# Patient Record
Sex: Female | Born: 1940 | Race: White | Hispanic: No | Smoking: Current every day smoker
Health system: Southern US, Community
[De-identification: ages and names within clinical notes are randomized; demographics above are authoritative.]

## PROBLEM LIST (undated history)

## (undated) DIAGNOSIS — K219 Gastro-esophageal reflux disease without esophagitis: Secondary | ICD-10-CM

## (undated) DIAGNOSIS — M1990 Unspecified osteoarthritis, unspecified site: Secondary | ICD-10-CM

## (undated) DIAGNOSIS — J449 Chronic obstructive pulmonary disease, unspecified: Secondary | ICD-10-CM

## (undated) DIAGNOSIS — F329 Major depressive disorder, single episode, unspecified: Secondary | ICD-10-CM

## (undated) DIAGNOSIS — I639 Cerebral infarction, unspecified: Secondary | ICD-10-CM

## (undated) DIAGNOSIS — F32A Depression, unspecified: Secondary | ICD-10-CM

## (undated) DIAGNOSIS — I509 Heart failure, unspecified: Secondary | ICD-10-CM

## (undated) HISTORY — PX: APPENDECTOMY: SHX54

## (undated) HISTORY — DX: Gastro-esophageal reflux disease without esophagitis: K21.9

## (undated) HISTORY — DX: Unspecified osteoarthritis, unspecified site: M19.90

## (undated) HISTORY — DX: Heart failure, unspecified: I50.9

## (undated) HISTORY — PX: BACK SURGERY: SHX140

## (undated) HISTORY — DX: Depression, unspecified: F32.A

## (undated) HISTORY — DX: Cerebral infarction, unspecified: I63.9

---
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
## 2000-07-18 ENCOUNTER — Emergency Department (HOSPITAL_COMMUNITY): Admission: EM | Admit: 2000-07-18 | Discharge: 2000-07-18 | Payer: Self-pay | Admitting: Emergency Medicine

## 2000-11-26 ENCOUNTER — Observation Stay (HOSPITAL_COMMUNITY): Admission: RE | Admit: 2000-11-26 | Discharge: 2000-11-27 | Payer: Self-pay | Admitting: Specialist

## 2000-11-26 ENCOUNTER — Encounter: Payer: Self-pay | Admitting: Specialist

## 2000-11-26 ENCOUNTER — Encounter (INDEPENDENT_AMBULATORY_CARE_PROVIDER_SITE_OTHER): Payer: Self-pay

## 2001-03-01 ENCOUNTER — Encounter: Payer: Self-pay | Admitting: Specialist

## 2001-03-01 ENCOUNTER — Encounter: Admission: RE | Admit: 2001-03-01 | Discharge: 2001-03-01 | Payer: Self-pay | Admitting: Specialist

## 2001-04-16 ENCOUNTER — Encounter (INDEPENDENT_AMBULATORY_CARE_PROVIDER_SITE_OTHER): Payer: Self-pay | Admitting: Specialist

## 2001-04-16 ENCOUNTER — Inpatient Hospital Stay (HOSPITAL_COMMUNITY): Admission: RE | Admit: 2001-04-16 | Discharge: 2001-04-19 | Payer: Self-pay | Admitting: Specialist

## 2001-04-16 ENCOUNTER — Encounter: Payer: Self-pay | Admitting: Specialist

## 2003-11-25 ENCOUNTER — Encounter: Admission: RE | Admit: 2003-11-25 | Discharge: 2003-11-25 | Payer: Self-pay | Admitting: Neurosurgery

## 2004-01-06 ENCOUNTER — Encounter: Admission: RE | Admit: 2004-01-06 | Discharge: 2004-01-06 | Payer: Self-pay | Admitting: Neurosurgery

## 2004-01-23 ENCOUNTER — Other Ambulatory Visit: Payer: Self-pay

## 2004-02-11 ENCOUNTER — Other Ambulatory Visit: Payer: Self-pay

## 2004-06-09 ENCOUNTER — Inpatient Hospital Stay (HOSPITAL_COMMUNITY): Admission: RE | Admit: 2004-06-09 | Discharge: 2004-06-14 | Payer: Self-pay | Admitting: Neurosurgery

## 2004-12-08 ENCOUNTER — Ambulatory Visit: Payer: Self-pay | Admitting: Internal Medicine

## 2005-01-04 ENCOUNTER — Ambulatory Visit: Payer: Self-pay

## 2005-01-16 ENCOUNTER — Ambulatory Visit: Payer: Self-pay

## 2005-03-06 ENCOUNTER — Ambulatory Visit: Payer: Self-pay | Admitting: Internal Medicine

## 2005-05-22 ENCOUNTER — Encounter: Admission: RE | Admit: 2005-05-22 | Discharge: 2005-06-22 | Payer: Self-pay | Admitting: Neurosurgery

## 2005-05-30 ENCOUNTER — Ambulatory Visit: Payer: Self-pay | Admitting: Internal Medicine

## 2005-06-02 ENCOUNTER — Emergency Department: Payer: Self-pay | Admitting: Emergency Medicine

## 2005-10-04 ENCOUNTER — Emergency Department (HOSPITAL_COMMUNITY): Admission: EM | Admit: 2005-10-04 | Discharge: 2005-10-04 | Payer: Self-pay | Admitting: Emergency Medicine

## 2006-05-03 ENCOUNTER — Emergency Department: Payer: Self-pay | Admitting: Emergency Medicine

## 2006-06-07 ENCOUNTER — Ambulatory Visit: Payer: Self-pay | Admitting: Internal Medicine

## 2006-07-21 ENCOUNTER — Other Ambulatory Visit: Payer: Self-pay

## 2006-07-21 ENCOUNTER — Inpatient Hospital Stay: Payer: Self-pay | Admitting: Internal Medicine

## 2006-10-28 ENCOUNTER — Emergency Department: Payer: Self-pay | Admitting: Unknown Physician Specialty

## 2006-10-28 ENCOUNTER — Other Ambulatory Visit: Payer: Self-pay

## 2006-11-07 ENCOUNTER — Emergency Department: Payer: Self-pay | Admitting: Emergency Medicine

## 2006-11-26 ENCOUNTER — Ambulatory Visit: Payer: Self-pay | Admitting: Internal Medicine

## 2006-12-12 ENCOUNTER — Ambulatory Visit: Payer: Self-pay | Admitting: Internal Medicine

## 2007-03-26 ENCOUNTER — Ambulatory Visit: Payer: Self-pay | Admitting: Neurosurgery

## 2007-07-23 ENCOUNTER — Ambulatory Visit: Payer: Self-pay | Admitting: Pain Medicine

## 2007-07-31 ENCOUNTER — Ambulatory Visit: Payer: Self-pay | Admitting: Pain Medicine

## 2007-08-21 ENCOUNTER — Ambulatory Visit: Payer: Self-pay | Admitting: Pain Medicine

## 2007-09-02 ENCOUNTER — Ambulatory Visit: Payer: Self-pay | Admitting: Pain Medicine

## 2007-09-18 ENCOUNTER — Ambulatory Visit: Payer: Self-pay | Admitting: Pain Medicine

## 2007-10-06 ENCOUNTER — Emergency Department: Payer: Self-pay | Admitting: Internal Medicine

## 2007-10-29 ENCOUNTER — Ambulatory Visit: Payer: Self-pay | Admitting: Pain Medicine

## 2007-11-06 ENCOUNTER — Ambulatory Visit: Payer: Self-pay | Admitting: Pain Medicine

## 2007-11-18 ENCOUNTER — Inpatient Hospital Stay: Payer: Self-pay | Admitting: Internal Medicine

## 2007-11-18 ENCOUNTER — Other Ambulatory Visit: Payer: Self-pay

## 2007-11-26 ENCOUNTER — Ambulatory Visit: Payer: Self-pay | Admitting: Pain Medicine

## 2008-02-14 ENCOUNTER — Ambulatory Visit: Payer: Self-pay | Admitting: Specialist

## 2008-02-21 ENCOUNTER — Ambulatory Visit: Payer: Self-pay | Admitting: Specialist

## 2008-02-24 ENCOUNTER — Ambulatory Visit: Payer: Self-pay | Admitting: Specialist

## 2008-02-24 ENCOUNTER — Other Ambulatory Visit: Payer: Self-pay

## 2008-02-26 ENCOUNTER — Ambulatory Visit: Payer: Self-pay | Admitting: Specialist

## 2008-03-03 ENCOUNTER — Emergency Department: Payer: Self-pay | Admitting: Emergency Medicine

## 2008-08-11 ENCOUNTER — Ambulatory Visit: Payer: Self-pay | Admitting: Internal Medicine

## 2009-01-03 ENCOUNTER — Emergency Department: Payer: Self-pay | Admitting: Emergency Medicine

## 2009-04-15 ENCOUNTER — Ambulatory Visit: Payer: Self-pay | Admitting: Anesthesiology

## 2009-07-13 ENCOUNTER — Emergency Department: Payer: Self-pay | Admitting: Unknown Physician Specialty

## 2009-07-15 ENCOUNTER — Emergency Department: Payer: Self-pay | Admitting: Unknown Physician Specialty

## 2009-11-06 ENCOUNTER — Emergency Department: Payer: Self-pay | Admitting: Emergency Medicine

## 2009-12-01 ENCOUNTER — Ambulatory Visit: Payer: Self-pay | Admitting: Internal Medicine

## 2011-02-24 NOTE — Op Note (Signed)
[facility]  Patient:    Jennifer Hayes, Jennifer Hayes             MRN: [id number] Proc. Date: 04/16/01 Adm. Date:  [id number] Attending:  Pierce Crane                           Operative Report  PREOPERATIVE DIAGNOSES:  Recurrent herniated nucleus pulposus L5-S1, left, lateral recessed stenosis and herniated nucleus pulposus L4-5, left.  PROCEDURE:  Redo microdiskectomy L5-S1, left, hemilaminotomy, microdiskectomy L4-5, left, hemilaminectomy l5, foraminotomies L5 and S1.  ANESTHESIA:  General.  ASSISTANT:  Kerrin Champagne, M.D.  BRIEF HISTORY:  A 70 year old with a history of diskectomy, recurrent S1, L5 radicular pain confirmed on myelogram and its filling of the 5 and S1 nerve roots. Extension indicated protrusion of the disk with stenosis. Operative intervention was indicated for decompression at 2 levels by diskectomy and lateral recess, decompression. The risks and benefits discussed including bleeding, infection, damage to neurovascular structures, CSF leakage, epidural fibrosis and need for fusion in the future, etc.  TECHNIQUE:  The patient in the supine position after induction of adequate general anesthesia and 1 gm of Kefzol IV. The patient placed prone on the Andrews frame, all bony prominences well padded, lumbar region prepped and draped in the usual sterile fashion. The previous surgical scar was excised. The excision was extended cephalad. First the lumbar fascia identified and divided in line with the skin incision. The paraspinous muscle elevated from the lamina of 4-5 and S1 on the left. McCullough retractor was placed. The operating microscope was draped and brought in the surgical field, a confirmatory radiograph obtained. Kochers on the 4 and 5 spinous processes. Care was utilized to detach the epidural fibrosis from the lamina of 5 and of S1. Small interlaminar space at 4-5. There was a small transverse remnant of the  neural arch at L5. A hemilaminotomy was performed at 4-5 with a large facet noted and a medial hemifacetectomy was performed utilizing the high speed bur removing the ligamentum flavum from the interspace with severe lateral recessed stenosis. Foraminotomy was performed completing the resection of the neural arch at L5. The lateral recess was further decompressed with a 2 mm Kerrison with the ligamentum flavum reduced as well. A Penfield 4 was utilized to identify the L5 nerve root and it was gently mobilized medially. The large HNP at 4-5 noted and soft. Annulotomy was performed and thorough diskectomy performed with no residual herniated disk material following the diskectomy. A hockey stick probed piercing the foramen of 4 and 5 and found to be widely patent. Next the lateral recess was decompressed at 5-1. Epidural fibrosis was mobilized utilizing a combination of the nerve hook and a small straight curette. The nerve root was identified and the foramen of S1 gently mobilized medially. There was an extruded fragment noted at 5-1 noted as well and annulotomy was performed. A copious portion of disk material was removed from the disk space and no evidence of residual disk material was noted. Both disk spaces were copiously irrigated. The nerve roots of 4, 5 and S1 were examined and found to be intact, erythematous, and edematous. Good mobilization following the diskectomy and the lateral recessed decompression. Bipolar electrocautery was utilized to achieve strict hemostasis. No evidence of CSF leakage or active bleeding. Thrombin soaked Gelfoam was placed in the laminotomy defects, bone wax was placed over the cancellous bone surfaces. The McCullough retractor was removed. Paraspinous  muscles inspected, no evidence of active bleed. A Penrose drain was placed and brought out through a separate stab wound in the skin. The dorsal lumbar fascia reapproximated with #1 Vicryl interrupted  figure-of-eight sutures. The subcutaneous tissue reapproximated with 2-0 Vicryl simple sutures. The skin was reapproximated with staples. The wound was dressed sterilely. The patient was placed supine on the hospital bed, extubated without difficulty and transported to the recovery room in satisfactory condition.  The patient tolerated the procedure well with no complications. Blood loss was 50 cc. DD:  04/16/01 TD:  04/17/01 Job: [id number]

## 2011-02-24 NOTE — H&P (Signed)
[facility]  Patient:    Jennifer Hayes, Jennifer Hayes               MRN: [id number] Adm. Date:  04/16/01 Attending:  Javier Docker, M.D. Dictator:   Druscilla Brownie. Shela Nevin, P.A.                         History and Physical  DATE OF BIRTH:  [birth year]  CHIEF COMPLAINT:  "Pain in my left leg."  HISTORY OF PRESENT ILLNESS:  A 70 year old lady who sustained an injury in the fall of 2001 when she was lifting a box. She had pain into her back and numbness into the left leg. She was eventually seen by [place]. Conservative care consisted of selective nerve root blocks as well as anti-inflammatories and analgesics. The patient underwent microdiskectomy on the left at L5-S1 on November 26, 2000. She did fairly well for a short time after her surgery but unfortunately her pain returned and became more intense into the left lower extremity. She now has developed with an antalgic gait and has a mood change. On the CT myelogram (active), the patient has disk protrusion at L4-5 with narrowing of the neural canal. Also noted to be a decreased filling of the left S1 nerve root. Due to these positive findings and the fact that unfortunately this patients pain has returned with the findings on CT myelogram and MRI, it was felt that she would benefit with surgical intervention with microdiskectomy at L4-5 with lateral recessed decompression at L4-5 and L5-S1.  PAST MEDICAL HISTORY:  The patients really been in good health. She had the microdiskectomy as mentioned above and in February of 2002, she had an appendectomy, exploratory laparotomy, and cervical fusion in the past.  CURRENT MEDICATIONS:  Vioxx and hydrocodone.  ALLERGIES:  She has some nausea with DARVON.  SOCIAL HISTORY:  The patient smokes about a pack of cigarettes per day (will try to cut back) and has 2-3 alcoholic beverages per month. She is married.  FAMILY HISTORY:  Noncontributory.  REVIEW OF  SYSTEMS:  CNS:  No seizure disorder, paralysis, numbness, or double vision. RESPIRATORY:  No shortness of breath, productive cough, hemoptysis. CARDIOVASCULAR:  No chest pain, no angina, no orthopnea. GASTROINTESTINAL:  No nausea, vomiting, melena, or bloody stools. GENITOURINARY:  No discharge, dysuria, or hematuria. MUSCULOSKELETAL:  Primarily with left leg pain and discomfort.  PHYSICAL EXAMINATION:  GENERAL:  Alert, cooperative, and friendly, somewhat anxious 70 year old white female whose vital signs are:  VITAL SIGNS:  Blood pressure 128/68, pulse 72, respirations 20. She is accompanied by her husband.  HEENT:  Normocephalic. Pupils equal round and reactive to light and accommodation. Extraocular movements intact. Oropharynx is clear.  CHEST:  Clear to auscultation. No rhonchi or rales.  HEART:  Regular rate and rhythm. No murmurs are heard.  ABDOMEN:  Soft and nontender. Liver and spleen are not felt.  GENITALIA/RECTAL/PELVIC/BREASTS:  Not done not pertinent to the present illness.  EXTREMITIES:  The patient has a positive straight leg raise on the left which produces buttock, posterior thigh and calf pain. It is exacerbated with dorsal augmentation maneuver. No weakness is noted.   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  PLAN:  The patient will undergo microdiskectomy at L4-5 with a lateral recessed decompressed decompression at L4-5, L5-S1. DD:  04/10/01 TD:  04/10/01 Job: [id number]

## 2011-02-24 NOTE — Discharge Summary (Signed)
Jennifer Hayes, SZE NO.:  [id number]   MEDICAL RECORD NO.:  [id number]          PATIENT TYPE:  INP   LOCATION:  3041                         FACILITY:  [facility]   PHYSICIAN:  Cristi Loron, M.D.DATE OF BIRTH:  [birth year]   DATE OF ADMISSION:  06/09/2004  DATE OF DISCHARGE:  06/14/2004                                 DISCHARGE SUMMARY   HISTORY OF PRESENT ILLNESS:  Briefly, the patient is a 70 year old white  female who was involved in a work-related injury suffering back and leg  pain.  She has failed medical management, was worked up with a lumbar MRI  which demonstrated the patient had degenerative disc disease at L4-L5 and L5-  S1.  She has failed extensive non surgical management and was treated with  multiple modalities.  The patient was further worked up with a lumbar  discogram which demonstrated she had concordant pain and degenerative disc  disease at L4-L5 and L5-S1.  I discussed the various treatment options with  the patient including surgery.  The patient has weighed the risks, benefits  and alternatives to surgery and she decided to proceed with a lumbar  decompression and fusion.  Patient has undergone previous surgery at L4-L5  and L5-S1 X2 by another physician.   For further details of this admission please refer to the typed history and  physical.   HOSPITAL COURSE:  I admitted the patient to [facility] on June 09, 2004 and that day performed an L4-L5 and L5-S1 fusion.  The surgery went  well without complications.  (For full details of this procedure, please  refer to the typed operative note).   Postoperatively, the patient's course was more-or-less unremarkable.  We did  initially have some trouble with pain control as she was narcotic-tolerant,  started on OxyContin 40 mg b.i.d. and her pain improved nicely.  We had  physical therapy and occupational therapy see the patient and by June 14, 2004 the patient was  afebrile, vital signs were stable, she was eating  well, ambulating well, wound was healing well without signs of infection.  Her pain was under adequate control with p.o. medications and she was  requesting discharge to home.   DISCHARGE INSTRUCTIONS:  Patient was given written discharge instructions  and is instructed to follow up with me in four weeks.   DISCHARGE MEDICATION PRESCRIPTIONS:  1.  OxyContin 20 mg #60, one p.o. b.i.d.  2.  Percocet 10/325 #50, one p.o. q.6h. PRN for break through pain.  3.  Valium 5 mg #50, one p.o. q.6h. PRN for muscle spasms.  4.  Patient is to resume her outpatient medical regimen.   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   PROCEDURE PERFORMED:  Re-do L4-L5 laminectomy; L4-L5 and L5-S1 posterior  lumbar interbody fusions, insertion of bilateral L4-L5 and L5-S1 interbody  prostheses (PEEK cages).  Posterior segmental instrumentation with Legacy  titanium pedicle screws and rods bilaterally, L4 to S1; posterior lateral  arthrodesis with local morselized Autograft bone and VTOS mode scaffolding.       JDJ/MEDQ  D:  07/07/2004  T:  07/08/2004  Job:  [id number]

## 2011-02-24 NOTE — H&P (Signed)
[facility]  Patient:    Jennifer Hayes, Jennifer Hayes                  MRN: [id number] Adm. Date:  11/26/00 Attending:  Javier Docker, M.D. Dictator:   Javier Docker, M.D.                         History and Physical  DATE OF OFFICE VISIT HISTORY AND PHYSICAL:  November 23, 2000.  CHIEF COMPLAINT:  Left buttock pain and leg pain.  HISTORY OF PRESENT ILLNESS:  Patient is a 70 year old female who has been seen and evaluated by Dr. Javier Docker for buttock pain and left lower extremity pain radiating down to the dorsum of her foot.  She was referred over per Dr. Caralyn Guile. Ramos.  She was initially injured at work lifting a 35-pound box and felt the acute onset of low back pain and numbness extending down into her leg.  After being seen in the emergency room, she was subsequently seen by Dr. Vania Rea. Supple and treated conservatively, underwent MRI which demonstrated degenerative disk disease at multiple levels in the lumbar spine and also some paracentral disk protrusion at L5-S1.  She was referred over to Dr. Ethelene Hal and underwent selective nerve root block which only gave her temporary relief.  She underwent intradiscal steroid injections without relief.  She has reported the pain has been worsening.  She was referred over and evaluated by Dr. Shelle Iron for possible surgical intervention. She was seen and evaluation, risks and benefits of the procedure have been discussed with the patient and she has elected to proceed with surgery.  ALLERGIES:  DARVON causes nausea.  CURRENT MEDICATIONS 1. Vicodin 5 mg p.r.n. pain. 2. Celebrex 200 mg. 3. Ambien 5 mg for sleep.  PAST MEDICAL HISTORY:  Essentially negative.  PAST SURGICAL HISTORY:  Cervical fusion; appendectomy; exploratory laparotomy.  FAMILY HISTORY:  Mother living with history of diabetes.  Father deceased, cause is unknown.  SOCIAL HISTORY:  Married.  Approximately two to three drinks a month  of alcohol, ________________.  One-pack-per-day smoker.  REVIEW OF SYSTEMS:  GENERAL:  No fevers, chills or night sweats.  NEUROLOGIC: No seizures, syncope or paralysis.  Patient does have some radiation of pain and paresthesias down the left lower extremity.  RESPIRATORY:  No shortness of breath, productive cough or hemoptysis.  Patient is a smoker of one pack per day.  CARDIOVASCULAR:  No chest pain, angina or orthopnea.  GI:  No nausea, vomiting, diarrhea or constipation.  GU:  No dysuria, hematuria or discharge. MUSCULOSKELETAL:  Pertinent to that of the back and leg found in the history of present illness.  PHYSICAL EXAMINATION  VITAL SIGNS:  Pulse 70, respirations 18, blood pressure 90/78.  GENERAL:  Patient is a 70 year old female, well-nourished, well-developed, and appears to be in no acute distress.  She is alert, oriented and cooperative.  HEENT:  Normocephalic, atraumatic.  Pupils are round and reactive.  Oropharynx is clear.  NECK:  Supple.  No carotid bruits.  CHEST:  Clear to auscultation and percussion.  No rhonchi or rales.  HEART:  Heart in regular rate and rhythm.  No murmurs.  S1 and S2 noted.  ABDOMEN:  Soft abdomen, slightly round, not protuberant.  Bowel sounds are present.  Nontender.  RECTAL:  Not done, not pertinent to present illness.  BREASTS:  Not done, not pertinent to present illness.  GENITALIA:  Not done, not pertinent  to present illness.  EXTREMITIES:  Pelvis is stable.  Nontender over the trochanter of the left hip.  Straight leg raise positive on the left and produces buttock and posterior thigh pain, exacerbated with dorsal augmentation maneuver.  EHL 5/5 on the left.  Slight decreased sensation on the dorsum of the foot.  Right leg straight leg raise is negative.  IMPRESSION:  Herniated nucleus pulposus, L5-S1.  PLAN:  Patient will be admitted to [facility] to undergo microdiskectomy and foraminotomy of L5-S1 on the left.   Surgery will be performed by Dr. Jene Every. DD:  11/28/00 TD:  11/29/00 Job: [id number]

## 2011-02-24 NOTE — Op Note (Signed)
[facility]  Patient:    Jennifer Hayes, Jennifer Hayes                  MRN: [id number] Proc. Date: 11/26/00 Adm. Date:  [id number] Disc. Date: [id number] Attending:  Pierce Crane                           Operative Report  PREOPERATIVE DIAGNOSIS:  Herniated nucleus pulposus, foraminal stenosis L5-S1 left.  POSTOPERATIVE DIAGNOSIS:  Herniated nucleus pulposus, foraminal stenosis L5-S1 left.  OPERATION: 1. Microdiskectomy L5-S1 left. 2. Foraminotomy L5 left.  SURGEON:  Javier Docker, M.D.  ASSISTANT:  Georges Lynch. Darrelyn Hillock, M.D.  ANESTHESIA:  General.  BRIEF HISTORY AND INDICATIONS:  A 70 year old with refractory S1 radicular pain and L5 radicular pain secondary to HNP and foraminal stenosis at L5-S1. Operative intervention was indicated for decompression by diskectomy and foraminotomy.  Risks and benefits of the procedure were discussed including bleeding, infection, damage to neurovascular structures, CSF leakage, epidural fibrosis, epidural fibrosis, no change in symptoms, etc.  TECHNIQUE:  The patient was placed in the supine position after induction of adequate general anesthesia with 1 g Kefzol for antibiotic prophylaxis.  The patient was placed prone on the [place] frame.  All bony prominences were well padded.  The lumbar region was prepped and draped in the usual sterile fashion.  One 18-gauge spinal needle was utilized to localize the L5-S1 interspace and confirmed with x-ray.   Electrocautery was utilized to achieve hemostasis.   Decision was then made after infiltration with 0.25% Marcaine with epinephrine in subcutaneous tissue from the spinous process of L5 to S1. Subcutaneous tissue was dissected.  The dorsal lumbar fascia was identified and divided in line with the skin incision.   McCullough retractor was placed. Paraspinous muscles were gently elevated from the lamina of L5-S1 on the left. Confirmatory radiograph obtained.   Operating microscope was draped and brought into the surgical field.  Hemilaminotomy of L5 was performed with a 2 mm and 3 mm Kerrison.   Ligamentum flavum was detached from the cephalad edge of S1 utilizing a straight curet.  This was removed from the interspace with protecting neural elements at all times.   An HNP that was noted laterally and into the foramen was noted.  Neural elements were well protected and gently mobilized medially.  Annulotomy was performed.  Copious portions of disk material was removed from the disk space with extension into the foramen. This was procured with a neural hook followed by a Kerrison.  There was extension of the 5 root noted as well.  With the 5 root protected, a foraminotomy was performed with the 2 mm Kerrison at L5 and at S1, a small portion of the superior articular facet which was narrowing L5 foramen. Bipolar cautery was utilized to achieve hemostasis.  The disk space was copiously irrigated.  Hockey stick probe placed in the foramen of 5 and S1 and found to be widely patent.  S1 nerve root inspected as well as the L5.  They were found to be without evidence of compression.  There was good mobility of the nerve roots noted following the decompression.  Again, wound copiously irrigated.  Inspection revealed no back bleeding or CSF leakage.  THe operating microscope was removed.  The McCullough retractor was removed. Paraspinous muscles inspected with no bleeding.  Thrombin-soaked Gelfoam was placed in laminotomy defect.  Dorsal lumbar fascia was reapproximated with #1 Vicryl in figure-of-eight sutures.  Subcutaneous tissue was reapproximated with 2-0 Vicryl sutures.  Skin was reapproximated with a 3-0 subcuticular Prolene reinforced with Steri-Strips and sterile dressing.  The patient was placed supine on the hospital bed, extubated without difficulty, and transported to the recovery room in satisfactory condition.  The patient tolerated the  procedure well without complication. DD:  11/26/00 TD:  11/27/00 Job: [id number]

## 2011-02-24 NOTE — Op Note (Signed)
[facility]  Patient:    Jennifer Hayes, Jennifer Hayes                  MRN: [id number] Proc. Date: 11/26/00 Adm. Date:  [id number] Disc. Date: [id number] Attending:  Pierce Crane                           Operative Report  PREOPERATIVE DIAGNOSES:  Herniated nucleus pulposus, foraminal stenosis L5-S1 left.  POSTOPERATIVE DIAGNOSES:  Herniated nucleus pulposus, foraminal stenosis L5-S1 left.  OPERATIVE PROCEDURE:  Microdiskectomy L5-S1 left, foraminotomy L5 left.  ANESTHESIA:  General.  ASSISTANT:  Georges Lynch. Darrelyn Hillock, M.D.  INDICATIONS:  70 year old with refractory S1 radicular pain and L5 radicular pain secondary to HNP and foraminal stenosis at L5-S1. Operative intervention was indicated for decompression by diskectomy and foraminotomy. The risks and benefits discussed including bleeding, infection, damage to vascular structures, CSF leak, attributorial fibrosis, no changes in symptoms, etc.  DESCRIPTION OF PROCEDURE:  With the patient in supine position and after induction of adequate general anesthesia, 1 g of Kefzol given for antimicrobial prophylaxis, the patient was placed prone on the Andrews frame and all bony prominences were well padded. The lumbar region was prepped and draped in the usual sterile fashion. One #18 gauge spinal needle was utilized to localize the L5-S1 inner space and confirmed with x-ray.  The incision was made from the spinous process of L5 to S1, the subcutaneous tissue dissected and electrocautery used to achieve hemostasis. Marcaine 0.25% with epinephrine was infiltrated in the subcutaneous tissue. The dorsolumbar fascia was identified and divided in line with the skin incision. A McCullough retractor retractor was placed, the paraspinous muscles were elevated to the lamina of L5 and S1. Confirmatory radiograph obtained. The operating microscope was draped and drawn into the surgical field. A hemilaminotomy  of L5 was performed with 2 and a 3 mm Kerrison. The ligamentum flavum was detached from the cephalad edge of S1 utilizing a straight curet. This was removed from the inner space with neuropaddies protecting the neural elements at all times. An HNP that was noted laterally and into the foramen was noted with the neural elements well protected. I gently mobilized medially. An annulotomy was performed. Copious coarse disk material was removed from the disk space with an extension into the foramen. This was procured with a nerve hook, followed by a Kerrison. There was an extension in the L5 root noted as well. With the L5 root protected, a foraminotomy was performed with a 2 mm Kerrison at L5 and at S1 with a small portion of the superior articuli and facet which was narrowing the L5 foramen. Bipolar cautery was utilized to achieve hemostasis. The disk space was copiously irrigated. A hockey stick probe was placed in the foramen of L5 and S1 and found to be widely patent. The S1 nerve root was inspected as well as the L5; they were found to be without evidence of compression. There was good mobility of the nerve roots noted following the decompression. Again, the wound was copiously irrigated and inspection revealed no evidence of active bleeding or CSF leakage. The operating microscope was removed, the McCullough retractor was removed and the paraspinous muscles were inspected with no evidence of active bleeding. Thrombin soaked Gelfoam was placed in the laminotomy defect. The dorsolumbar fascia reapproximated with #1 Vicryl in interrupted figure-of-eight sutures, the subcutaneous tissue reapproximated with 2-0 Vicryl simple sutures, and the skin was reapproximated with  3-0 subcuticular Prolene. The wound was reinforced with Steri-Strips and a sterile dressing. The patient was placed supine on the hospital bed, extubated without difficulty and transported to the recovery room in stable  condition. The patient tolerated the procedure well. There were no complications. DD:  11/26/00 TD:  11/27/00 Job: [id number]

## 2011-02-24 NOTE — Discharge Summary (Signed)
[facility]  Patient:    Jennifer Hayes, Jennifer Hayes             MRN: [id number] Adm. Date:  [id number] Disc. Date: [id number] Attending:  Pierce Crane Dictator:   Irena Cords, P.A.-C CC:         Javier Docker, M.D.   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  SECONDARY DIAGNOSES:  None.  SURGICAL PROCEDURE:  Redo micro discectomy L5-S1, left, hemilaminotomy, micro discectomy L4-L5, left, hemilaminectomy L5, foraminotomies L5 and S1 by Dr. Jene Every with the assistance of Dr. Otelia Sergeant on April 16, 2001. Please see operative summary for further details.  CONSULTATIONS:  None.  LABORATORY DATA:  Postoperative hemoglobin was 11.8, hematocrit 33.9 and then repeated later that day was 11.1 and 32.0, respectively. Her preoperative hemoglobin was 13.6. Preoperative PT and INR were 12.6 and 0.9, respectively. Chem 7 prior to surgery was all within normal limits with a sodium of 140 and a potassium of 5.0. Creatinine was 0.8. On postoperative day #1, basic metabolic profile was again within normal limits with a sodium of 138, potassium 3.7, and creatinine of 0.7. Urinalysis prior to surgery was all within normal limits.  SURGICAL PATHOLOGY:  Fragments of soft tissue consistent with fibrocartilage, clinically disc material.  CHIEF COMPLAINT:  Pain in my left leg.  HISTORY OF PRESENT ILLNESS:  Jennifer Hayes is a 70 year old female who had sustained an injury to her back in the fall of 2001 while she was lifting a box. She has undergone conservative treatment with various anti-inflammatory medications, analgesics, and selective nerve root blocks. She has not seen much relief. She underwent a micro discectomy on the left on L5-S1 in February 2002. She did well for a short period of time but then symptoms of pain did reoccur.  A CT myelogram did show recurrent disc protrusion L4-L5. Because of the patients continued discomfort and findings, it was recommended she may benefit from redo surgical intervention. Risk, benefits, and alternatives of surgery were discussed with the patient. She voiced an understanding and wished to proceed with surgery. Preoperative laboratory studies and signed consents were obtained.  HOSPITAL COURSE:  Following surgical procedure, the patient was taken to the PACU in stable condition and transferred to the orthopedic floor in good condition. She did well during her hospital stay. She was up with physical therapy on postoperative day #1, weightbearing as tolerated, and with back care. She did well and progressed well with therapy. She gained independence fairly quickly. She had some hypotension postoperatively. Her systolic pressures were in the 80s to 90s. She was asymptomatic, though, with these pressures. She was given a fluid challenge and really did not change her pressures all that much. It should be noted that her preoperative pressures were low as well with systolic pressures in the low 21H to around 100.  On postoperative day #1, her hemoglobin was ___. This remained as stable later in the day, as it was rechecked, it was ___. Incision was examined on postoperative day #2 and subsequently found to be clean, dry and intact without signs or symptoms of infection. The patient was initially on morphine IV for pain management and then switched over to Lorcet p.o. which she tolerated well and controlled her pain well. She was given six doses of Kefzol 1 g IV postoperatively. By postoperative day #3, she was stable and ready for discharge home.  DISCHARGE DISPOSITION:  The patient will be discharged home. She is to resume a regular diet. She is to followup with Dr. Shelle Iron in seven to ten days. She is to call [phone] for an appointment. Activity is weightbearing as tolerated. No  bending, twisting, stooping, or heavy lifting. Continue to follow back care as instructed by the therapist. Once daily dressing changes. May being showering on postoperative day #5.  DISCHARGE MEDICATIONS: 1. Vioxx 25 mo p.o. q.d. with food. 2. Robaxin 500 mg p.o. q.8h. p.r.n. spasm. 3. Lorcet 10/650 one p.o. q.4-6h. p.r.n. pain. 4. Vitamin C 500 mg p.o. q.d.  CONDITION ON DISCHARGE:  Good and improved. DD:  04/19/01 TD:  04/19/01 Job: [id number]

## 2011-02-24 NOTE — Op Note (Signed)
NAME:  Jennifer Hayes, Jennifer Hayes                ACCOUNT NO.:  [id number]   MEDICAL RECORD NO.:  [id number]                   PATIENT TYPE:  INP   LOCATION:  3041                                 FACILITY:  [facility]   PHYSICIAN:  Cristi Loron, M.D.            DATE OF BIRTH:  1941/02/07   DATE OF PROCEDURE:  06/09/2004  DATE OF DISCHARGE:                                 OPERATIVE REPORT   BRIEF HISTORY:  The patient is a 70 year old white female who was involved  in a work related injury suffering back and leg pain.  She failed medical  management and was worked up with a lumbar MRI which demonstrated that the  patient had degenerative disk disease at L4-5 and L5-S1.  She failed  extensive non-surgical management and was treated with multiple modalities.  The patient was further worked up with a lumbar diskogram which demonstrated  that she had concordant pain and degenerated disk at L4-5 and L5-S1.  I  discussed the various treatment options with the patient including surgery.  The patient has weighed the risks, benefits and alternatives of surgery and  decided to proceed with a lumbar decompression and fusion.  The patient has  undergone previous surgery at L4-5 and L5-S1 times two by another physician.   PREOPERATIVE DIAGNOSIS:  L4-5 and L5-S1 degenerative disk disease, lumbago  with lumbar radiculopathy.   POSTOPERATIVE DIAGNOSIS:  L4-5 and L5-S1 degenerative disk disease, lumbago  with lumbar radiculopathy.   PROCEDURE:  Redo laminectomy at L4 and L5; L4-5 and L5-S1 posterior lumbar  interbody fusion; insertion of bilateral L4-5 and L5-S1 interbody prostheses  (Capstone Peek cages); posterior segmental instrumentation with Legacy  titanium peg screws and rods bilaterally L4 to S1; posterolateral  arthrodesis with local morselized autograft bone and Vitoss bone  scaffolding.   SURGEON:  Cristi Loron, M.D.   ASSISTANT:  Danae Orleans. Venetia Maxon, M.D.   ANESTHESIA:  General  endotracheal anesthesia.   ESTIMATED BLOOD LOSS:  500 mL.   SPECIMENS:  None.   DRAINS:  None.   COMPLICATIONS:  None.   DESCRIPTION OF PROCEDURE:  The patient was brought to the operating room by  the anesthesia team and general endotracheal anesthesia was induced.  The  patient was turned to the prone position on the Wilson frame and the  lumbosacral region was then prepared with Betadine scrub and Betadine  solution.  Sterile drapes were applied.  I then injected the area to be  incised with Marcaine with epinephrine solution and used a scalpel to make a  linear midline incision at the lumbosacral junction ellipsing out the  patient's prior surgical scar.  I used electrocautery to perform a bilateral  subperiosteal dissection exposing the spinous process and lamina of L3, 4, 5  and upper sacrum. I inserted a Versatack retractor for exposure and I  obtained an intraoperative radiograph to confirm our location.   We began on the left side, i.e., the side she previously had surgery  on.  We  used a high speed drill to expose the L4 and L5 lamina.  We used the high  speed drill to extend the prior laminotomy in a cephalad direction until we  encountered some relatively non-scarred down epidural space.  We then  performed the L4 and L5 laminectomy with a Kerrison punch removing the L3-4,  4-5 and 5-1 ligamentum flavum (what was left of it) and the epidural scar  tissue.  We then performed generous foraminotomies about the bilateral L4,  L5 and S1 nerve root.  We carefully freed up the thecal sac from the  epidural tissue and the scar tissue on the left and then carefully retracted  all structures out of harm's way with the retractor, we incised the L4-5 and  L5-S1 intervertebral disk and performed an aggressive diskectomy  bilaterally.   We then prepared the vertebral end plates for a posterior lumbar interbody  fusion by distracting the contralateral interspace with the  intervertebral  body spreader and then we created ipsilateral disk space of soft tissue  using the Epstein curettes and the hook curettes.  We had inserted an 8 x 26  mm Capstone Peek cage into the distracted L5-S1 interspace and then removed  the structure from the contralateral side and placed another similar sized  Capstone cage into the contralateral side.  In a similar fashion, we placed  10 x 26 mm Capstone Peek cages in at L4-5 after distracting the interspace.  Of course, we did this after retracting all structures out of harm's way.  We had pre-packed the cages with local morselized autograft bone and the  Vitoss bone scaffolding as well as in between lateral to the cages  completing the posterior interbody fusion.   We now turned our attention to the posterior segmental instrumentation. We  used the Versatack retractor to expose the lateral transverse process of L4-  5 and the upper sacrum. We then under fluoroscopic guidance cannulated the  bilateral L4, 5 and S1 pedicles with the bone probes.  We then tapped the  pedicles and tried to tap the pedicles to avoid cortical breaches and then  inserted 6.5 x 45 mm pedicle screws bilaterally at L4 and L5, 6.5 x 40 mm  pedicle screws bilaterally at S1.  We did this under fluoroscopic guidance  and then we palpated along the medial aspect of the bilateral L4-5 and S1  pedicles and noted there were no cortical breaches at the L4, 5 and S1 nerve  roots were not injured in any way. We then connected all the pedicle screws  with a lordotic rod.  We then placed the cross connector.  We compressed the  construct and then secured the rod with the appropriate caps completing the  instrumentation.   We now turned our attention to the posterolateral arthrodesis. We used a  high speed drill to decorticate the remainder of the L4-5 and L5-S1 facet joints, pars and transverse processes.  We laid a combination of local  morselized autograft bone  and Vitoss scaffolding over these decorticated  posterolateral structures completing the posterolateral arthrodesis.   We then obtained meticulous hemostasis using bipolar electrocautery.  We  inspected the thecal sac and bilateral L4, 5 and S1 nerve roots and noted  that there was no neural compression. We removed the Versatack retractor and  then reapproximated the thoracolumbar fascia with interrupted #1 Vicryl  suture, the subcutaneous tissues with interrupted 2-0 Vicryl suture and the  skin with Steri-Strips and Benzoin.  The wound was then covered with  Bacitracin ointment, a sterile dressing was applied and the drape was  removed.  The patient was subsequently returned to the supine position where  she was extubated by the anesthesia team and transported to the post-  anesthesia care unit in stable condition. All sponge, instrument and needle  counts were correct at the end of the case.                                               Cristi Loron, M.D.    JDJ/MEDQ  D:  06/09/2004  T:  06/10/2004  Job:  [id number]

## 2011-04-30 ENCOUNTER — Inpatient Hospital Stay: Payer: Self-pay | Admitting: Internal Medicine

## 2012-08-14 ENCOUNTER — Ambulatory Visit: Payer: Self-pay | Admitting: Internal Medicine

## 2013-02-11 ENCOUNTER — Emergency Department: Payer: Self-pay | Admitting: Emergency Medicine

## 2013-03-11 ENCOUNTER — Emergency Department: Payer: Self-pay | Admitting: Emergency Medicine

## 2013-03-11 LAB — URINALYSIS, COMPLETE
Bacteria: NONE SEEN
Blood: NEGATIVE
Nitrite: NEGATIVE
RBC,UR: 1 /HPF (ref 0–5)
Specific Gravity: 1.019 (ref 1.003–1.030)
Squamous Epithelial: NONE SEEN

## 2013-03-11 LAB — CBC
HCT: 34.6 % — ABNORMAL LOW (ref 35.0–47.0)
HGB: 12.1 g/dL (ref 12.0–16.0)
MCH: 30.3 pg (ref 26.0–34.0)
MCHC: 34.8 g/dL (ref 32.0–36.0)
MCV: 87 fL (ref 80–100)
RBC: 3.98 10*6/uL (ref 3.80–5.20)

## 2013-03-11 LAB — COMPREHENSIVE METABOLIC PANEL
Albumin: 3.3 g/dL — ABNORMAL LOW (ref 3.4–5.0)
Anion Gap: 5 — ABNORMAL LOW (ref 7–16)
Chloride: 110 mmol/L — ABNORMAL HIGH (ref 98–107)
EGFR (Non-African Amer.): >60
Potassium: 3.5 mmol/L (ref 3.5–5.1)
SGOT(AST): 16 U/L (ref 15–37)
Sodium: 143 mmol/L (ref 136–145)

## 2013-03-11 LAB — TROPONIN I: Troponin-I: <0.02 ng/mL

## 2013-07-11 ENCOUNTER — Ambulatory Visit: Payer: Self-pay

## 2013-11-27 ENCOUNTER — Ambulatory Visit: Payer: Self-pay

## 2013-12-06 ENCOUNTER — Emergency Department: Payer: Self-pay | Admitting: Emergency Medicine

## 2014-11-20 DIAGNOSIS — M5030 Other cervical disc degeneration, unspecified cervical region: Secondary | ICD-10-CM | POA: Insufficient documentation

## 2015-06-06 ENCOUNTER — Other Ambulatory Visit: Payer: Self-pay

## 2015-06-06 ENCOUNTER — Encounter: Payer: Self-pay | Admitting: Emergency Medicine

## 2015-06-06 ENCOUNTER — Emergency Department
Admission: EM | Admit: 2015-06-06 | Discharge: 2015-06-06 | Disposition: A | Discharge status: Home / Self Care | Payer: Medicare Other | Attending: Emergency Medicine | Admitting: Emergency Medicine

## 2015-06-06 ENCOUNTER — Emergency Department: Payer: Medicare Other

## 2015-06-06 DIAGNOSIS — Z880 Allergy status to penicillin: Secondary | ICD-10-CM | POA: Insufficient documentation

## 2015-06-06 DIAGNOSIS — M5412 Radiculopathy, cervical region: Secondary | ICD-10-CM | POA: Diagnosis not present

## 2015-06-06 DIAGNOSIS — Z720 Tobacco use: Secondary | ICD-10-CM | POA: Diagnosis not present

## 2015-06-06 DIAGNOSIS — M25512 Pain in left shoulder: Secondary | ICD-10-CM | POA: Diagnosis present

## 2015-06-06 HISTORY — DX: Chronic obstructive pulmonary disease, unspecified: J44.9

## 2015-06-06 LAB — CBC
HEMATOCRIT: 39.9 % (ref 35.0–47.0)
HEMOGLOBIN: 13.2 g/dL (ref 12.0–16.0)
MCH: 28.9 pg (ref 26.0–34.0)
MCHC: 33.1 g/dL (ref 32.0–36.0)
MCV: 87.3 fL (ref 80.0–100.0)
Platelets: 192 10*3/uL (ref 150–440)
RBC: 4.57 MIL/uL (ref 3.80–5.20)
RDW: 13.3 % (ref 11.5–14.5)
WBC: 11 10*3/uL (ref 3.6–11.0)

## 2015-06-06 LAB — BASIC METABOLIC PANEL
ANION GAP: 9 (ref 5–15)
BUN: 16 mg/dL (ref 6–20)
CHLORIDE: 103 mmol/L (ref 101–111)
CO2: 28 mmol/L (ref 22–32)
Calcium: 9.3 mg/dL (ref 8.9–10.3)
Creatinine, Ser: 0.85 mg/dL (ref 0.44–1.00)
GFR calc Af Amer: >60 mL/min (ref >60)
GLUCOSE: 112 mg/dL — AB (ref 65–99)
POTASSIUM: 3.8 mmol/L (ref 3.5–5.1)
Sodium: 140 mmol/L (ref 135–145)

## 2015-06-06 LAB — TROPONIN I: Troponin I: <0.03 ng/mL (ref <0.031)

## 2015-06-06 MED ORDER — PREDNISONE 10 MG PO TABS: ORAL_TABLET | ORAL | Status: AC

## 2015-06-06 MED ORDER — OXYCODONE-ACETAMINOPHEN 5-325 MG PO TABS
1.0000 | ORAL_TABLET | Freq: Once | ORAL | Status: AC
  Administered 2015-06-06: 1 via ORAL
  Filled 2015-06-06: qty 1

## 2015-06-06 MED ORDER — OXYCODONE-ACETAMINOPHEN 5-325 MG PO TABS: 1.0000 | ORAL_TABLET | ORAL | Status: DC | PRN

## 2015-06-06 MED ORDER — ONDANSETRON 4 MG PO TBDP
4.0000 mg | ORAL_TABLET | Freq: Once | ORAL | Status: AC
  Administered 2015-06-06: 4 mg via ORAL
  Filled 2015-06-06: qty 1

## 2015-06-06 MED ORDER — GABAPENTIN 300 MG PO CAPS
300.0000 mg | ORAL_CAPSULE | Freq: Once | ORAL | Status: AC
  Administered 2015-06-06: 300 mg via ORAL

## 2015-06-06 MED ORDER — GABAPENTIN 600 MG PO TABS: 300.0000 mg | ORAL_TABLET | Freq: Once | ORAL | Status: DC

## 2015-06-06 MED ORDER — GABAPENTIN 300 MG PO CAPS
ORAL_CAPSULE | ORAL | Status: AC
  Administered 2015-06-06: 300 mg
  Administered 2015-06-06: 300 mg via ORAL
  Filled 2015-06-06: qty 1

## 2015-06-06 NOTE — Discharge Instructions (Signed)
Please seek medical attention for any high fevers, chest pain, shortness of breath, change in behavior, persistent vomiting, bloody stool or any other new or concerning symptoms. ° ° °Cervical Radiculopathy °Cervical radiculopathy happens when a nerve in the neck is pinched or bruised by a slipped (herniated) disk or by arthritic changes in the bones of the cervical spine. This can occur due to an injury or as part of the normal aging process. Pressure on the cervical nerves can cause pain or numbness that runs from your neck all the way down into your arm and fingers. °CAUSES  °There are many possible causes, including: °· Injury. °· Muscle tightness in the neck from overuse. °· Swollen, painful joints (arthritis). °· Breakdown or degeneration in the bones and joints of the spine (spondylosis) due to aging. °· Bone spurs that may develop near the cervical nerves. °SYMPTOMS  °Symptoms include pain, weakness, or numbness in the affected arm and hand. Pain can be severe or irritating. Symptoms may be worse when extending or turning the neck. °DIAGNOSIS  °Your caregiver will ask about your symptoms and do a physical exam. He or she may test your strength and reflexes. X-rays, CT scans, and MRI scans may be needed in cases of injury or if the symptoms do not go away after a period of time. Electromyography (EMG) or nerve conduction testing may be done to study how your nerves and muscles are working. °TREATMENT  °Your caregiver may recommend certain exercises to help relieve your symptoms. Cervical radiculopathy can, and often does, get better with time and treatment. If your problems continue, treatment options may include: °· Wearing a soft collar for short periods of time. °· Physical therapy to strengthen the neck muscles. °· Medicines, such as nonsteroidal anti-inflammatory drugs (NSAIDs), oral corticosteroids, or spinal injections. °· Surgery. Different types of surgery may be done depending on the cause of your  problems. °HOME CARE INSTRUCTIONS  °· Put ice on the affected area. °¨ Put ice in a plastic bag. °¨ Place a towel between your skin and the bag. °¨ Leave the ice on for 15-20 minutes, 03-04 times a day or as directed by your caregiver. °· If ice does not help, you can try using heat. Take a warm shower or bath, or use a hot water bottle as directed by your caregiver. °· You may try a gentle neck and shoulder massage. °· Use a flat pillow when you sleep. °· Only take over-the-counter or prescription medicines for pain, discomfort, or fever as directed by your caregiver. °· If physical therapy was prescribed, follow your caregiver's directions. °· If a soft collar was prescribed, use it as directed. °SEEK IMMEDIATE MEDICAL CARE IF:  °· Your pain gets much worse and cannot be controlled with medicines. °· You have weakness or numbness in your hand, arm, face, or leg. °· You have a high fever or a stiff, rigid neck. °· You lose bowel or bladder control (incontinence). °· You have trouble with walking, balance, or speaking. °MAKE SURE YOU:  °· Understand these instructions. °· Will watch your condition. °· Will get help right away if you are not doing well or get worse. °Document Released: 06/20/2001 Document Revised: 12/18/2011 Document Reviewed: 05/09/2011 °[employer]® Patient Information ©2015 [employer]. This information is not intended to replace advice given to you by your health care provider. Make sure you discuss any questions you have with your health care provider. ° °

## 2015-06-06 NOTE — ED Provider Notes (Signed)
[facility] Emergency Department Provider Note   ____________________________________________  Time seen: 1735  I have reviewed the triage vital signs and the nursing notes.   HISTORY  Chief Complaint Shoulder Pain and Nausea   History limited by: Not Limited   HPI Jennifer Hayes is a 74 y.o. female who presents to the emergency department today because of concerns for left shoulder and arm pain. The patient states that the pain started roughly 26 hours ago. She states she was pulling close when it started. She describes pain as being a dull ache in her left shoulder. There are times when it is sharp. She states the pain does sometimes shoot down her arm. In addition she feels pins and needles down her left arm occasionally as if it was asleep. She does not recall any trauma to the arm. States she has not had similar symptoms in the past. Denies any chest pain, shortness breath, fevers.   Past Medical History  Diagnosis Date  . COPD (chronic obstructive pulmonary disease)     There are no active problems to display for this patient.   Past Surgical History  Procedure Laterality Date  . Back surgery      No current outpatient prescriptions on file.  Allergies Penicillins  No family history on file.  Social History Social History  Substance Use Topics  . Smoking status: Current Some Day Smoker  . Smokeless tobacco: None  . Alcohol Use: No    Review of Systems  Constitutional: Negative for fever. Cardiovascular: Negative for chest pain. Respiratory: Negative for shortness of breath. Gastrointestinal: Negative for abdominal pain, vomiting and diarrhea. Genitourinary: Negative for dysuria. Musculoskeletal: positive for left arm pain  Skin: Negative for rash. Neurological: Negative for headaches, focal weakness or numbness.   10-point ROS otherwise negative.  ____________________________________________   PHYSICAL  EXAM:  VITAL SIGNS: ED Triage Vitals  Enc Vitals Group     BP 06/06/15 1650 110/68 mmHg     Pulse Rate 06/06/15 1650 102     Resp 06/06/15 1650 18     Temp 06/06/15 1650 98.4 F (36.9 C)     Temp Source 06/06/15 1650 Oral     SpO2 06/06/15 1650 95 %     Weight 06/06/15 1650 165 lb (74.844 kg)     Height 06/06/15 1650  (1.626 m)     Head Cir --      Peak Flow --      Pain Score 06/06/15 1654 9   Constitutional: Alert and oriented. appears in mild discomfort  Eyes: Conjunctivae are normal. PERRL. Normal extraocular movements. ENT   Head: Normocephalic and atraumatic.   Nose: No congestion/rhinnorhea.   Mouth/Throat: Mucous membranes are moist.   Neck: No stridor. Hematological/Lymphatic/Immunilogical: No cervical lymphadenopathy. Cardiovascular: Normal rate, regular rhythm.  No murmurs, rubs, or gallops. Respiratory: Normal respiratory effort without tachypnea nor retractions. Breath sounds are clear and equal bilaterally. No wheezes/rales/rhonchi. Gastrointestinal: Soft and nontender. No distention.  Genitourinary: Deferred Musculoskeletal: tender to palpation about the left trapezius muscle. Tender mist to range of motion of the shoulder above the head. Strength 5 out of 5 in the left arm. Sensation grossly intact. No deformity to the shoulder.  Neurologic:  Normal speech and language. No gross focal neurologic deficits are appreciated. Speech is normal.  Skin:  Skin is warm, dry and intact. No rash noted. Psychiatric: Mood and affect are normal. Speech and behavior are normal. Patient exhibits appropriate insight and judgment.  ____________________________________________  LABS (pertinent positives/negatives)  Labs Reviewed  BASIC METABOLIC PANEL - Abnormal; Notable for the following:    Glucose, Bld 112 (*)    All other components within normal limits  CBC  TROPONIN I     ____________________________________________   EKG  I, Phineas Semen,  attending physician, personally viewed and interpreted this EKG  EKG Time: 1652 Rate: 100 Rhythm: Sinus tachycardia Axis: normal Intervals: qtc 438 QRS: narrow ST changes: no st elevation  ____________________________________________    RADIOLOGY  CXR IMPRESSION: No evidence of acute cardiopulmonary disease.  I, Kareen Jefferys, personally viewed and evaluated these images (plain radiographs) as part of my medical decision making.    ____________________________________________   PROCEDURES  Procedure(s) performed: None  Critical Care performed: No  ____________________________________________   INITIAL IMPRESSION / ASSESSMENT AND PLAN / ED COURSE  Pertinent labs & imaging results that were available during my care of the patient were reviewed by me and considered in my medical decision making (see chart for details).  Patient presents to the emergency department today with left shoulder and left arm pain. She is tender to palpation of the left trapezius. There is some tenderness when I elevate the left arm. Given her symptoms and clinical story and exam I do think cervical radiculopathy as most likely. Chest x-ray didn't show any obvious pneumonia or osseous injury. No vascular patient was intact and no other deficits. This time I do not think any emergent imaging of the spine is necessary. Will discharge home with steroids, pain medications and follow-up with primary care doctor.  ____________________________________________   FINAL CLINICAL IMPRESSION(S) / ED DIAGNOSES  Final diagnoses:  Cervical radiculopathy     Phineas Semen, MD 06/06/15 1935

## 2015-06-06 NOTE — ED Notes (Signed)
Pt here for left shoulder pain and nausea.  Pain started yesterday.

## 2015-06-06 NOTE — ED Notes (Signed)
Pt reporting L shoulder pain 9/10, started 4-5 a.m.,  radiating down arm and hand with numbness and tingling. Reports nausea and vomiting x1 last night.

## 2015-06-06 NOTE — ED Notes (Signed)
Patient transported to X-ray 

## 2015-09-10 DIAGNOSIS — M170 Bilateral primary osteoarthritis of knee: Secondary | ICD-10-CM | POA: Insufficient documentation

## 2015-09-10 DIAGNOSIS — M222X2 Patellofemoral disorders, left knee: Secondary | ICD-10-CM | POA: Insufficient documentation

## 2015-09-10 DIAGNOSIS — M222X1 Patellofemoral disorders, right knee: Secondary | ICD-10-CM | POA: Insufficient documentation

## 2016-02-04 DIAGNOSIS — M12811 Other specific arthropathies, not elsewhere classified, right shoulder: Secondary | ICD-10-CM | POA: Insufficient documentation

## 2016-11-23 DIAGNOSIS — E669 Obesity, unspecified: Secondary | ICD-10-CM | POA: Insufficient documentation

## 2016-11-23 DIAGNOSIS — J431 Panlobular emphysema: Secondary | ICD-10-CM | POA: Insufficient documentation

## 2016-11-23 DIAGNOSIS — Z8673 Personal history of transient ischemic attack (TIA), and cerebral infarction without residual deficits: Secondary | ICD-10-CM | POA: Insufficient documentation

## 2016-11-23 DIAGNOSIS — G4700 Insomnia, unspecified: Secondary | ICD-10-CM | POA: Insufficient documentation

## 2016-11-23 DIAGNOSIS — K219 Gastro-esophageal reflux disease without esophagitis: Secondary | ICD-10-CM | POA: Insufficient documentation

## 2017-01-01 DIAGNOSIS — E538 Deficiency of other specified B group vitamins: Secondary | ICD-10-CM | POA: Insufficient documentation

## 2017-01-04 DIAGNOSIS — L299 Pruritus, unspecified: Secondary | ICD-10-CM | POA: Insufficient documentation

## 2017-09-03 DIAGNOSIS — G894 Chronic pain syndrome: Secondary | ICD-10-CM | POA: Insufficient documentation

## 2017-09-03 DIAGNOSIS — M5416 Radiculopathy, lumbar region: Secondary | ICD-10-CM | POA: Insufficient documentation

## 2017-09-03 DIAGNOSIS — M961 Postlaminectomy syndrome, not elsewhere classified: Secondary | ICD-10-CM | POA: Insufficient documentation

## 2017-10-17 DIAGNOSIS — E7849 Other hyperlipidemia: Secondary | ICD-10-CM | POA: Insufficient documentation

## 2017-10-31 DIAGNOSIS — Z9981 Dependence on supplemental oxygen: Secondary | ICD-10-CM | POA: Insufficient documentation

## 2018-05-09 DIAGNOSIS — G8929 Other chronic pain: Secondary | ICD-10-CM | POA: Insufficient documentation

## 2018-05-09 DIAGNOSIS — M545 Low back pain, unspecified: Secondary | ICD-10-CM | POA: Insufficient documentation

## 2018-05-09 DIAGNOSIS — M519 Unspecified thoracic, thoracolumbar and lumbosacral intervertebral disc disorder: Secondary | ICD-10-CM | POA: Insufficient documentation

## 2018-05-09 DIAGNOSIS — M9979 Connective tissue and disc stenosis of intervertebral foramina of abdomen and other regions: Secondary | ICD-10-CM | POA: Insufficient documentation

## 2018-11-04 DIAGNOSIS — N390 Urinary tract infection, site not specified: Secondary | ICD-10-CM | POA: Insufficient documentation

## 2018-12-26 DIAGNOSIS — I10 Essential (primary) hypertension: Secondary | ICD-10-CM | POA: Insufficient documentation

## 2019-04-07 DIAGNOSIS — F17210 Nicotine dependence, cigarettes, uncomplicated: Secondary | ICD-10-CM | POA: Insufficient documentation

## 2019-04-07 DIAGNOSIS — F325 Major depressive disorder, single episode, in full remission: Secondary | ICD-10-CM | POA: Insufficient documentation

## 2019-06-02 ENCOUNTER — Encounter: Payer: Self-pay | Admitting: Adult Health

## 2019-06-02 ENCOUNTER — Ambulatory Visit (INDEPENDENT_AMBULATORY_CARE_PROVIDER_SITE_OTHER): Payer: Medicare Other | Admitting: Adult Health

## 2019-06-02 ENCOUNTER — Other Ambulatory Visit: Payer: Self-pay

## 2019-06-02 VITALS — BP 116/60 | HR 93 | Resp 16 | Ht 63.0 in | Wt 192.0 lb

## 2019-06-02 DIAGNOSIS — F339 Major depressive disorder, recurrent, unspecified: Secondary | ICD-10-CM

## 2019-06-02 DIAGNOSIS — M159 Polyosteoarthritis, unspecified: Secondary | ICD-10-CM

## 2019-06-02 DIAGNOSIS — M150 Primary generalized (osteo)arthritis: Secondary | ICD-10-CM

## 2019-06-02 DIAGNOSIS — J449 Chronic obstructive pulmonary disease, unspecified: Secondary | ICD-10-CM | POA: Diagnosis not present

## 2019-06-02 DIAGNOSIS — K219 Gastro-esophageal reflux disease without esophagitis: Secondary | ICD-10-CM

## 2019-06-02 DIAGNOSIS — M5440 Lumbago with sciatica, unspecified side: Secondary | ICD-10-CM

## 2019-06-02 DIAGNOSIS — G8929 Other chronic pain: Secondary | ICD-10-CM

## 2019-06-02 DIAGNOSIS — F329 Major depressive disorder, single episode, unspecified: Secondary | ICD-10-CM | POA: Insufficient documentation

## 2019-06-02 MED ORDER — TRAMADOL HCL 50 MG PO TABS: 50.0000 mg | ORAL_TABLET | Freq: Two times a day (BID) | ORAL | 0 refills | Status: AC

## 2019-06-02 NOTE — Progress Notes (Addendum)
[facility] [address] [place], [place] [address]  Internal MEDICINE  Office Visit Note  Patient Name: Jennifer Hayes  [id number]/2041  [id number]  Date of Service: 06/02/2019   Complaints/HPI Pt is here for establishment of PCP. Chief Complaint  Patient presents with  . Medical Management of Chronic Issues     re establish care, pt is wanting a motorized chair knee and back pain unable to walk long distance   . Gastroesophageal Reflux   HPI Pt is here to establish care. She is a well appearing 78 yo female.  She has a history significant for chronic back pain, osteoarthritis, copd and gerd.  She currently has her copd under control with combivent and duonebs.  Her back pain and osteoarthritis are very bad, and the patient is requesting pain medication. She is also stating that she has trouble getting around in her house, due to her pain and feels that a motorized wheelchair would improve her quality of life. She is mentally and physically capable of operating a motorized chair.   She had difficulty getting her ADLs completed due to pain with mobility.  Her pain scale is always above an 8, and she can not use a walker or can because standing up causes pain dispite a cane or walker. She can not use a POV due to weakness in her arms.   She is unable to cook or clean, because she can not stand with a walker, and a power chair would be helpful with this.    Current Medication: Outpatient Encounter Medications as of 06/02/2019  Medication Sig  . Cholecalciferol (VITAMIN D3) 125 MCG (5000 UT) CAPS Take by mouth.  . citalopram (CELEXA) 40 MG tablet Take by mouth.  . cyclobenzaprine (FLEXERIL) 10 MG tablet Take by mouth.  . esomeprazole (NEXIUM) 40 MG capsule Take by mouth.  . hydrOXYzine (VISTARIL) 25 MG capsule TK 1 C PO TID PRN FOR ITCHING  . ibuprofen (ADVIL) 800 MG tablet Take by mouth.  . Ipratropium-Albuterol (COMBIVENT) 20-100 MCG/ACT AERS respimat Inhale into the  lungs.  Marland Kitchen. ipratropium-albuterol (DUONEB) 0.5-2.5 (3) MG/3ML SOLN Take 3 mLs by nebulization 2 (two) times a day as needed.  . Misc. Devices MISC Portable oxygen  . nitroGLYCERIN (NITROSTAT) 0.4 MG SL tablet Place under the tongue.  . traMADol (ULTRAM) 50 MG tablet Take 1 tablet (50 mg total) by mouth 2 (two) times daily for 5 days.  . traZODone (DESYREL) 50 MG tablet Take by mouth.  . [DISCONTINUED] traMADol (ULTRAM) 50 MG tablet Take by mouth.  . fluticasone furoate-vilanterol (BREO ELLIPTA) 200-25 MCG/INH AEPB Inhale into the lungs.  Marland Kitchen. levocetirizine (XYZAL) 5 MG tablet Take by mouth.  . [DISCONTINUED] citalopram (CELEXA) 20 MG tablet Take by mouth.  . [DISCONTINUED] diclofenac sodium (VOLTAREN) 1 % GEL Place onto the skin.  . [DISCONTINUED] meloxicam (MOBIC) 7.5 MG tablet Take by mouth.  . [DISCONTINUED] nicotine (NICODERM CQ - DOSED IN MG/24 HOURS) 21 mg/24hr patch Place onto the skin.  . [DISCONTINUED] oxyCODONE-acetaminophen (ROXICET) 5-325 MG per tablet Take 1 tablet by mouth every 4 (four) hours as needed for severe pain. (Patient not taking: Reported on 06/02/2019)   No facility-administered encounter medications on file as of 06/02/2019.     Surgical History: Past Surgical History:  Procedure Laterality Date  . APPENDECTOMY    . BACK SURGERY      Medical History: Past Medical History:  Diagnosis Date  . Arthritis   . CHF (congestive heart failure) (HCC)   .  COPD (chronic obstructive pulmonary disease) (HCC)   . Depression   . GERD (gastroesophageal reflux disease)   . Stroke [facility](HCC)     Family History: Family History  Family history unknown: Yes    Social History   Socioeconomic History  . Marital status: Married    Spouse name: Not on file  . Number of children: Not on file  . Years of education: Not on file  . Highest education level: Not on file  Occupational History  . Not on file  Social Needs  . Financial resource strain: Not on file  . Food insecurity     Worry: Not on file    Inability: Not on file  . Transportation needs    Medical: Not on file    Non-medical: Not on file  Tobacco Use  . Smoking status: Current Some Day Smoker  . Smokeless tobacco: Never Used  Substance and Sexual Activity  . Alcohol use: No  . Drug use: Never  . Sexual activity: Not on file  Lifestyle  . Physical activity    Days per week: Not on file    Minutes per session: Not on file  . Stress: Not on file  Relationships  . Social Musicianconnections    Talks on phone: Not on file    Gets together: Not on file    Attends religious service: Not on file    Active member of club or organization: Not on file    Attends meetings of clubs or organizations: Not on file    Relationship status: Not on file  . Intimate partner violence    Fear of current or ex partner: Not on file    Emotionally abused: Not on file    Physically abused: Not on file    Forced sexual activity: Not on file  Other Topics Concern  . Not on file  Social History Narrative  . Not on file     Review of Systems  Constitutional: Negative for chills, fatigue and unexpected weight change.  HENT: Negative for congestion, rhinorrhea, sneezing and sore throat.   Eyes: Negative for photophobia, pain and redness.  Respiratory: Negative for cough, chest tightness and shortness of breath.   Cardiovascular: Negative for chest pain and palpitations.  Gastrointestinal: Negative for abdominal pain, constipation, diarrhea, nausea and vomiting.  Endocrine: Negative.   Genitourinary: Negative for dysuria and frequency.  Musculoskeletal: Negative for arthralgias, back pain, joint swelling and neck pain.  Skin: Negative for rash.  Allergic/Immunologic: Negative.   Neurological: Negative for tremors and numbness.  Hematological: Negative for adenopathy. Does not bruise/bleed easily.  Psychiatric/Behavioral: Negative for behavioral problems and sleep disturbance. The patient is not nervous/anxious.      Vital Signs: BP 116/60 (BP Location: Left Arm, Patient Position: Sitting, Cuff Size: Normal)   Pulse 93   Resp 16   Ht 5\' 3"  (1.6 m)   Wt 192 lb (87.1 kg)   SpO2 96%   BMI 34.01 kg/m    Physical Exam Vitals signs and nursing note reviewed.  Constitutional:      General: She is not in acute distress.    Appearance: She is well-developed. She is not diaphoretic.  HENT:     Head: Normocephalic and atraumatic.     Mouth/Throat:     Pharynx: No oropharyngeal exudate.  Eyes:     Pupils: Pupils are equal, round, and reactive to light.  Neck:     Musculoskeletal: Normal range of motion and neck supple.  Thyroid: No thyromegaly.     Vascular: No JVD.     Trachea: No tracheal deviation.  Cardiovascular:     Rate and Rhythm: Normal rate and regular rhythm.     Heart sounds: Normal heart sounds. No murmur. No friction rub. No gallop.   Pulmonary:     Effort: Pulmonary effort is normal. No respiratory distress.     Breath sounds: Normal breath sounds. No wheezing or rales.  Chest:     Chest wall: No tenderness.  Abdominal:     Palpations: Abdomen is soft.     Tenderness: There is no abdominal tenderness. There is no guarding.  Musculoskeletal: Normal range of motion.  Lymphadenopathy:     Cervical: No cervical adenopathy.  Skin:    General: Skin is warm and dry.  Neurological:     Mental Status: She is alert and oriented to person, place, and time.     Cranial Nerves: No cranial nerve deficit.  Psychiatric:        Behavior: Behavior normal.        Thought Content: Thought content normal.        Judgment: Judgment normal.    Assessment/Plan: 1. Chronic obstructive pulmonary disease, unspecified COPD type ([place]) Stable, continue current   2. Chronic midline low back pain with sciatica, sciatica laterality unspecified Pt is requesting narcotics, and we discussed that we could not prescribe them for her.  Will refer to pain clinic for alterative management.  -  Ambulatory referral to Pain Clinic  3. Primary osteoarthritis involving multiple joints Pt will continue to take otc meds and if she needs a RX strength medicine like celebrex, we will send it.   4. Gastroesophageal reflux disease without esophagitis Stabel, continue present management.   5. Depression, recurrent ([place]) Stable at this time.  Will continue to use celexa as directed.    General Counseling: xenia nile understanding of the findings of todays visit and agrees with plan of treatment. I have discussed any further diagnostic evaluation that may be needed or ordered today. We also reviewed her medications today. she has been encouraged to call the office with any questions or concerns that should arise related to todays visit.  Orders Placed This Encounter  Procedures  . Ambulatory referral to Pain Clinic    Meds ordered this encounter  Medications  . traMADol (ULTRAM) 50 MG tablet    Sig: Take 1 tablet (50 mg total) by mouth 2 (two) times daily for 5 days.    Dispense:  10 tablet    Refill:  0    Time spent: 45 Minutes   This patient was seen by Orson Gear AGNP-C in Collaboration with Dr Lavera Guise as a part of collaborative care agreement  Kendell Bane AGNP-C Internal Medicine

## 2019-06-04 ENCOUNTER — Other Ambulatory Visit: Payer: Self-pay

## 2019-06-04 MED ORDER — IBUPROFEN 800 MG PO TABS: 800.0000 mg | ORAL_TABLET | Freq: Three times a day (TID) | ORAL | 1 refills | Status: DC

## 2019-06-04 MED ORDER — ESOMEPRAZOLE MAGNESIUM 40 MG PO CPDR: 40.0000 mg | DELAYED_RELEASE_CAPSULE | Freq: Every day | ORAL | 3 refills | Status: DC

## 2019-06-05 ENCOUNTER — Other Ambulatory Visit: Payer: Self-pay | Admitting: Adult Health

## 2019-06-06 ENCOUNTER — Other Ambulatory Visit: Payer: Self-pay | Admitting: Adult Health

## 2019-06-06 LAB — CBC WITH DIFFERENTIAL/PLATELET
Basophils Absolute: 0.1 10*3/uL (ref 0.0–0.2)
Basos: 1 %
EOS (ABSOLUTE): 0.2 10*3/uL (ref 0.0–0.4)
Eos: 2 %
Hematocrit: 38.7 % (ref 34.0–46.6)
Hemoglobin: 12.7 g/dL (ref 11.1–15.9)
Immature Grans (Abs): 0 10*3/uL (ref 0.0–0.1)
Immature Granulocytes: 0 %
Lymphocytes Absolute: 2.8 10*3/uL (ref 0.7–3.1)
Lymphs: 28 %
MCH: 29.8 pg (ref 26.6–33.0)
MCHC: 32.8 g/dL (ref 31.5–35.7)
MCV: 91 fL (ref 79–97)
Monocytes Absolute: 0.7 10*3/uL (ref 0.1–0.9)
Monocytes: 7 %
Neutrophils Absolute: 6.2 10*3/uL (ref 1.4–7.0)
Neutrophils: 62 %
Platelets: 204 10*3/uL (ref 150–450)
RBC: 4.26 x10E6/uL (ref 3.77–5.28)
RDW: 12.7 % (ref 11.7–15.4)
WBC: 10 10*3/uL (ref 3.4–10.8)

## 2019-06-06 LAB — COMPREHENSIVE METABOLIC PANEL
ALT: 8 IU/L (ref 0–32)
AST: 13 IU/L (ref 0–40)
Albumin/Globulin Ratio: 1.9 (ref 1.2–2.2)
Albumin: 4.4 g/dL (ref 3.7–4.7)
Alkaline Phosphatase: 81 IU/L (ref 39–117)
BUN/Creatinine Ratio: 14 (ref 12–28)
BUN: 10 mg/dL (ref 8–27)
Bilirubin Total: 0.5 mg/dL (ref 0.0–1.2)
CO2: 26 mmol/L (ref 20–29)
Calcium: 9.5 mg/dL (ref 8.7–10.3)
Chloride: 98 mmol/L (ref 96–106)
Creatinine, Ser: 0.74 mg/dL (ref 0.57–1.00)
GFR calc Af Amer: 91 mL/min/{1.73_m2} (ref >59)
GFR calc non Af Amer: 79 mL/min/{1.73_m2} (ref >59)
Globulin, Total: 2.3 g/dL (ref 1.5–4.5)
Glucose: 99 mg/dL (ref 65–99)
Potassium: 4.4 mmol/L (ref 3.5–5.2)
Sodium: 139 mmol/L (ref 134–144)
Total Protein: 6.7 g/dL (ref 6.0–8.5)

## 2019-06-06 LAB — T4, FREE: Free T4: 1.04 ng/dL (ref 0.82–1.77)

## 2019-06-06 LAB — TSH: TSH: 3.07 u[IU]/mL (ref 0.450–4.500)

## 2019-06-06 LAB — LIPID PANEL WITH LDL/HDL RATIO
Cholesterol, Total: 230 mg/dL — ABNORMAL HIGH (ref 100–199)
HDL: 40 mg/dL (ref >39)
LDL Calculated: 150 mg/dL — ABNORMAL HIGH (ref 0–99)
LDl/HDL Ratio: 3.8 ratio — ABNORMAL HIGH (ref 0.0–3.2)
Triglycerides: 199 mg/dL — ABNORMAL HIGH (ref 0–149)
VLDL Cholesterol Cal: 40 mg/dL (ref 5–40)

## 2019-06-06 LAB — HGB A1C W/O EAG: Hgb A1c MFr Bld: 5.5 % (ref 4.8–5.6)

## 2019-06-09 ENCOUNTER — Telehealth: Payer: Self-pay

## 2019-06-09 ENCOUNTER — Other Ambulatory Visit: Payer: Self-pay | Admitting: Adult Health

## 2019-06-11 ENCOUNTER — Other Ambulatory Visit: Payer: Self-pay | Admitting: Adult Health

## 2019-06-11 MED ORDER — CELECOXIB 200 MG PO CAPS: 200.0000 mg | ORAL_CAPSULE | Freq: Two times a day (BID) | ORAL | 2 refills | Status: DC

## 2019-06-11 NOTE — Telephone Encounter (Signed)
Called patient and explained that ibuprofen was sent in on the 26 , pt states that ibuprofen has not worked for her arthritis , per [place] switch to celebrex.

## 2019-06-19 ENCOUNTER — Ambulatory Visit: Admitting: Adult Health

## 2019-07-01 ENCOUNTER — Other Ambulatory Visit: Payer: Self-pay

## 2019-07-01 ENCOUNTER — Telehealth: Payer: Self-pay

## 2019-07-01 ENCOUNTER — Ambulatory Visit (INDEPENDENT_AMBULATORY_CARE_PROVIDER_SITE_OTHER): Payer: Medicare Other | Admitting: Adult Health

## 2019-07-01 ENCOUNTER — Encounter: Payer: Self-pay | Admitting: Adult Health

## 2019-07-01 VITALS — BP 138/70 | HR 97 | Resp 16 | Ht 64.0 in | Wt 190.0 lb

## 2019-07-01 DIAGNOSIS — M150 Primary generalized (osteo)arthritis: Secondary | ICD-10-CM | POA: Diagnosis not present

## 2019-07-01 DIAGNOSIS — J449 Chronic obstructive pulmonary disease, unspecified: Secondary | ICD-10-CM

## 2019-07-01 DIAGNOSIS — F339 Major depressive disorder, recurrent, unspecified: Secondary | ICD-10-CM

## 2019-07-01 DIAGNOSIS — M159 Polyosteoarthritis, unspecified: Secondary | ICD-10-CM

## 2019-07-01 DIAGNOSIS — K219 Gastro-esophageal reflux disease without esophagitis: Secondary | ICD-10-CM

## 2019-07-01 MED ORDER — CELECOXIB 200 MG PO CAPS: 200.0000 mg | ORAL_CAPSULE | Freq: Two times a day (BID) | ORAL | 2 refills | Status: DC

## 2019-07-01 MED ORDER — ESOMEPRAZOLE MAGNESIUM 40 MG PO CPDR: 40.0000 mg | DELAYED_RELEASE_CAPSULE | Freq: Every day | ORAL | 2 refills | Status: DC

## 2019-07-01 MED ORDER — CITALOPRAM HYDROBROMIDE 40 MG PO TABS: 40.0000 mg | ORAL_TABLET | Freq: Every day | ORAL | 2 refills | Status: DC

## 2019-07-01 NOTE — Progress Notes (Signed)
[facility] [place], [place] [address]  Internal MEDICINE  Office Visit Note  Patient Name: Jennifer Hayes  [id number]  [id number]  Date of Service: 07/01/2019  Chief Complaint  Patient presents with  . Medical Management of Chronic Issues    4 week follow up , medications, still having alot of shoulder pain  . Depression    medication not working as well   . Gastroesophageal Reflux  . Anxiety    more anxety lately    HPI Pt is here for follow up.  She reports she has been having falls, and that her arthritis continues to cause her pain.  She does report that she has had some improvement with Celebrex.  Her GERD appears under control currently.  She doesn't believe that her depression and anxiety are well controlled at this time.  She is taking her medications and denies missing any does.  She feels like her decreasing mobility is causing much of her depression, and she is hopeful that a motorized wheelchair will be helpful with this.    Current Medication: Outpatient Encounter Medications as of 07/01/2019  Medication Sig  . celecoxib (CELEBREX) 200 MG capsule Take 1 capsule (200 mg total) by mouth 2 (two) times daily.  . Cholecalciferol (VITAMIN D3) 125 MCG (5000 UT) CAPS Take by mouth.  . citalopram (CELEXA) 40 MG tablet Take 1 tablet (40 mg total) by mouth daily.  . cyclobenzaprine (FLEXERIL) 10 MG tablet Take by mouth.  . esomeprazole (NEXIUM) 40 MG capsule Take 1 capsule (40 mg total) by mouth daily.  . fluticasone furoate-vilanterol (BREO ELLIPTA) 200-25 MCG/INH AEPB Inhale into the lungs.  . hydrOXYzine (VISTARIL) 25 MG capsule TK 1 C PO TID PRN FOR ITCHING  . Ipratropium-Albuterol (COMBIVENT) 20-100 MCG/ACT AERS respimat Inhale into the lungs.  Marland Kitchen ipratropium-albuterol (DUONEB) 0.5-2.5 (3) MG/3ML SOLN Take 3 mLs by nebulization 2 (two) times a day as needed.  Marland Kitchen levocetirizine (XYZAL) 5 MG tablet Take by mouth.  . Misc. Devices MISC  Portable oxygen  . nitroGLYCERIN (NITROSTAT) 0.4 MG SL tablet Place under the tongue.  . traZODone (DESYREL) 50 MG tablet Take by mouth.  . [DISCONTINUED] celecoxib (CELEBREX) 200 MG capsule Take 1 capsule (200 mg total) by mouth 2 (two) times daily.  . [DISCONTINUED] citalopram (CELEXA) 40 MG tablet Take by mouth.  . [DISCONTINUED] esomeprazole (NEXIUM) 40 MG capsule Take 1 capsule (40 mg total) by mouth daily.   No facility-administered encounter medications on file as of 07/01/2019.     Surgical History: Past Surgical History:  Procedure Laterality Date  . APPENDECTOMY    . BACK SURGERY      Medical History: Past Medical History:  Diagnosis Date  . Arthritis   . CHF (congestive heart failure) ([place])   . COPD (chronic obstructive pulmonary disease) ([place])   . Depression   . GERD (gastroesophageal reflux disease)   . Stroke [facility])     Family History: Family History  Family history unknown: Yes    Social History   Socioeconomic History  . Marital status: Married    Spouse name: Not on file  . Number of children: Not on file  . Years of education: Not on file  . Highest education level: Not on file  Occupational History  . Not on file  Social Needs  . Financial resource strain: Not on file  . Food insecurity    Worry: Not on file    Inability: Not on file  .  Transportation needs    Medical: Not on file    Non-medical: Not on file  Tobacco Use  . Smoking status: Current Some Day Smoker  . Smokeless tobacco: Never Used  Substance and Sexual Activity  . Alcohol use: No  . Drug use: Never  . Sexual activity: Not on file  Lifestyle  . Physical activity    Days per week: Not on file    Minutes per session: Not on file  . Stress: Not on file  Relationships  . Social Musician on phone: Not on file    Gets together: Not on file    Attends religious service: Not on file    Active member of club or organization: Not on file    Attends meetings of clubs  or organizations: Not on file    Relationship status: Not on file  . Intimate partner violence    Fear of current or ex partner: Not on file    Emotionally abused: Not on file    Physically abused: Not on file    Forced sexual activity: Not on file  Other Topics Concern  . Not on file  Social History Narrative  . Not on file      Review of Systems  Constitutional: Negative for chills, fatigue and unexpected weight change.  HENT: Negative for congestion, rhinorrhea, sneezing and sore throat.   Eyes: Negative for photophobia, pain and redness.  Respiratory: Negative for cough, chest tightness and shortness of breath.   Cardiovascular: Negative for chest pain and palpitations.  Gastrointestinal: Negative for abdominal pain, constipation, diarrhea, nausea and vomiting.  Endocrine: Negative.   Genitourinary: Negative for dysuria and frequency.  Musculoskeletal: Negative for arthralgias, back pain, joint swelling and neck pain.  Skin: Negative for rash.  Allergic/Immunologic: Negative.   Neurological: Negative for tremors and numbness.  Hematological: Negative for adenopathy. Does not bruise/bleed easily.  Psychiatric/Behavioral: Negative for behavioral problems and sleep disturbance. The patient is not nervous/anxious.     Vital Signs: BP 138/70   Pulse 97   Resp 16   Ht 5\' 4"  (1.626 m)   Wt 190 lb (86.2 kg)   SpO2 98%   BMI 32.61 kg/m    Physical Exam Vitals signs and nursing note reviewed.  Constitutional:      General: She is not in acute distress.    Appearance: She is well-developed. She is not diaphoretic.  HENT:     Head: Normocephalic and atraumatic.     Mouth/Throat:     Pharynx: No oropharyngeal exudate.  Eyes:     Pupils: Pupils are equal, round, and reactive to light.  Neck:     Musculoskeletal: Normal range of motion and neck supple.     Thyroid: No thyromegaly.     Vascular: No JVD.     Trachea: No tracheal deviation.  Cardiovascular:     Rate and  Rhythm: Normal rate and regular rhythm.     Heart sounds: Normal heart sounds. No murmur. No friction rub. No gallop.   Pulmonary:     Effort: Pulmonary effort is normal. No respiratory distress.     Breath sounds: Normal breath sounds. No wheezing or rales.  Chest:     Chest wall: No tenderness.  Abdominal:     Palpations: Abdomen is soft.     Tenderness: There is no abdominal tenderness. There is no guarding.  Musculoskeletal: Normal range of motion.  Lymphadenopathy:     Cervical: No cervical adenopathy.  Skin:  General: Skin is warm and dry.  Neurological:     Mental Status: She is alert and oriented to person, place, and time.     Cranial Nerves: No cranial nerve deficit.  Psychiatric:        Behavior: Behavior normal.        Thought Content: Thought content normal.        Judgment: Judgment normal.    Assessment/Plan: 1. Chronic obstructive pulmonary disease, unspecified COPD type (HCC) Stable, denies any issues.   2. Primary osteoarthritis involving multiple joints Continue celebrex, having good results.  Will continue to follow.  - celecoxib (CELEBREX) 200 MG capsule; Take 1 capsule (200 mg total) by mouth 2 (two) times daily.  Dispense: 60 capsule; Refill: 2  3. Depression, recurrent (HCC) Stable, continue present management.  - citalopram (CELEXA) 40 MG tablet; Take 1 tablet (40 mg total) by mouth daily.  Dispense: 90 tablet; Refill: 2  4. Gastroesophageal reflux disease without esophagitis Refilled nexium, continue present management.  - esomeprazole (NEXIUM) 40 MG capsule; Take 1 capsule (40 mg total) by mouth daily.  Dispense: 90 capsule; Refill: 2  General Counseling: Britton verbalizes understanding of the findings of todays visit and agrees with plan of treatment. I have discussed any further diagnostic evaluation that may be needed or ordered today. We also reviewed her medications today. she has been encouraged to call the office with any questions or  concerns that should arise related to todays visit.    No orders of the defined types were placed in this encounter.   Meds ordered this encounter  Medications  . celecoxib (CELEBREX) 200 MG capsule    Sig: Take 1 capsule (200 mg total) by mouth 2 (two) times daily.    Dispense:  60 capsule    Refill:  2    D/c ibuprofen 800mg   . esomeprazole (NEXIUM) 40 MG capsule    Sig: Take 1 capsule (40 mg total) by mouth daily.    Dispense:  90 capsule    Refill:  2  . citalopram (CELEXA) 40 MG tablet    Sig: Take 1 tablet (40 mg total) by mouth daily.    Dispense:  90 tablet    Refill:  2    Time spent: 20 Minutes   This patient was seen by Blima Ledger AGNP-C in Collaboration with Dr Lyndon Code as a part of collaborative care agreement     Johnna Acosta AGNP-C Internal medicine

## 2019-07-01 NOTE — Telephone Encounter (Signed)
Medication [facility] of [facility].

## 2019-07-01 NOTE — Telephone Encounter (Signed)
[place] [facility] canceled all meds

## 2019-07-01 NOTE — Telephone Encounter (Signed)
Pt was confused on which pharmacy she was using. Her correct pharmacy is [facility].

## 2019-07-02 ENCOUNTER — Encounter: Payer: Self-pay | Admitting: Adult Health

## 2019-07-02 ENCOUNTER — Telehealth: Payer: Self-pay

## 2019-07-02 NOTE — Telephone Encounter (Signed)
Power mobility orders faxed to [facility].

## 2019-07-12 ENCOUNTER — Other Ambulatory Visit: Payer: Self-pay | Admitting: Anesthesiology

## 2019-07-12 DIAGNOSIS — G8929 Other chronic pain: Secondary | ICD-10-CM

## 2019-07-12 DIAGNOSIS — M542 Cervicalgia: Secondary | ICD-10-CM

## 2019-07-12 DIAGNOSIS — M546 Pain in thoracic spine: Secondary | ICD-10-CM

## 2019-07-16 ENCOUNTER — Other Ambulatory Visit: Payer: Self-pay

## 2019-07-16 ENCOUNTER — Ambulatory Visit
Admission: RE | Admit: 2019-07-16 | Discharge: 2019-07-16 | Disposition: A | Discharge status: Home / Self Care | Payer: Medicare Other | Source: Ambulatory Visit | Attending: Anesthesiology | Admitting: Anesthesiology

## 2019-07-16 DIAGNOSIS — M542 Cervicalgia: Secondary | ICD-10-CM

## 2019-07-16 DIAGNOSIS — M546 Pain in thoracic spine: Secondary | ICD-10-CM | POA: Diagnosis present

## 2019-07-16 DIAGNOSIS — G8929 Other chronic pain: Secondary | ICD-10-CM

## 2019-07-16 DIAGNOSIS — M545 Low back pain: Secondary | ICD-10-CM | POA: Insufficient documentation

## 2019-08-12 ENCOUNTER — Other Ambulatory Visit: Payer: Self-pay | Admitting: Adult Health

## 2019-08-12 MED ORDER — IBUPROFEN 800 MG PO TABS: 800.0000 mg | ORAL_TABLET | Freq: Three times a day (TID) | ORAL | 2 refills | Status: DC

## 2019-08-19 ENCOUNTER — Other Ambulatory Visit: Payer: Self-pay

## 2019-08-19 ENCOUNTER — Encounter: Payer: Self-pay | Admitting: Adult Health

## 2019-08-19 ENCOUNTER — Ambulatory Visit (INDEPENDENT_AMBULATORY_CARE_PROVIDER_SITE_OTHER): Payer: Medicare Other | Admitting: Adult Health

## 2019-08-19 VITALS — BP 141/71 | HR 71 | Temp 97.0°F | Resp 16 | Ht 63.0 in | Wt 188.0 lb

## 2019-08-19 DIAGNOSIS — M159 Polyosteoarthritis, unspecified: Secondary | ICD-10-CM

## 2019-08-19 DIAGNOSIS — G4709 Other insomnia: Secondary | ICD-10-CM | POA: Diagnosis not present

## 2019-08-19 DIAGNOSIS — M8949 Other hypertrophic osteoarthropathy, multiple sites: Secondary | ICD-10-CM | POA: Diagnosis not present

## 2019-08-19 DIAGNOSIS — K219 Gastro-esophageal reflux disease without esophagitis: Secondary | ICD-10-CM

## 2019-08-19 DIAGNOSIS — L02422 Furuncle of left axilla: Secondary | ICD-10-CM | POA: Diagnosis not present

## 2019-08-19 DIAGNOSIS — Z741 Need for assistance with personal care: Secondary | ICD-10-CM

## 2019-08-19 MED ORDER — DOXYCYCLINE HYCLATE 100 MG PO TABS: 100.0000 mg | ORAL_TABLET | Freq: Two times a day (BID) | ORAL | 0 refills | Status: DC

## 2019-08-19 NOTE — Progress Notes (Signed)
[facility] [place], [place] [address]  Internal MEDICINE  Office Visit Note  Patient Name: Jennifer Hayes  [id number]  [id number]  Date of Service: 08/19/2019  Chief Complaint  Patient presents with  . Cyst    left under arm     HPI Pt is here for a sick visit. Pt reports yesterday she noticed painful boil-like area in her left axilla. She reports a history of a boil many years ago, with no reoccurance since.  She denies any fever, or other symptoms. She reports the pain is getting worse.  She has not attempted to treat it with any OTC medications or therapy at this time.      Current Medication:  Outpatient Encounter Medications as of 08/19/2019  Medication Sig  . Cholecalciferol (VITAMIN D3) 125 MCG (5000 UT) CAPS Take by mouth.  . citalopram (CELEXA) 40 MG tablet Take 1 tablet (40 mg total) by mouth daily.  . cyclobenzaprine (FLEXERIL) 10 MG tablet Take by mouth.  . esomeprazole (NEXIUM) 40 MG capsule Take 1 capsule (40 mg total) by mouth daily.  . fluticasone furoate-vilanterol (BREO ELLIPTA) 200-25 MCG/INH AEPB Inhale into the lungs.  . hydrOXYzine (VISTARIL) 25 MG capsule TK 1 C PO TID PRN FOR ITCHING  . ibuprofen (ADVIL) 800 MG tablet Take 1 tablet (800 mg total) by mouth 3 (three) times daily.  . Ipratropium-Albuterol (COMBIVENT) 20-100 MCG/ACT AERS respimat Inhale into the lungs.  Marland Kitchen ipratropium-albuterol (DUONEB) 0.5-2.5 (3) MG/3ML SOLN Take 3 mLs by nebulization 2 (two) times a day as needed.  Marland Kitchen levocetirizine (XYZAL) 5 MG tablet Take by mouth.  . Misc. Devices MISC Portable oxygen  . nitroGLYCERIN (NITROSTAT) 0.4 MG SL tablet Place under the tongue.  . traZODone (DESYREL) 50 MG tablet Take by mouth.  . celecoxib (CELEBREX) 200 MG capsule Take 1 capsule (200 mg total) by mouth 2 (two) times daily. (Patient not taking: Reported on 08/12/2019)  . doxycycline (VIBRA-TABS) 100 MG tablet Take 1 tablet (100 mg total) by mouth 2 (two)  times daily.   No facility-administered encounter medications on file as of 08/19/2019.       Medical History: Past Medical History:  Diagnosis Date  . Arthritis   . CHF (congestive heart failure) ([place])   . COPD (chronic obstructive pulmonary disease) ([place])   . Depression   . GERD (gastroesophageal reflux disease)   . Stroke ([place])      Vital Signs: BP (!) 141/71   Pulse 71   Temp (!) 97 F (36.1 C)   Resp 16   Ht 5\' 3"  (1.6 m)   Wt 188 lb (85.3 kg)   SpO2 95%   BMI 33.30 kg/m    Review of Systems  Constitutional: Negative for chills, fatigue and unexpected weight change.  HENT: Negative for congestion, rhinorrhea, sneezing and sore throat.   Eyes: Negative for photophobia, pain and redness.  Respiratory: Negative for cough, chest tightness and shortness of breath.   Cardiovascular: Negative for chest pain and palpitations.  Gastrointestinal: Negative for abdominal pain, constipation, diarrhea, nausea and vomiting.  Endocrine: Negative.   Genitourinary: Negative for dysuria and frequency.  Musculoskeletal: Negative for arthralgias, back pain, joint swelling and neck pain.  Skin: Negative for rash.       Left axilla boils  Allergic/Immunologic: Negative.   Neurological: Negative for tremors and numbness.  Hematological: Negative for adenopathy. Does not bruise/bleed easily.  Psychiatric/Behavioral: Negative for behavioral problems and sleep disturbance. The patient is not nervous/anxious.  Physical Exam Vitals signs and nursing note reviewed.  Constitutional:      General: She is not in acute distress.    Appearance: She is well-developed. She is not diaphoretic.  HENT:     Head: Normocephalic and atraumatic.     Mouth/Throat:     Pharynx: No oropharyngeal exudate.  Eyes:     Pupils: Pupils are equal, round, and reactive to light.  Neck:     Musculoskeletal: Normal range of motion and neck supple.     Thyroid: No thyromegaly.     Vascular: No JVD.      Trachea: No tracheal deviation.  Cardiovascular:     Rate and Rhythm: Normal rate and regular rhythm.     Heart sounds: Normal heart sounds. No murmur. No friction rub. No gallop.   Pulmonary:     Effort: Pulmonary effort is normal. No respiratory distress.     Breath sounds: Normal breath sounds. No wheezing or rales.  Chest:     Chest wall: No tenderness.  Abdominal:     Palpations: Abdomen is soft.     Tenderness: There is no abdominal tenderness. There is no guarding.  Musculoskeletal: Normal range of motion.  Lymphadenopathy:     Cervical: No cervical adenopathy.  Skin:    General: Skin is warm and dry.  Neurological:     Mental Status: She is alert and oriented to person, place, and time.     Cranial Nerves: No cranial nerve deficit.  Psychiatric:        Behavior: Behavior normal.        Thought Content: Thought content normal.        Judgment: Judgment normal.     Assessment/Plan: 1. Furuncle of left axilla Advised patient to take entire course of antibiotics as prescribed with food. Pt should return to clinic in 7-10 days if symptoms fail to improve or new symptoms develop. Also, use warm compress, 3-4 times daily.   - doxycycline (VIBRA-TABS) 100 MG tablet; Take 1 tablet (100 mg total) by mouth 2 (two) times daily.  Dispense: 20 tablet; Refill: 0  2. Other insomnia Encouraged patient to use her vistaril at night, to help her with sleep.   3. Primary osteoarthritis involving multiple joints Pt feels like her shoulder is worse, and having trouble moving it.  She is have trouble with completing her ADL's. And a home health referral is placed for evaluation and treatment.   4. Gastroesophageal reflux disease without esophagitis Stable, continue present management.   General Counseling: ayme short understanding of the findings of todays visit and agrees with plan of treatment. I have discussed any further diagnostic evaluation that may be needed or ordered today.  We also reviewed her medications today. she has been encouraged to call the office with any questions or concerns that should arise related to todays visit.   Orders Placed This Encounter  Procedures  . Ambulatory referral to Home Health    Meds ordered this encounter  Medications  . doxycycline (VIBRA-TABS) 100 MG tablet    Sig: Take 1 tablet (100 mg total) by mouth 2 (two) times daily.    Dispense:  20 tablet    Refill:  0    Time spent: 25 Minutes  This patient was seen by Blima Ledger AGNP-C in Collaboration with Dr Lyndon Code as a part of collaborative care agreement.  Johnna Acosta AGNP-C Internal Medicine

## 2019-09-03 ENCOUNTER — Ambulatory Visit: Admitting: Adult Health

## 2019-09-15 ENCOUNTER — Telehealth: Payer: Self-pay

## 2019-09-15 NOTE — Telephone Encounter (Signed)
Confirmed patient appt for 09/17/19. Jennifer Hayes

## 2019-09-16 ENCOUNTER — Telehealth: Payer: Self-pay

## 2019-09-16 NOTE — Telephone Encounter (Signed)
Rescheduled appointment on 09/17/2019 to 10/14/2018. klh

## 2019-09-17 ENCOUNTER — Ambulatory Visit: Admitting: Adult Health

## 2019-10-13 ENCOUNTER — Telehealth: Payer: Self-pay

## 2019-10-13 NOTE — Telephone Encounter (Signed)
Confirmed appointment with patient. klh °

## 2019-10-15 ENCOUNTER — Ambulatory Visit (INDEPENDENT_AMBULATORY_CARE_PROVIDER_SITE_OTHER): Payer: Medicare Other | Admitting: Adult Health

## 2019-10-15 ENCOUNTER — Other Ambulatory Visit: Payer: Self-pay

## 2019-10-15 ENCOUNTER — Encounter (INDEPENDENT_AMBULATORY_CARE_PROVIDER_SITE_OTHER): Payer: Self-pay

## 2019-10-15 ENCOUNTER — Encounter: Payer: Self-pay | Admitting: Adult Health

## 2019-10-15 VITALS — BP 144/69 | HR 80 | Resp 16 | Ht 63.0 in | Wt 189.8 lb

## 2019-10-15 DIAGNOSIS — L02422 Furuncle of left axilla: Secondary | ICD-10-CM | POA: Diagnosis not present

## 2019-10-15 DIAGNOSIS — J449 Chronic obstructive pulmonary disease, unspecified: Secondary | ICD-10-CM

## 2019-10-15 DIAGNOSIS — K219 Gastro-esophageal reflux disease without esophagitis: Secondary | ICD-10-CM | POA: Diagnosis not present

## 2019-10-15 DIAGNOSIS — M159 Polyosteoarthritis, unspecified: Secondary | ICD-10-CM

## 2019-10-15 DIAGNOSIS — G2581 Restless legs syndrome: Secondary | ICD-10-CM

## 2019-10-15 DIAGNOSIS — M8949 Other hypertrophic osteoarthropathy, multiple sites: Secondary | ICD-10-CM

## 2019-10-15 MED ORDER — CYCLOBENZAPRINE HCL 10 MG PO TABS: 10.0000 mg | ORAL_TABLET | Freq: Every day | ORAL | 1 refills | Status: DC

## 2019-10-15 NOTE — Progress Notes (Signed)
[facility] [address] [place], [place] [address]  Internal MEDICINE  Office Visit Note  Patient Name: Jennifer Hayes  [id number]  [id number]  Date of Service: 10/19/2019  Chief Complaint  Patient presents with  . Follow-up    furnucle of left axilla  . Medication Management    would like muscle relaxers feels like legs are restless, only happens sometimes at night     HPI  Pt is here for follow up on axilla furuncle.  She reports completing the antibiotics with no difficulty.  She reports the furuncle is completly gone.  She does report her restless leg is bother her intermittently and she would like a refill on her flexeril at this time.  She denies any leg pain or cramping during the day.    Current Medication: Outpatient Encounter Medications as of 10/15/2019  Medication Sig  . Cholecalciferol (VITAMIN D3) 125 MCG (5000 UT) CAPS Take by mouth.  . citalopram (CELEXA) 40 MG tablet Take 1 tablet (40 mg total) by mouth daily.  . cyclobenzaprine (FLEXERIL) 10 MG tablet Take 1 tablet (10 mg total) by mouth at bedtime.  Marland Kitchen doxycycline (VIBRA-TABS) 100 MG tablet Take 1 tablet (100 mg total) by mouth 2 (two) times daily.  Marland Kitchen esomeprazole (NEXIUM) 40 MG capsule Take 1 capsule (40 mg total) by mouth daily.  . fluticasone furoate-vilanterol (BREO ELLIPTA) 200-25 MCG/INH AEPB Inhale into the lungs.  . hydrOXYzine (VISTARIL) 25 MG capsule TK 1 C PO TID PRN FOR ITCHING  . ibuprofen (ADVIL) 800 MG tablet Take 1 tablet (800 mg total) by mouth 3 (three) times daily.  . Ipratropium-Albuterol (COMBIVENT) 20-100 MCG/ACT AERS respimat Inhale into the lungs.  Marland Kitchen ipratropium-albuterol (DUONEB) 0.5-2.5 (3) MG/3ML SOLN Take 3 mLs by nebulization 2 (two) times a day as needed.  Marland Kitchen levocetirizine (XYZAL) 5 MG tablet Take by mouth.  . Misc. Devices MISC Portable oxygen  . nitroGLYCERIN (NITROSTAT) 0.4 MG SL tablet Place under the tongue.  . [DISCONTINUED] cyclobenzaprine (FLEXERIL) 10  MG tablet Take by mouth.  . [DISCONTINUED] traZODone (DESYREL) 50 MG tablet Take by mouth.  . [DISCONTINUED] celecoxib (CELEBREX) 200 MG capsule Take 1 capsule (200 mg total) by mouth 2 (two) times daily. (Patient not taking: Reported on 10/15/2019)   No facility-administered encounter medications on file as of 10/15/2019.    Surgical History: Past Surgical History:  Procedure Laterality Date  . APPENDECTOMY    . BACK SURGERY      Medical History: Past Medical History:  Diagnosis Date  . Arthritis   . CHF (congestive heart failure) (HCC)   . COPD (chronic obstructive pulmonary disease) (HCC)   . Depression   . GERD (gastroesophageal reflux disease)   . Stroke [facility])     Family History: Family History  Family history unknown: Yes    Social History   Socioeconomic History  . Marital status: Married    Spouse name: Not on file  . Number of children: Not on file  . Years of education: Not on file  . Highest education level: Not on file  Occupational History  . Not on file  Tobacco Use  . Smoking status: Current Some Day Smoker  . Smokeless tobacco: Never Used  Substance and Sexual Activity  . Alcohol use: No  . Drug use: Never  . Sexual activity: Not on file  Other Topics Concern  . Not on file  Social History Narrative  . Not on file   Social Determinants of Health  Financial Resource Strain:   . Difficulty of Paying Living Expenses: Not on file  Food Insecurity:   . Worried About Programme researcher, broadcasting/film/video in the Last Year: Not on file  . Ran Out of Food in the Last Year: Not on file  Transportation Needs:   . Lack of Transportation (Medical): Not on file  . Lack of Transportation (Non-Medical): Not on file  Physical Activity:   . Days of Exercise per Week: Not on file  . Minutes of Exercise per Session: Not on file  Stress:   . Feeling of Stress : Not on file  Social Connections:   . Frequency of Communication with Friends and Family: Not on file  . Frequency  of Social Gatherings with Friends and Family: Not on file  . Attends Religious Services: Not on file  . Active Member of Clubs or Organizations: Not on file  . Attends Banker Meetings: Not on file  . Marital Status: Not on file  Intimate Partner Violence:   . Fear of Current or Ex-Partner: Not on file  . Emotionally Abused: Not on file  . Physically Abused: Not on file  . Sexually Abused: Not on file      Review of Systems  Constitutional: Negative for chills, fatigue and unexpected weight change.  HENT: Negative for congestion, rhinorrhea, sneezing and sore throat.   Eyes: Negative for photophobia, pain and redness.  Respiratory: Negative for cough, chest tightness and shortness of breath.   Cardiovascular: Negative for chest pain and palpitations.  Gastrointestinal: Negative for abdominal pain, constipation, diarrhea, nausea and vomiting.  Endocrine: Negative.   Genitourinary: Negative for dysuria and frequency.  Musculoskeletal: Negative for arthralgias, back pain, joint swelling and neck pain.  Skin: Negative for rash.  Allergic/Immunologic: Negative.   Neurological: Negative for tremors and numbness.  Hematological: Negative for adenopathy. Does not bruise/bleed easily.  Psychiatric/Behavioral: Negative for behavioral problems and sleep disturbance. The patient is not nervous/anxious.     Vital Signs: BP (!) 144/69   Pulse 80   Resp 16   Ht 5\' 3"  (1.6 m)   Wt 189 lb 12.8 oz (86.1 kg)   SpO2 95%   BMI 33.62 kg/m    Physical Exam Vitals and nursing note reviewed.  Constitutional:      General: She is not in acute distress.    Appearance: She is well-developed. She is not diaphoretic.  HENT:     Head: Normocephalic and atraumatic.     Mouth/Throat:     Pharynx: No oropharyngeal exudate.  Eyes:     Pupils: Pupils are equal, round, and reactive to light.  Neck:     Thyroid: No thyromegaly.     Vascular: No JVD.     Trachea: No tracheal deviation.   Cardiovascular:     Rate and Rhythm: Normal rate and regular rhythm.     Heart sounds: Normal heart sounds. No murmur. No friction rub. No gallop.   Pulmonary:     Effort: Pulmonary effort is normal. No respiratory distress.     Breath sounds: Normal breath sounds. No wheezing or rales.  Chest:     Chest wall: No tenderness.  Abdominal:     Palpations: Abdomen is soft.     Tenderness: There is no abdominal tenderness. There is no guarding.  Musculoskeletal:        General: Normal range of motion.     Cervical back: Normal range of motion and neck supple.  Lymphadenopathy:  Cervical: No cervical adenopathy.  Skin:    General: Skin is warm and dry.  Neurological:     Mental Status: She is alert and oriented to person, place, and time.     Cranial Nerves: No cranial nerve deficit.  Psychiatric:        Behavior: Behavior normal.        Thought Content: Thought content normal.        Judgment: Judgment normal.     Assessment/Plan: 1. Furuncle of left axilla Resolved.  No further intervention required.   2. Restless leg Use flexeril as directed.  - cyclobenzaprine (FLEXERIL) 10 MG tablet; Take 1 tablet (10 mg total) by mouth at bedtime.  Dispense: 30 tablet; Refill: 1  3. Gastroesophageal reflux disease without esophagitis Stable, continue present management.   4. Chronic obstructive pulmonary disease, unspecified COPD type ([place]) Controlled, continue present management.   5. Primary osteoarthritis involving multiple joints Pt feels like her shoulder is worse, and having trouble moving it.  She is have trouble with completing her ADL's. And a home health referral is placed for evaluation and treatment.  - Ambulatory referral to [place]: jenicka coxe understanding of the findings of todays visit and agrees with plan of treatment. I have discussed any further diagnostic evaluation that may be needed or ordered today. We also reviewed her  medications today. she has been encouraged to call the office with any questions or concerns that should arise related to todays visit.    Orders Placed This Encounter  Procedures  . Ambulatory referral to [place] ordered this encounter  Medications  . cyclobenzaprine (FLEXERIL) 10 MG tablet    Sig: Take 1 tablet (10 mg total) by mouth at bedtime.    Dispense:  30 tablet    Refill:  1    Time spent: 25 Minutes   This patient was seen by Orson Gear AGNP-C in Collaboration with Dr Lavera Guise as a part of collaborative care agreement     Kendell Bane AGNP-C Internal medicine

## 2019-10-20 ENCOUNTER — Telehealth: Payer: Self-pay

## 2019-10-20 NOTE — Telephone Encounter (Signed)
Gave verbal order to dotty from [phone] for Physical therapy,occupational therapy and home health aide

## 2019-11-10 ENCOUNTER — Other Ambulatory Visit: Payer: Self-pay

## 2019-11-10 ENCOUNTER — Telehealth: Payer: Self-pay

## 2019-11-10 MED ORDER — SULFAMETHOXAZOLE-TRIMETHOPRIM 800-160 MG PO TABS: 1.0000 | ORAL_TABLET | Freq: Two times a day (BID) | ORAL | 0 refills | Status: DC

## 2019-11-10 NOTE — Telephone Encounter (Signed)
Pt advised we send antibiotic for cyst under arm if not feeling better need to been seen

## 2019-11-10 NOTE — Telephone Encounter (Signed)
As per adam send bactrim bid for 10 days I am just waiting for pt which phar she like [place] to send pres

## 2019-11-10 NOTE — Telephone Encounter (Signed)
lmom to call us back regarding which phar we can send antibiotic

## 2019-11-12 ENCOUNTER — Other Ambulatory Visit: Payer: Self-pay

## 2019-11-12 MED ORDER — IPRATROPIUM-ALBUTEROL 0.5-2.5 (3) MG/3ML IN SOLN: RESPIRATORY_TRACT | 3 refills | Status: DC

## 2019-11-20 ENCOUNTER — Ambulatory Visit: Admitting: Adult Health

## 2019-12-14 ENCOUNTER — Other Ambulatory Visit: Payer: Self-pay

## 2019-12-14 ENCOUNTER — Emergency Department: Payer: Medicare Other

## 2019-12-14 ENCOUNTER — Emergency Department
Admission: EM | Admit: 2019-12-14 | Discharge: 2019-12-14 | Disposition: A | Discharge status: Home / Self Care | Payer: Medicare Other | Attending: Emergency Medicine | Admitting: Emergency Medicine

## 2019-12-14 ENCOUNTER — Encounter: Payer: Self-pay | Admitting: *Deleted

## 2019-12-14 DIAGNOSIS — I110 Hypertensive heart disease with heart failure: Secondary | ICD-10-CM | POA: Insufficient documentation

## 2019-12-14 DIAGNOSIS — R0602 Shortness of breath: Secondary | ICD-10-CM | POA: Diagnosis present

## 2019-12-14 DIAGNOSIS — Z79899 Other long term (current) drug therapy: Secondary | ICD-10-CM | POA: Diagnosis not present

## 2019-12-14 DIAGNOSIS — J441 Chronic obstructive pulmonary disease with (acute) exacerbation: Secondary | ICD-10-CM

## 2019-12-14 DIAGNOSIS — I509 Heart failure, unspecified: Secondary | ICD-10-CM | POA: Diagnosis not present

## 2019-12-14 LAB — BASIC METABOLIC PANEL
Anion gap: 7 (ref 5–15)
BUN: 20 mg/dL (ref 8–23)
CO2: 27 mmol/L (ref 22–32)
Calcium: 8.8 mg/dL — ABNORMAL LOW (ref 8.9–10.3)
Chloride: 104 mmol/L (ref 98–111)
Creatinine, Ser: 0.54 mg/dL (ref 0.44–1.00)
GFR calc Af Amer: >60 mL/min (ref >60)
GFR calc non Af Amer: >60 mL/min (ref >60)
Glucose, Bld: 108 mg/dL — ABNORMAL HIGH (ref 70–99)
Potassium: 4.2 mmol/L (ref 3.5–5.1)
Sodium: 138 mmol/L (ref 135–145)

## 2019-12-14 LAB — CBC
HCT: 38.3 % (ref 36.0–46.0)
Hemoglobin: 12.8 g/dL (ref 12.0–15.0)
MCH: 30.3 pg (ref 26.0–34.0)
MCHC: 33.4 g/dL (ref 30.0–36.0)
MCV: 90.5 fL (ref 80.0–100.0)
Platelets: 193 10*3/uL (ref 150–400)
RBC: 4.23 MIL/uL (ref 3.87–5.11)
RDW: 12.2 % (ref 11.5–15.5)
WBC: 8.2 10*3/uL (ref 4.0–10.5)
nRBC: 0 % (ref 0.0–0.2)

## 2019-12-14 LAB — TROPONIN I (HIGH SENSITIVITY)
Troponin I (High Sensitivity): <2 ng/L (ref <18)
Troponin I (High Sensitivity): <2 ng/L (ref <18)

## 2019-12-14 LAB — BRAIN NATRIURETIC PEPTIDE: B Natriuretic Peptide: 126 pg/mL — ABNORMAL HIGH (ref 0.0–100.0)

## 2019-12-14 MED ORDER — METHYLPREDNISOLONE SODIUM SUCC 125 MG IJ SOLR
125.0000 mg | Freq: Once | INTRAMUSCULAR | Status: AC
  Administered 2019-12-14: 17:00:00 125 mg via INTRAVENOUS
  Filled 2019-12-14: qty 2

## 2019-12-14 MED ORDER — PREDNISONE 10 MG (21) PO TBPK: ORAL_TABLET | ORAL | 0 refills | Status: DC

## 2019-12-14 MED ORDER — IPRATROPIUM-ALBUTEROL 0.5-2.5 (3) MG/3ML IN SOLN
3.0000 mL | Freq: Once | RESPIRATORY_TRACT | Status: AC
  Administered 2019-12-14: 3 mL via RESPIRATORY_TRACT

## 2019-12-14 MED ORDER — IPRATROPIUM-ALBUTEROL 0.5-2.5 (3) MG/3ML IN SOLN
3.0000 mL | Freq: Once | RESPIRATORY_TRACT | Status: AC
  Administered 2019-12-14: 3 mL via RESPIRATORY_TRACT
  Filled 2019-12-14: qty 9

## 2019-12-14 MED ORDER — AZITHROMYCIN 250 MG PO TABS: ORAL_TABLET | ORAL | 0 refills | Status: AC

## 2019-12-14 NOTE — ED Notes (Signed)
Patient transported to X-Ray 

## 2019-12-14 NOTE — ED Provider Notes (Signed)
[facility] Emergency Department Provider Note   ____________________________________________   I have reviewed the triage vital signs and the nursing notes.   HISTORY  Chief Complaint Shortness of Breath   History limited by: Not Limited   HPI Jennifer Hayes is a 79 y.o. female who presents to the emergency department today because of concern for shortness of breath. She states that she started noticing some shortness of breath yesterday but it got worse overnight. She does have oxygen at home which she uses as needed so did put it on overnight. The patient tried using a nebulizer treatment yesterday without great relief. She denies any significant chest pain with the shortness of breath. Has had a cough. The patient denies any fevers or leg swelling. States that the symptoms remind her of when she has had COPD flare ups in the past.    Records reviewed. Per medical record review patient has a history of COPD, CHF.   Past Medical History:  Diagnosis Date  . Arthritis   . CHF (congestive heart failure) ([place])   . COPD (chronic obstructive pulmonary disease) ([place])   . Depression   . GERD (gastroesophageal reflux disease)   . Stroke [facility])     Patient Active Problem List   Diagnosis Date Noted  . MDD (major depressive disorder) 06/02/2019  . Cigarette nicotine dependence without complication 08/65/7846  . Major depressive disorder in full remission ([place]) 04/07/2019  . HTN (hypertension) 12/26/2018  . Chronic bilateral low back pain 05/09/2018  . 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/31/2017  . Other hyperlipidemia 10/17/2017  . Chronic pain syndrome 09/03/2017  . 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  . Lumbar post-laminectomy syndrome 09/03/2017  . Chronic pruritus 01/04/2017  . Vitamin B12 deficiency 01/01/2017  . Panlobular emphysema ([place]) 11/23/2016  . GERD (gastroesophageal reflux  disease) 11/23/2016  . History of TIA (transient ischemic attack) 11/23/2016  . Insomnia 11/23/2016  . Obesity, Class I, BMI 30-34.9 11/23/2016  . Rotator cuff arthropathy, right 02/04/2016  . Bilateral patellofemoral syndrome 09/10/2015  . Primary osteoarthritis of both knees 09/10/2015  . DDD (degenerative disc disease), cervical 11/20/2014    Past Surgical History:  Procedure Laterality Date  . APPENDECTOMY    . BACK SURGERY      Prior to Admission medications   Medication Sig Start Date End Date Taking? Authorizing Provider  Cholecalciferol (VITAMIN D3) 125 MCG (5000 UT) CAPS Take by mouth.    [provider]  citalopram (CELEXA) 40 MG tablet Take 1 tablet (40 mg total) by mouth daily. 07/01/19 06/30/20  Kendell Bane, NP  cyclobenzaprine (FLEXERIL) 10 MG tablet Take 1 tablet (10 mg total) by mouth at bedtime. 10/15/19   Kendell Bane, NP  esomeprazole (NEXIUM) 40 MG capsule Take 1 capsule (40 mg total) by mouth daily. 07/01/19   Kendell Bane, NP  fluticasone furoate-vilanterol (BREO ELLIPTA) 200-25 MCG/INH AEPB Inhale into the lungs. 04/07/19   [provider]  hydrOXYzine (VISTARIL) 25 MG capsule TK 1 C PO TID PRN FOR ITCHING 10/14/16   [provider]  ibuprofen (ADVIL) 800 MG tablet Take 1 tablet (800 mg total) by mouth 3 (three) times daily. 08/12/19   Kendell Bane, NP  Ipratropium-Albuterol (COMBIVENT) 20-100 MCG/ACT AERS respimat Inhale into the lungs. 06/07/18   [provider]  ipratropium-albuterol (DUONEB) 0.5-2.5 (3) MG/3ML SOLN Take 3 mLs by nebulization 2 (two) times a day as needed. 11/12/19   [place],  [place]e [place], NP  levocetirizine (XYZAL) 5 MG tablet Take by mouth. 01/14/19   [provider]  Misc. Devices MISC Portable oxygen 04/28/19   [provider]  nitroGLYCERIN (NITROSTAT) 0.4 MG SL tablet Place under the tongue. 03/21/17   [provider]  sulfamethoxazole-trimethoprim (BACTRIM DS) 800-160 MG tablet  Take 1 tablet by mouth 2 (two) times daily. 11/10/19   Johnna Acosta, NP    Allergies Bupropion and Penicillins  Family History  Family history unknown: Yes    Social History Social History   Tobacco Use  . Smoking status: Former Games developer  . Smokeless tobacco: Never Used  . Tobacco comment: quit two weeks ago  Substance Use Topics  . Alcohol use: Yes    Comment: occasionally  . Drug use: Never    Review of Systems Constitutional: No fever/chills Eyes: No visual changes. ENT: No sore throat. Cardiovascular: Denies chest pain. Respiratory: Positive for shortness of breath. Gastrointestinal: No abdominal pain.  No nausea, no vomiting.  No diarrhea.   Genitourinary: Negative for dysuria. Musculoskeletal: Negative for back pain. Skin: Negative for rash. Neurological: Negative for headaches, focal weakness or numbness.  ____________________________________________   PHYSICAL EXAM:  VITAL SIGNS: ED Triage Vitals  Enc Vitals Group     BP 12/14/19 1322 (!) 143/77     Pulse Rate 12/14/19 1322 80     Resp 12/14/19 1322 20     Temp 12/14/19 1322 98 F (36.7 C)     Temp Source 12/14/19 1322 Oral     SpO2 12/14/19 1322 (!) 83 %     Weight 12/14/19 1330 185 lb (83.9 kg)     Height 12/14/19 1330 5\' 2"  (1.575 m)     Head Circumference --      Peak Flow --      Pain Score 12/14/19 1330 4   Constitutional: Alert and oriented.  Eyes: Conjunctivae are normal.  ENT      Head: Normocephalic and atraumatic.      Nose: No congestion/rhinnorhea.      Mouth/Throat: Mucous membranes are moist.      Neck: No stridor. Hematological/Lymphatic/Immunilogical: No cervical lymphadenopathy. Cardiovascular: Normal rate, regular rhythm.  No murmurs, rubs, or gallops.  Respiratory: Slightly increased respiratory effort. Diffuse expiratory wheezing.  Gastrointestinal: Soft and non tender. No rebound. No guarding.  Genitourinary: Deferred Musculoskeletal: Normal range of motion in all  extremities. No lower extremity edema. Neurologic:  Normal speech and language. No gross focal neurologic deficits are appreciated.  Skin:  Skin is warm, dry and intact. No rash noted. Psychiatric: Mood and affect are normal. Speech and behavior are normal. Patient exhibits appropriate insight and judgment.  ____________________________________________    LABS (pertinent positives/negatives)  Trop hs <2 CBC wbc 8.2, hgb 12.8, plt 193 BNP 126.0 BMP wnl except glu 108, ca 8.8  ____________________________________________   EKG  I, 02/13/20, attending physician, personally viewed and interpreted this EKG  EKG Time: 1404 Rate: 78 Rhythm: normal sinus rhythm Axis: normal Intervals: qtc 444 QRS: narrow ST changes: no st elevation Impression: abnormal ekg ____________________________________________    RADIOLOGY  CXR Changes consistent with bronchitis/asthma. No focal pneumonia  ____________________________________________   PROCEDURES  Procedures  ____________________________________________   INITIAL IMPRESSION / ASSESSMENT AND PLAN / ED COURSE  Pertinent labs & imaging results that were available during my care of the patient were reviewed by me and considered in my medical decision making (see chart for details).   Patient presented to the emergency department today  because of concern for shortness of breath. Has a history of COPD. CXR without pneumonia or PTX. No concerning blood work abnormalities. Patient was given steroids and neb treatments here. Stated she felt much better after. Did get up and stated she was abel to get to the bathroom easily. Will plan on discharging patient with prescription for steroids and antibiotics.   ____________________________________________   FINAL CLINICAL IMPRESSION(S) / ED DIAGNOSES  Final diagnoses:  COPD exacerbation (HCC)     Note: This dictation was prepared with Dragon dictation. Any transcriptional errors  that result from this process are unintentional     Phineas Semen, MD 12/14/19 1715

## 2019-12-14 NOTE — ED Notes (Signed)
Pt asking about wait times, states she doesn't feel well and recently broke out into a cold sweat.  VS checked at this time, pt informed there were 2 people ahead of her at this time.

## 2019-12-14 NOTE — ED Notes (Signed)
RN has updated patient daughter on status.

## 2019-12-14 NOTE — ED Triage Notes (Signed)
Patient c/o increased coughing yesterday at home and put on her prn home O2 and felt better. Patient arrived without O2 and was at 82-83%. Patient went up to 96% on 2-3 liters O2 via [place].

## 2019-12-14 NOTE — Discharge Instructions (Signed)
Please seek medical attention for any high fevers, chest pain, shortness of breath, change in behavior, persistent vomiting, bloody stool or any other new or concerning symptoms.  

## 2020-01-01 ENCOUNTER — Other Ambulatory Visit: Payer: Self-pay | Admitting: Anesthesiology

## 2020-01-01 DIAGNOSIS — M545 Low back pain, unspecified: Secondary | ICD-10-CM

## 2020-01-15 ENCOUNTER — Ambulatory Visit: Admitting: Adult Health

## 2020-01-20 ENCOUNTER — Ambulatory Visit (INDEPENDENT_AMBULATORY_CARE_PROVIDER_SITE_OTHER): Payer: Medicare Other | Admitting: Adult Health

## 2020-01-20 ENCOUNTER — Encounter: Payer: Self-pay | Admitting: Adult Health

## 2020-01-20 ENCOUNTER — Other Ambulatory Visit: Payer: Self-pay

## 2020-01-20 VITALS — BP 119/71 | HR 82 | Temp 97.3°F | Resp 16 | Ht 63.0 in | Wt 189.4 lb

## 2020-01-20 DIAGNOSIS — K219 Gastro-esophageal reflux disease without esophagitis: Secondary | ICD-10-CM

## 2020-01-20 DIAGNOSIS — M79676 Pain in unspecified toe(s): Secondary | ICD-10-CM | POA: Diagnosis not present

## 2020-01-20 DIAGNOSIS — M159 Polyosteoarthritis, unspecified: Secondary | ICD-10-CM

## 2020-01-20 DIAGNOSIS — L02422 Furuncle of left axilla: Secondary | ICD-10-CM

## 2020-01-20 DIAGNOSIS — M8949 Other hypertrophic osteoarthropathy, multiple sites: Secondary | ICD-10-CM

## 2020-01-20 DIAGNOSIS — G2581 Restless legs syndrome: Secondary | ICD-10-CM

## 2020-01-20 DIAGNOSIS — F339 Major depressive disorder, recurrent, unspecified: Secondary | ICD-10-CM

## 2020-01-20 DIAGNOSIS — J449 Chronic obstructive pulmonary disease, unspecified: Secondary | ICD-10-CM

## 2020-01-20 MED ORDER — ESOMEPRAZOLE MAGNESIUM 40 MG PO CPDR: 40.0000 mg | DELAYED_RELEASE_CAPSULE | Freq: Every day | ORAL | 2 refills | Status: DC

## 2020-01-20 MED ORDER — DOXYCYCLINE HYCLATE 100 MG PO TABS: 100.0000 mg | ORAL_TABLET | Freq: Two times a day (BID) | ORAL | 0 refills | Status: DC

## 2020-01-20 MED ORDER — FLUTICASONE FUROATE-VILANTEROL 200-25 MCG/INH IN AEPB: 1.0000 | INHALATION_SPRAY | Freq: Every day | RESPIRATORY_TRACT | 3 refills | Status: DC

## 2020-01-20 MED ORDER — CITALOPRAM HYDROBROMIDE 40 MG PO TABS: 40.0000 mg | ORAL_TABLET | Freq: Every day | ORAL | 3 refills | Status: DC

## 2020-01-20 NOTE — Progress Notes (Signed)
[facility] [address] [place], [place] [address]  Internal MEDICINE  Office Visit Note  Patient Name: Jennifer Hayes  [id number]  [id number]  Date of Service: 01/20/2020  Chief Complaint  Patient presents with  . Gastroesophageal Reflux  . Depression  . Arthritis  . Recurrent Skin Infections    under both arms   . Nail Problem    possible referral to podiatry     HPI  PT is here for follow up on GERD, Depression, Arthritis and boils under arms.  She reports she is doing well at this time.  She takes Celexa for her depression and is currently well controlled.  She does have arthritis, and complains of pain, but sees pain management. The boils under her arms resolved, but seem to be returning at this time.    Current Medication: Outpatient Encounter Medications as of 01/20/2020  Medication Sig  . Cholecalciferol (VITAMIN D3) 125 MCG (5000 UT) CAPS Take by mouth.  . citalopram (CELEXA) 40 MG tablet Take 1 tablet (40 mg total) by mouth daily.  . cyclobenzaprine (FLEXERIL) 10 MG tablet Take 1 tablet (10 mg total) by mouth at bedtime.  Marland Kitchen esomeprazole (NEXIUM) 40 MG capsule Take 1 capsule (40 mg total) by mouth daily.  . fluticasone furoate-vilanterol (BREO ELLIPTA) 200-25 MCG/INH AEPB Inhale 1 puff into the lungs daily.  . hydrOXYzine (VISTARIL) 25 MG capsule TK 1 C PO TID PRN FOR ITCHING  . ibuprofen (ADVIL) 800 MG tablet Take 1 tablet (800 mg total) by mouth 3 (three) times daily.  . Ipratropium-Albuterol (COMBIVENT) 20-100 MCG/ACT AERS respimat Inhale into the lungs.  Marland Kitchen ipratropium-albuterol (DUONEB) 0.5-2.5 (3) MG/3ML SOLN Take 3 mLs by nebulization 2 (two) times a day as needed.  Marland Kitchen levocetirizine (XYZAL) 5 MG tablet Take by mouth.  . Misc. Devices MISC Portable oxygen  . nitroGLYCERIN (NITROSTAT) 0.4 MG SL tablet Place under the tongue.  . [DISCONTINUED] citalopram (CELEXA) 40 MG tablet Take 1 tablet (40 mg total) by mouth daily.  . [DISCONTINUED]  esomeprazole (NEXIUM) 40 MG capsule Take 1 capsule (40 mg total) by mouth daily.  . [DISCONTINUED] fluticasone furoate-vilanterol (BREO ELLIPTA) 200-25 MCG/INH AEPB Inhale into the lungs.  . doxycycline (VIBRA-TABS) 100 MG tablet Take 1 tablet (100 mg total) by mouth 2 (two) times daily.  . [DISCONTINUED] predniSONE (STERAPRED UNI-PAK 21 TAB) 10 MG (21) TBPK tablet Per packaging instructions (Patient not taking: Reported on 01/20/2020)  . [DISCONTINUED] sulfamethoxazole-trimethoprim (BACTRIM DS) 800-160 MG tablet Take 1 tablet by mouth 2 (two) times daily. (Patient not taking: Reported on 01/20/2020)   No facility-administered encounter medications on file as of 01/20/2020.    Surgical History: Past Surgical History:  Procedure Laterality Date  . APPENDECTOMY    . BACK SURGERY      Medical History: Past Medical History:  Diagnosis Date  . Arthritis   . CHF (congestive heart failure) (HCC)   . COPD (chronic obstructive pulmonary disease) (HCC)   . Depression   . GERD (gastroesophageal reflux disease)   . Stroke [facility])     Family History: Family History  Family history unknown: Yes    Social History   Socioeconomic History  . Marital status: Married    Spouse name: Not on file  . Number of children: Not on file  . Years of education: Not on file  . Highest education level: Not on file  Occupational History  . Not on file  Tobacco Use  . Smoking status: Current Every Day  Smoker    Packs/day: 0.50  . Smokeless tobacco: Never Used  Substance and Sexual Activity  . Alcohol use: Yes    Comment: occasionally  . Drug use: Never  . Sexual activity: Not on file  Other Topics Concern  . Not on file  Social History Narrative  . Not on file   Social Determinants of Health   Financial Resource Strain:   . Difficulty of Paying Living Expenses:   Food Insecurity:   . Worried About Charity fundraiser in the Last Year:   . Arboriculturist in the Last Year:   Transportation  Needs:   . Film/video editor (Medical):   Marland Kitchen Lack of Transportation (Non-Medical):   Physical Activity:   . Days of Exercise per Week:   . Minutes of Exercise per Session:   Stress:   . Feeling of Stress :   Social Connections:   . Frequency of Communication with Friends and Family:   . Frequency of Social Gatherings with Friends and Family:   . Attends Religious Services:   . Active Member of Clubs or Organizations:   . Attends Archivist Meetings:   Marland Kitchen Marital Status:   Intimate Partner Violence:   . Fear of Current or Ex-Partner:   . Emotionally Abused:   Marland Kitchen Physically Abused:   . Sexually Abused:       Review of Systems  Constitutional: Negative for chills, fatigue and unexpected weight change.  HENT: Negative for congestion, rhinorrhea, sneezing and sore throat.   Eyes: Negative for photophobia, pain and redness.  Respiratory: Negative for cough, chest tightness and shortness of breath.   Cardiovascular: Negative for chest pain and palpitations.  Gastrointestinal: Negative for abdominal pain, constipation, diarrhea, nausea and vomiting.  Endocrine: Negative.   Genitourinary: Negative for dysuria and frequency.  Musculoskeletal: Negative for arthralgias, back pain, joint swelling and neck pain.  Skin: Negative for rash.  Allergic/Immunologic: Negative.   Neurological: Negative for tremors and numbness.  Hematological: Negative for adenopathy. Does not bruise/bleed easily.  Psychiatric/Behavioral: Negative for behavioral problems and sleep disturbance. The patient is not nervous/anxious.     Vital Signs: BP 119/71   Pulse 82   Temp (!) 97.3 F (36.3 C)   Resp 16   Ht 5\' 3"  (1.6 m)   Wt 189 lb 6.4 oz (85.9 kg)   SpO2 94%   BMI 33.55 kg/m    Physical Exam Vitals and nursing note reviewed.  Constitutional:      General: She is not in acute distress.    Appearance: She is well-developed. She is not diaphoretic.  HENT:     Head: Normocephalic and  atraumatic.     Mouth/Throat:     Pharynx: No oropharyngeal exudate.  Eyes:     Pupils: Pupils are equal, round, and reactive to light.  Neck:     Thyroid: No thyromegaly.     Vascular: No JVD.     Trachea: No tracheal deviation.  Cardiovascular:     Rate and Rhythm: Normal rate and regular rhythm.     Heart sounds: Normal heart sounds. No murmur. No friction rub. No gallop.   Pulmonary:     Effort: Pulmonary effort is normal. No respiratory distress.     Breath sounds: Normal breath sounds. No wheezing or rales.  Chest:     Chest wall: No tenderness.  Abdominal:     Palpations: Abdomen is soft.     Tenderness: There is no abdominal tenderness.  There is no guarding.  Musculoskeletal:        General: Normal range of motion.     Cervical back: Normal range of motion and neck supple.  Lymphadenopathy:     Cervical: No cervical adenopathy.  Skin:    General: Skin is warm and dry.  Neurological:     Mental Status: She is alert and oriented to person, place, and time.     Cranial Nerves: No cranial nerve deficit.  Psychiatric:        Behavior: Behavior normal.        Thought Content: Thought content normal.        Judgment: Judgment normal.    Assessment/Plan: 1. Gastroesophageal reflux disease without esophagitis Continue Nexium as prescribed.  - esomeprazole (NEXIUM) 40 MG capsule; Take 1 capsule (40 mg total) by mouth daily.  Dispense: 90 capsule; Refill: 2  2. Chronic obstructive pulmonary disease, unspecified COPD type (HCC) Severe disease, continue current treatment.  3. Depression, recurrent (HCC) Refilled Celexa, good relief of symptoms. Continue celexa - citalopram (CELEXA) 40 MG tablet; Take 1 tablet (40 mg total) by mouth daily.  Dispense: 90 tablet; Refill: 3  4. Pain of toe, unspecified laterality PT is requesting referral to podiatry.  - Ambulatory referral to Podiatry  5. Furuncle of left axilla Advised patient to take entire course of antibiotics as  prescribed with food. Pt should return to clinic in 7-10 days if symptoms fail to improve or new symptoms develop.  - doxycycline (VIBRA-TABS) 100 MG tablet; Take 1 tablet (100 mg total) by mouth 2 (two) times daily.  Dispense: 60 tablet; Refill: 0  6. Restless leg Continue current management, good symptom management.  7. Primary osteoarthritis involving multiple joints Chronic pain, currently managed well. Continue to follow   General Counseling: Ricky Stabs understanding of the findings of todays visit and agrees with plan of treatment. I have discussed any further diagnostic evaluation that may be needed or ordered today. We also reviewed her medications today. she has been encouraged to call the office with any questions or concerns that should arise related to todays visit.    Orders Placed This Encounter  Procedures  . Ambulatory referral to Podiatry    Meds ordered this encounter  Medications  . esomeprazole (NEXIUM) 40 MG capsule    Sig: Take 1 capsule (40 mg total) by mouth daily.    Dispense:  90 capsule    Refill:  2  . citalopram (CELEXA) 40 MG tablet    Sig: Take 1 tablet (40 mg total) by mouth daily.    Dispense:  90 tablet    Refill:  3  . fluticasone furoate-vilanterol (BREO ELLIPTA) 200-25 MCG/INH AEPB    Sig: Inhale 1 puff into the lungs daily.    Dispense:  60 each    Refill:  3    Please provide 90 day supply  . doxycycline (VIBRA-TABS) 100 MG tablet    Sig: Take 1 tablet (100 mg total) by mouth 2 (two) times daily.    Dispense:  60 tablet    Refill:  0    Time spent: 30 Minutes   This patient was seen by Blima Ledger AGNP-C in Collaboration with Dr Lyndon Code as a part of collaborative care agreement     Johnna Acosta AGNP-C Internal medicine

## 2020-01-27 ENCOUNTER — Other Ambulatory Visit: Payer: Self-pay

## 2020-01-27 ENCOUNTER — Encounter: Payer: Self-pay | Admitting: Emergency Medicine

## 2020-01-27 ENCOUNTER — Emergency Department: Payer: Medicare Other

## 2020-01-27 ENCOUNTER — Emergency Department
Admission: EM | Admit: 2020-01-27 | Discharge: 2020-01-27 | Disposition: A | Discharge status: Home / Self Care | Payer: Medicare Other | Attending: Emergency Medicine | Admitting: Emergency Medicine

## 2020-01-27 DIAGNOSIS — Z5321 Procedure and treatment not carried out due to patient leaving prior to being seen by health care provider: Secondary | ICD-10-CM | POA: Diagnosis not present

## 2020-01-27 DIAGNOSIS — R0602 Shortness of breath: Secondary | ICD-10-CM | POA: Diagnosis not present

## 2020-01-27 DIAGNOSIS — R05 Cough: Secondary | ICD-10-CM | POA: Insufficient documentation

## 2020-01-27 DIAGNOSIS — J449 Chronic obstructive pulmonary disease, unspecified: Secondary | ICD-10-CM | POA: Diagnosis not present

## 2020-01-27 LAB — BASIC METABOLIC PANEL
Anion gap: 8 (ref 5–15)
BUN: 18 mg/dL (ref 8–23)
CO2: 25 mmol/L (ref 22–32)
Calcium: 9.1 mg/dL (ref 8.9–10.3)
Chloride: 106 mmol/L (ref 98–111)
Creatinine, Ser: 0.7 mg/dL (ref 0.44–1.00)
GFR calc Af Amer: >60 mL/min (ref >60)
GFR calc non Af Amer: >60 mL/min (ref >60)
Glucose, Bld: 116 mg/dL — ABNORMAL HIGH (ref 70–99)
Potassium: 3.7 mmol/L (ref 3.5–5.1)
Sodium: 139 mmol/L (ref 135–145)

## 2020-01-27 LAB — CBC
HCT: 41.4 % (ref 36.0–46.0)
Hemoglobin: 13.9 g/dL (ref 12.0–15.0)
MCH: 30 pg (ref 26.0–34.0)
MCHC: 33.6 g/dL (ref 30.0–36.0)
MCV: 89.2 fL (ref 80.0–100.0)
Platelets: 194 10*3/uL (ref 150–400)
RBC: 4.64 MIL/uL (ref 3.87–5.11)
RDW: 12.4 % (ref 11.5–15.5)
WBC: 11.9 10*3/uL — ABNORMAL HIGH (ref 4.0–10.5)
nRBC: 0 % (ref 0.0–0.2)

## 2020-01-27 NOTE — ED Notes (Signed)
Patient states she is feeling much better and wants to leave.  Patient states she has oxygen and inhalers at home.  Patient states she will follow-up with her primary care provider.

## 2020-01-27 NOTE — ED Triage Notes (Addendum)
Patient here from [facility] via [employer]. Reports worsening SOB for the past few days. Increased cough and WOB with exertion. Hx of COPD. States she wears 2L [place] during the day as needed and at night.  Given 2 duonebs by EMS. Patient on 6 L O2 via mask upon arrival. O2 sat 95%.   Placed on 3L [place] upon arrival. Sats remain in mid 90s.

## 2020-01-28 ENCOUNTER — Telehealth: Payer: Self-pay | Admitting: Emergency Medicine

## 2020-01-28 NOTE — Telephone Encounter (Signed)
Called patient due to lwot to inquire about condition and follow up plans. She says she has called pcp and made appt for Monday.  I told her that the cxr showed possible pneumonia and that her pcp should look at that and decide if she needs anything prior to Monday.  She agrees to call them back now.

## 2020-01-29 ENCOUNTER — Telehealth: Payer: Self-pay

## 2020-01-29 NOTE — Telephone Encounter (Signed)
Confirmed appointment on 02/02/2020. klh

## 2020-02-02 ENCOUNTER — Ambulatory Visit (INDEPENDENT_AMBULATORY_CARE_PROVIDER_SITE_OTHER): Payer: Medicare Other | Admitting: Adult Health

## 2020-02-02 ENCOUNTER — Encounter: Payer: Self-pay | Admitting: Adult Health

## 2020-02-02 ENCOUNTER — Other Ambulatory Visit: Payer: Self-pay

## 2020-02-02 ENCOUNTER — Telehealth: Payer: Self-pay

## 2020-02-02 VITALS — BP 142/65 | HR 99 | Temp 96.7°F | Resp 16 | Ht 63.0 in | Wt 186.6 lb

## 2020-02-02 DIAGNOSIS — R0902 Hypoxemia: Secondary | ICD-10-CM | POA: Diagnosis not present

## 2020-02-02 DIAGNOSIS — F17210 Nicotine dependence, cigarettes, uncomplicated: Secondary | ICD-10-CM | POA: Diagnosis not present

## 2020-02-02 DIAGNOSIS — J189 Pneumonia, unspecified organism: Secondary | ICD-10-CM

## 2020-02-02 DIAGNOSIS — Z9981 Dependence on supplemental oxygen: Secondary | ICD-10-CM

## 2020-02-02 DIAGNOSIS — J449 Chronic obstructive pulmonary disease, unspecified: Secondary | ICD-10-CM | POA: Diagnosis not present

## 2020-02-02 MED ORDER — LEVOFLOXACIN 500 MG PO TABS: 500.0000 mg | ORAL_TABLET | Freq: Every day | ORAL | 0 refills | Status: AC

## 2020-02-02 MED ORDER — PREDNISONE 10 MG PO TABS: ORAL_TABLET | ORAL | 0 refills | Status: DC

## 2020-02-02 NOTE — Progress Notes (Signed)
[facility] [place], [place] [address]  Internal MEDICINE  Office Visit Note  Patient Name: Jennifer Hayes  [id number]  [id number]  Date of Service: 02/02/2020  Chief Complaint  Patient presents with  . Follow-up    ER follow up breathing, increased O2, question about xray, needs portable oxygen tank  . Depression  . Gastroesophageal Reflux    HPI  Pt is here for hospital follow up.  She reports on 01/27/20 that she was feeling sob.  She went to the ER, and an x ray was taken.  She reports she waited 3 hours and did not get to see a doctor so she left.  The hospital contacted her and told her to follow up with [place] immediately because her Xray showed some pneumonia.  The Xray report is as follows: 1.  New left base infiltrate suggesting pneumonia. 2. Probable chronic interstitial lung disease. Bibasilar subsegmental atelectasis and or scarring..  She is here today with a room air saturation of 84% at rest. Since leaving the hospital she reports increased weakness, dizziness, sob and coughing.  She denies productivity to cough or fever.        Current Medication: Outpatient Encounter Medications as of 02/02/2020  Medication Sig  . Cholecalciferol (VITAMIN D3) 125 MCG (5000 UT) CAPS Take by mouth.  . citalopram (CELEXA) 40 MG tablet Take 1 tablet (40 mg total) by mouth daily.  . cyclobenzaprine (FLEXERIL) 10 MG tablet Take 1 tablet (10 mg total) by mouth at bedtime.  Marland Kitchen esomeprazole (NEXIUM) 40 MG capsule Take 1 capsule (40 mg total) by mouth daily.  . fluticasone furoate-vilanterol (BREO ELLIPTA) 200-25 MCG/INH AEPB Inhale 1 puff into the lungs daily.  . hydrOXYzine (VISTARIL) 25 MG capsule TK 1 C PO TID PRN FOR ITCHING  . ibuprofen (ADVIL) 800 MG tablet Take 1 tablet (800 mg total) by mouth 3 (three) times daily.  . Ipratropium-Albuterol (COMBIVENT) 20-100 MCG/ACT AERS respimat Inhale into the lungs.  Marland Kitchen ipratropium-albuterol (DUONEB) 0.5-2.5 (3) MG/3ML  SOLN Take 3 mLs by nebulization 2 (two) times a day as needed.  Marland Kitchen levocetirizine (XYZAL) 5 MG tablet Take by mouth.  . nitroGLYCERIN (NITROSTAT) 0.4 MG SL tablet Place under the tongue.  . OXYGEN Inhale into the lungs. 4 liters  . levofloxacin (LEVAQUIN) 500 MG tablet Take 1 tablet (500 mg total) by mouth daily for 10 days.  . predniSONE (DELTASONE) 10 MG tablet Use per dose pack  . [DISCONTINUED] doxycycline (VIBRA-TABS) 100 MG tablet Take 1 tablet (100 mg total) by mouth 2 (two) times daily.  . [DISCONTINUED] Misc. Devices MISC Portable oxygen   No facility-administered encounter medications on file as of 02/02/2020.    Surgical History: Past Surgical History:  Procedure Laterality Date  . APPENDECTOMY    . BACK SURGERY      Medical History: Past Medical History:  Diagnosis Date  . Arthritis   . CHF (congestive heart failure) ([place])   . COPD (chronic obstructive pulmonary disease) ([place])   . Depression   . GERD (gastroesophageal reflux disease)   . Stroke [facility])     Family History: Family History  Family history unknown: Yes    Social History   Socioeconomic History  . Marital status: Married    Spouse name: Not on file  . Number of children: Not on file  . Years of education: Not on file  . Highest education level: Not on file  Occupational History  . Not on file  Tobacco  Use  . Smoking status: Current Every Day Smoker    Packs/day: 0.50  . Smokeless tobacco: Never Used  Substance and Sexual Activity  . Alcohol use: Yes    Comment: occasionally  . Drug use: Never  . Sexual activity: Not on file  Other Topics Concern  . Not on file  Social History Narrative  . Not on file   Social Determinants of Health   Financial Resource Strain:   . Difficulty of Paying Living Expenses:   Food Insecurity:   . Worried About Programme researcher, broadcasting/film/video in the Last Year:   . Barista in the Last Year:   Transportation Needs:   . Freight forwarder (Medical):   Marland Kitchen  Lack of Transportation (Non-Medical):   Physical Activity:   . Days of Exercise per Week:   . Minutes of Exercise per Session:   Stress:   . Feeling of Stress :   Social Connections:   . Frequency of Communication with Friends and Family:   . Frequency of Social Gatherings with Friends and Family:   . Attends Religious Services:   . Active Member of Clubs or Organizations:   . Attends Banker Meetings:   Marland Kitchen Marital Status:   Intimate Partner Violence:   . Fear of Current or Ex-Partner:   . Emotionally Abused:   Marland Kitchen Physically Abused:   . Sexually Abused:       Review of Systems  Constitutional: Negative for chills, fatigue and unexpected weight change.  HENT: Negative for congestion, rhinorrhea, sneezing and sore throat.   Eyes: Negative for photophobia, pain and redness.  Respiratory: Positive for cough and shortness of breath. Negative for chest tightness.   Cardiovascular: Negative for chest pain and palpitations.  Gastrointestinal: Negative for abdominal pain, constipation, diarrhea, nausea and vomiting.  Endocrine: Negative.   Genitourinary: Negative for dysuria and frequency.  Musculoskeletal: Negative for arthralgias, back pain, joint swelling and neck pain.  Skin: Negative for rash.  Allergic/Immunologic: Negative.   Neurological: Negative for tremors and numbness.  Hematological: Negative for adenopathy. Does not bruise/bleed easily.  Psychiatric/Behavioral: Negative for behavioral problems and sleep disturbance. The patient is not nervous/anxious.     Vital Signs: BP (!) 142/65   Pulse 99   Temp (!) 96.7 F (35.9 C)   Resp 16   Ht 5\' 3"  (1.6 m)   Wt 186 lb 9.6 oz (84.6 kg)   SpO2 (!) 84%   BMI 33.05 kg/m    Physical Exam Vitals and nursing note reviewed.  Constitutional:      General: She is not in acute distress.    Appearance: She is well-developed. She is not diaphoretic.  HENT:     Head: Normocephalic and atraumatic.     Mouth/Throat:      Pharynx: No oropharyngeal exudate.  Eyes:     Pupils: Pupils are equal, round, and reactive to light.  Neck:     Thyroid: No thyromegaly.     Vascular: No JVD.     Trachea: No tracheal deviation.  Cardiovascular:     Rate and Rhythm: Normal rate and regular rhythm.     Heart sounds: Normal heart sounds. No murmur. No friction rub. No gallop.   Pulmonary:     Effort: Respiratory distress present.     Breath sounds: Wheezing and rhonchi present. No rales.  Chest:     Chest wall: No tenderness.  Abdominal:     Palpations: Abdomen is soft.  Tenderness: There is no abdominal tenderness. There is no guarding.  Musculoskeletal:        General: Normal range of motion.     Cervical back: Normal range of motion and neck supple.  Lymphadenopathy:     Cervical: No cervical adenopathy.  Skin:    General: Skin is warm and dry.  Neurological:     Mental Status: She is alert and oriented to person, place, and time.     Cranial Nerves: No cranial nerve deficit.  Psychiatric:        Behavior: Behavior normal.        Thought Content: Thought content normal.        Judgment: Judgment normal.    Assessment/Plan: 1. Pneumonia of left lower lobe due to infectious organism Advised patient to take entire course of antibiotics as prescribed with food. Pt should return to clinic in 7-10 days if symptoms fail to improve or new symptoms develop. Recheck CXR in 10 days.  - levofloxacin (LEVAQUIN) 500 MG tablet; Take 1 tablet (500 mg total) by mouth daily for 10 days.  Dispense: 10 tablet; Refill: 0 - predniSONE (DELTASONE) 10 MG tablet; Use per dose pack  Dispense: 21 tablet; Refill: 0  2. Hypoxia 84% on room air at rest in office.  Patient should be on oxygen continuously.  She will need portable tanks, a a portable concentrator when she can get it.  - PR PORTABLE OXYGEN CONCENTRATOR  3. Chronic obstructive pulmonary disease, unspecified COPD type (HCC) Continue nebs and other meds as  directed.   4. Supplemental oxygen dependent Continue to use oxygen 4 LPM at all times.   5. Cigarette nicotine dependence without complication Smoking cessation counseling: 1. Pt acknowledges the risks of long term smoking, she will try to quite smoking. 2. Options for different medications including nicotine products, chewing gum, patch etc, Wellbutrin and Chantix is discussed 3. Goal and date of compete cessation is discussed 4. Total time spent in smoking cessation is 15 min.   General Counseling: paradise vensel understanding of the findings of todays visit and agrees with plan of treatment. I have discussed any further diagnostic evaluation that may be needed or ordered today. We also reviewed her medications today. she has been encouraged to call the office with any questions or concerns that should arise related to todays visit.    Orders Placed This Encounter  Procedures  . PR PORTABLE OXYGEN CONCENTRATOR    Meds ordered this encounter  Medications  . levofloxacin (LEVAQUIN) 500 MG tablet    Sig: Take 1 tablet (500 mg total) by mouth daily for 10 days.    Dispense:  10 tablet    Refill:  0  . predniSONE (DELTASONE) 10 MG tablet    Sig: Use per dose pack    Dispense:  21 tablet    Refill:  0    Time spent: 30 Minutes   This patient was seen by Blima Ledger AGNP-C in Collaboration with Dr Lyndon Code as a part of collaborative care agreement     Johnna Acosta AGNP-C Internal medicine

## 2020-02-02 NOTE — Telephone Encounter (Signed)
Patient did not stop to check out was unable to scheduled follow up appointment with patient. klh

## 2020-02-17 ENCOUNTER — Ambulatory Visit: Payer: Self-pay | Admitting: Podiatry

## 2020-03-02 ENCOUNTER — Ambulatory Visit (INDEPENDENT_AMBULATORY_CARE_PROVIDER_SITE_OTHER): Payer: Medicare Other | Admitting: Podiatry

## 2020-03-02 ENCOUNTER — Other Ambulatory Visit: Payer: Self-pay

## 2020-03-02 DIAGNOSIS — B351 Tinea unguium: Secondary | ICD-10-CM

## 2020-03-02 DIAGNOSIS — M79675 Pain in left toe(s): Secondary | ICD-10-CM | POA: Diagnosis not present

## 2020-03-04 NOTE — Progress Notes (Signed)
   Subjective: 79 y.o. female presenting today as a new patient with a chief complaint of intermittent throbbing pain to the left great toe that began about three months ago. She reports associated thickening and discoloration of the nail and is concerned for possible fungus. She has not had any treatment and denies any modifying factors. Patient is here for further evaluation and treatment.   Past Medical History:  Diagnosis Date  . Arthritis   . CHF (congestive heart failure) (HCC)   . COPD (chronic obstructive pulmonary disease) (HCC)   . Depression   . GERD (gastroesophageal reflux disease)   . Stroke [facility])     Objective: Physical Exam General: The patient is alert and oriented x3 in no acute distress.  Dermatology: Hyperkeratotic, discolored, thickened, onychodystrophy noted to the left great toenail. Skin is warm, dry and supple bilateral lower extremities. Negative for open lesions or macerations.  Vascular: Palpable pedal pulses bilaterally. No edema or erythema noted. Capillary refill within normal limits.  Neurological: Epicritic and protective threshold grossly intact bilaterally.   Musculoskeletal Exam: Range of motion within normal limits to all pedal and ankle joints bilateral. Muscle strength 5/5 in all groups bilateral.   Assessment: #1 Onychomycosis left great toenail #2 Hyperkeratotic nails left great toenail   Plan of Care:  #1 Patient was evaluated. #2 Mechanical debridement of the left great toenail performed using a nail nipper. Filed with dremel without incident.  #3 OTC Tolcylen antifungal topical solution provided to patient to apply daily.  #4 Return to clinic as needed.    Felecia Shelling, DPM [facility]  Dr. Felecia Shelling, DPM    [address]                                        [place], [place] [address]                Office [phone]  Fax [phone]

## 2020-03-09 ENCOUNTER — Encounter: Payer: Self-pay | Admitting: Emergency Medicine

## 2020-03-09 ENCOUNTER — Emergency Department
Admission: EM | Admit: 2020-03-09 | Discharge: 2020-03-09 | Disposition: A | Discharge status: Home / Self Care | Payer: Medicare Other | Attending: Emergency Medicine | Admitting: Emergency Medicine

## 2020-03-09 ENCOUNTER — Other Ambulatory Visit: Payer: Self-pay

## 2020-03-09 DIAGNOSIS — L02412 Cutaneous abscess of left axilla: Secondary | ICD-10-CM | POA: Insufficient documentation

## 2020-03-09 DIAGNOSIS — F17210 Nicotine dependence, cigarettes, uncomplicated: Secondary | ICD-10-CM | POA: Insufficient documentation

## 2020-03-09 MED ORDER — CLINDAMYCIN HCL 150 MG PO CAPS: 300.0000 mg | ORAL_CAPSULE | Freq: Three times a day (TID) | ORAL | 0 refills | Status: DC

## 2020-03-09 MED ORDER — HYDROCODONE-ACETAMINOPHEN 5-325 MG PO TABS: 1.0000 | ORAL_TABLET | Freq: Four times a day (QID) | ORAL | 0 refills | Status: DC | PRN

## 2020-03-09 NOTE — ED Triage Notes (Signed)
First nurse note- here for abscess to left axilla. Is draining.

## 2020-03-09 NOTE — ED Triage Notes (Signed)
Here for left axilla abscess.  No fever.

## 2020-03-09 NOTE — Discharge Instructions (Addendum)
Apply warm compress to the left axilla.  Take your antibiotic and pain medication as prescribed.  Return if worsening.

## 2020-03-09 NOTE — ED Notes (Signed)
See triage note  Presents with possible abscess area under left arm  States she noticed the area started a couple of days ago

## 2020-03-09 NOTE — ED Provider Notes (Signed)
[facility] Emergency Department Provider Note  ____________________________________________   First MD Initiated Contact with Patient 03/09/20 1512     (approximate)  I have reviewed the triage vital signs and the nursing notes.   HISTORY  Chief Complaint Abscess    HPI Jennifer Hayes is a 79 y.o. female presents emergency department complaining of a abscess to the left axilla.  Patient states that she tends to get infectious when she scratches her skin.  No fever or chills at this time.  Pain scale is 10/10    Past Medical History:  Diagnosis Date  . Arthritis   . CHF (congestive heart failure) ([place])   . COPD (chronic obstructive pulmonary disease) ([place])   . Depression   . GERD (gastroesophageal reflux disease)   . Stroke [facility])     Patient Active Problem List   Diagnosis Date Noted  . MDD (major depressive disorder) 06/02/2019  . Cigarette nicotine dependence without complication 67/09/4579  . Major depressive disorder in full remission ([place]) 04/07/2019  . HTN (hypertension) 12/26/2018  . UTI (urinary tract infection) 11/04/2018  . Chronic bilateral low back pain 05/09/2018  . 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/31/2017  . Other hyperlipidemia 10/17/2017  . Chronic pain syndrome 09/03/2017  . 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  . Lumbar post-laminectomy syndrome 09/03/2017  . Chronic pruritus 01/04/2017  . Vitamin B12 deficiency 01/01/2017  . Panlobular emphysema ([place]) 11/23/2016  . GERD (gastroesophageal reflux disease) 11/23/2016  . History of TIA (transient ischemic attack) 11/23/2016  . Insomnia 11/23/2016  . Obesity, Class I, BMI 30-34.9 11/23/2016  . Rotator cuff arthropathy, right 02/04/2016  . Bilateral patellofemoral syndrome 09/10/2015  . Primary osteoarthritis of both knees 09/10/2015  . DDD (degenerative disc disease), cervical 11/20/2014    Past  Surgical History:  Procedure Laterality Date  . APPENDECTOMY    . BACK SURGERY      Prior to Admission medications   Medication Sig Start Date End Date Taking? Authorizing Provider  aspirin 325 MG tablet Take 325 mg by mouth daily.    [provider]  Cholecalciferol (VITAMIN D3) 125 MCG (5000 UT) CAPS Take by mouth.    [provider]  citalopram (CELEXA) 40 MG tablet Take 1 tablet (40 mg total) by mouth daily. 01/20/20 01/19/21  Kendell Bane, NP  clindamycin (CLEOCIN) 150 MG capsule Take 2 capsules (300 mg total) by mouth 3 (three) times daily. 03/09/20   Dalana Pfahler, Linden Dolin, PA-C  cyclobenzaprine (FLEXERIL) 10 MG tablet Take 1 tablet (10 mg total) by mouth at bedtime. 10/15/19   Kendell Bane, NP  esomeprazole (NEXIUM) 40 MG capsule Take 1 capsule (40 mg total) by mouth daily. 01/20/20   Scarboro, Audie Clear, NP  fluticasone furoate-vilanterol (BREO ELLIPTA) 200-25 MCG/INH AEPB Inhale 1 puff into the lungs daily. 01/20/20   Kendell Bane, NP  gabapentin (NEURONTIN) 300 MG capsule Take 300 mg by mouth at bedtime.    [provider]  HYDROcodone-acetaminophen (NORCO/VICODIN) 5-325 MG tablet Take 1 tablet by mouth every 6 (six) hours as needed for moderate pain. 03/09/20   Skylan Gift, Linden Dolin, PA-C  ibuprofen (ADVIL) 800 MG tablet Take 1 tablet (800 mg total) by mouth 3 (three) times daily. 08/12/19   Kendell Bane, NP  Ipratropium-Albuterol (COMBIVENT) 20-100 MCG/ACT AERS respimat Inhale into the lungs. 06/07/18   [provider]  ipratropium-albuterol (DUONEB) 0.5-2.5 (3) MG/3ML SOLN Take 3 mLs by nebulization 2 (  two) times a day as needed. 11/12/19   Johnna Acosta, NP  oxycodone (OXY-IR) 5 MG capsule Take by mouth.    [provider]  predniSONE (DELTASONE) 10 MG tablet Use per dose pack 02/02/20   Scarboro, Coralee North, NP    Allergies Bupropion and Penicillins  Family History  Family history unknown: Yes    Social History Social History   Tobacco  Use  . Smoking status: Current Every Day Smoker    Packs/day: 0.50  . Smokeless tobacco: Never Used  Substance Use Topics  . Alcohol use: Yes    Comment: occasionally  . Drug use: Never    Review of Systems  Constitutional: No fever/chills Eyes: No visual changes. ENT: No sore throat. Respiratory: Denies cough Cardiovascular: Denies chest pain Gastrointestinal: Denies abdominal pain Genitourinary: Negative for dysuria. Musculoskeletal: Negative for back pain. Skin: Negative for rash.  Positive for presents with Psychiatric: no mood changes,     ____________________________________________   PHYSICAL EXAM:  VITAL SIGNS: ED Triage Vitals  Enc Vitals Group     BP 03/09/20 1514 (!) 148/88     Pulse Rate 03/09/20 1514 88     Resp 03/09/20 1514 18     Temp 03/09/20 1514 98.7 F (37.1 C)     Temp Source 03/09/20 1514 Oral     SpO2 03/09/20 1514 99 %     Weight 03/09/20 1434 180 lb (81.6 kg)     Height 03/09/20 1434 5\' 3"  (1.6 m)     Head Circumference --      Peak Flow --      Pain Score 03/09/20 1434 8     Pain Loc --      Pain Edu? --      Excl. in GC? --     Constitutional: Alert and oriented. Well appearing and in no acute distress. Eyes: Conjunctivae are normal.  Head: Atraumatic. Nose: No congestion/rhinnorhea. Mouth/Throat: Mucous membranes are moist.   Neck:  supple no lymphadenopathy noted Cardiovascular: Normal rate, regular rhythm. Heart sounds are normal Respiratory: Normal respiratory effort.  No retractions, lungs c t a  GU: deferred Musculoskeletal: FROM all extremities, warm and well perfused Neurologic:  Normal speech and language.  Skin:  Skin is warm, dry and intact. No rash noted.  Positive for 2 small abscesses noted in the left axilla, these do not appear to be vesicular as in shingles, areas are tender to palpation and do not feel fluctuant at this time. Psychiatric: Mood and affect are normal. Speech and behavior are  normal.  ____________________________________________   LABS (all labs ordered are listed, but only abnormal results are displayed)  Labs Reviewed - No data to display ____________________________________________   ____________________________________________  RADIOLOGY    ____________________________________________   PROCEDURES  Procedure(s) performed: No  Procedures    ____________________________________________   INITIAL IMPRESSION / ASSESSMENT AND PLAN / ED COURSE  Pertinent labs & imaging results that were available during my care of the patient were reviewed by me and considered in my medical decision making (see chart for details).   Patient 79 year old female presents emergency department with concerns of abscess to left axilla, see HPI  Physical exam is consistent with 2 small abscesses in the left axilla which are not purulent or fluctuant.  Area does not appear to be shingles.  Explained findings to the patient.  She is placed on clindamycin and Vicodin.  She is to follow-up with her regular doctor if not improved in 2 days.  Return emergency department worsening.  She is discharged stable condition.    Saara Kijowski Espey was evaluated in Emergency Department on 03/09/2020 for the symptoms described in the history of present illness. She was evaluated in the context of the global COVID-19 pandemic, which necessitated consideration that the patient might be at risk for infection with the SARS-CoV-2 virus that causes COVID-19. Institutional protocols and algorithms that pertain to the evaluation of patients at risk for COVID-19 are in a state of rapid change based on information released by regulatory bodies including the CDC and federal and state organizations. These policies and algorithms were followed during the patient's care in the ED.   As part of my medical decision making, I reviewed the following data within the electronic MEDICAL RECORD NUMBER Nursing  notes reviewed and incorporated, Old chart reviewed, Notes from prior ED visits and [place] Controlled Substance Database  ____________________________________________   FINAL CLINICAL IMPRESSION(S) / ED DIAGNOSES  Final diagnoses:  Abscess of left axilla      NEW MEDICATIONS STARTED DURING THIS VISIT:  New Prescriptions   CLINDAMYCIN (CLEOCIN) 150 MG CAPSULE    Take 2 capsules (300 mg total) by mouth 3 (three) times daily.   HYDROCODONE-ACETAMINOPHEN (NORCO/VICODIN) 5-325 MG TABLET    Take 1 tablet by mouth every 6 (six) hours as needed for moderate pain.     Note:  This document was prepared using Dragon voice recognition software and may include unintentional dictation errors.    Faythe Ghee, PA-C 03/09/20 1533    Arnaldo Natal, MD 03/09/20 [phone]

## 2020-03-18 ENCOUNTER — Telehealth: Payer: Self-pay

## 2020-03-18 NOTE — Telephone Encounter (Signed)
Called lmom informing patient of appointment on 03/22/2020. klh 

## 2020-03-22 ENCOUNTER — Ambulatory Visit
Admission: RE | Admit: 2020-03-22 | Discharge: 2020-03-22 | Disposition: A | Discharge status: Home / Self Care | Payer: Medicare Other | Source: Ambulatory Visit | Attending: Adult Health | Admitting: Adult Health

## 2020-03-22 ENCOUNTER — Ambulatory Visit
Admission: RE | Admit: 2020-03-22 | Discharge: 2020-03-22 | Disposition: A | Discharge status: Home / Self Care | Payer: Medicare Other | Attending: Adult Health | Admitting: Adult Health

## 2020-03-22 ENCOUNTER — Encounter: Payer: Self-pay | Admitting: Adult Health

## 2020-03-22 ENCOUNTER — Ambulatory Visit (INDEPENDENT_AMBULATORY_CARE_PROVIDER_SITE_OTHER): Payer: Medicare Other | Admitting: Adult Health

## 2020-03-22 ENCOUNTER — Telehealth: Payer: Self-pay

## 2020-03-22 ENCOUNTER — Other Ambulatory Visit: Payer: Self-pay

## 2020-03-22 VITALS — BP 111/55 | HR 81 | Temp 97.2°F | Resp 16 | Ht 63.0 in | Wt 187.2 lb

## 2020-03-22 DIAGNOSIS — R05 Cough: Secondary | ICD-10-CM | POA: Insufficient documentation

## 2020-03-22 DIAGNOSIS — R0602 Shortness of breath: Secondary | ICD-10-CM | POA: Diagnosis present

## 2020-03-22 DIAGNOSIS — R5383 Other fatigue: Secondary | ICD-10-CM | POA: Diagnosis present

## 2020-03-22 DIAGNOSIS — R059 Cough, unspecified: Secondary | ICD-10-CM

## 2020-03-22 DIAGNOSIS — J449 Chronic obstructive pulmonary disease, unspecified: Secondary | ICD-10-CM | POA: Diagnosis not present

## 2020-03-22 DIAGNOSIS — Z9981 Dependence on supplemental oxygen: Secondary | ICD-10-CM

## 2020-03-22 DIAGNOSIS — R55 Syncope and collapse: Secondary | ICD-10-CM | POA: Insufficient documentation

## 2020-03-22 NOTE — Progress Notes (Signed)
[facility] [address] [place], [place] [address]  Internal MEDICINE  Office Visit Note  Patient Name: Jennifer Hayes  [id number]  [id number]  Date of Service: 03/22/2020  Chief Complaint  Patient presents with  . Follow-up    ER follow up boils, blackout spells,started friday,saturday  had one yesterday,body aches  . Depression  . Gastroesophageal Reflux    HPI  Patient here for follow up.  She went to the ER for abscess of left axilla.  They gave her antibiotics without I and D.  It has since resolved and she denies any reoccurrence at this time.  She reports she has been feeling bad for a few months.  Over the last few weeks she has noticed body aches, headaches, and severe shooting pains in her head.  She also has had 3 episodes of syncope in the last 3 days.  She reports when this happens she is not out long.  She reports she has to sit for a few minutes, but then her head clears and she feels back to her baseline.  She does always have the fatigue, body aches and repeatidly says "I just don't feel good"  She reports weakness, and fatigue. She has a history of TIA in the past.      Current Medication: Outpatient Encounter Medications as of 03/22/2020  Medication Sig  . aspirin 325 MG tablet Take 325 mg by mouth daily.  . Cholecalciferol (VITAMIN D3) 125 MCG (5000 UT) CAPS Take by mouth.  . citalopram (CELEXA) 40 MG tablet Take 1 tablet (40 mg total) by mouth daily.  . clindamycin (CLEOCIN) 150 MG capsule Take 2 capsules (300 mg total) by mouth 3 (three) times daily.  . cyclobenzaprine (FLEXERIL) 10 MG tablet Take 1 tablet (10 mg total) by mouth at bedtime.  Marland Kitchen esomeprazole (NEXIUM) 40 MG capsule Take 1 capsule (40 mg total) by mouth daily.  . fluticasone furoate-vilanterol (BREO ELLIPTA) 200-25 MCG/INH AEPB Inhale 1 puff into the lungs daily.  Marland Kitchen gabapentin (NEURONTIN) 300 MG capsule Take 300 mg by mouth at bedtime.  Marland Kitchen HYDROcodone-acetaminophen  (NORCO/VICODIN) 5-325 MG tablet Take 1 tablet by mouth every 6 (six) hours as needed for moderate pain.  Marland Kitchen ibuprofen (ADVIL) 800 MG tablet Take 1 tablet (800 mg total) by mouth 3 (three) times daily.  . Ipratropium-Albuterol (COMBIVENT) 20-100 MCG/ACT AERS respimat Inhale into the lungs.  Marland Kitchen ipratropium-albuterol (DUONEB) 0.5-2.5 (3) MG/3ML SOLN Take 3 mLs by nebulization 2 (two) times a day as needed.  Marland Kitchen oxycodone (OXY-IR) 5 MG capsule Take by mouth.  . predniSONE (DELTASONE) 10 MG tablet Use per dose pack   No facility-administered encounter medications on file as of 03/22/2020.    Surgical History: Past Surgical History:  Procedure Laterality Date  . APPENDECTOMY    . BACK SURGERY      Medical History: Past Medical History:  Diagnosis Date  . Arthritis   . CHF (congestive heart failure) (H[facility])   . COPD (chronic obstructive pulmonary disease) (H[facility])   . Depression   . GERD (gastroesophageal reflux disease)   . Stroke [facility])     Family History: Family History  Family history unknown: Yes    Social History   Socioeconomic History  . Marital status: Married    Spouse name: Not on file  . Number of children: Not on file  . Years of education: Not on file  . Highest education level: Not on file  Occupational History  . Not on file  Tobacco Use  . Smoking status: Current Every Day Smoker    Packs/day: 0.50  . Smokeless tobacco: Never Used  Vaping Use  . Vaping Use: Some days  Substance and Sexual Activity  . Alcohol use: Yes    Comment: occasionally  . Drug use: Never  . Sexual activity: Not on file  Other Topics Concern  . Not on file  Social History Narrative  . Not on file   Social Determinants of Health   Financial Resource Strain:   . Difficulty of Paying Living Expenses:   Food Insecurity:   . Worried About Charity fundraiser in the Last Year:   . Arboriculturist in the Last Year:   Transportation Needs:   . Film/video editor (Medical):   Marland Kitchen Lack  of Transportation (Non-Medical):   Physical Activity:   . Days of Exercise per Week:   . Minutes of Exercise per Session:   Stress:   . Feeling of Stress :   Social Connections:   . Frequency of Communication with Friends and Family:   . Frequency of Social Gatherings with Friends and Family:   . Attends Religious Services:   . Active Member of Clubs or Organizations:   . Attends Archivist Meetings:   Marland Kitchen Marital Status:   Intimate Partner Violence:   . Fear of Current or Ex-Partner:   . Emotionally Abused:   Marland Kitchen Physically Abused:   . Sexually Abused:       Review of Systems  Constitutional: Negative for chills, fatigue and unexpected weight change.  HENT: Negative for congestion, rhinorrhea, sneezing and sore throat.   Eyes: Negative for photophobia, pain and redness.  Respiratory: Negative for cough, chest tightness and shortness of breath.   Cardiovascular: Negative for chest pain and palpitations.  Gastrointestinal: Negative for abdominal pain, constipation, diarrhea, nausea and vomiting.  Endocrine: Negative.   Genitourinary: Negative for dysuria and frequency.  Musculoskeletal: Negative for arthralgias, back pain, joint swelling and neck pain.  Skin: Negative for rash.  Allergic/Immunologic: Negative.   Neurological: Negative for tremors and numbness.  Hematological: Negative for adenopathy. Does not bruise/bleed easily.  Psychiatric/Behavioral: Negative for behavioral problems and sleep disturbance. The patient is not nervous/anxious.     Vital Signs: BP (!) 111/55   Pulse 81   Temp (!) 97.2 F (36.2 C)   Resp 16   Ht 5\' 3"  (1.6 m)   Wt 187 lb 3.2 oz (84.9 kg)   SpO2 90%   BMI 33.16 kg/m    Physical Exam Vitals and nursing note reviewed.  Constitutional:      General: She is not in acute distress.    Appearance: She is well-developed. She is not diaphoretic.  HENT:     Head: Normocephalic and atraumatic.     Mouth/Throat:     Pharynx: No  oropharyngeal exudate.  Eyes:     Pupils: Pupils are equal, round, and reactive to light.  Neck:     Thyroid: No thyromegaly.     Vascular: No JVD.     Trachea: No tracheal deviation.  Cardiovascular:     Rate and Rhythm: Normal rate and regular rhythm.     Heart sounds: Normal heart sounds. No murmur heard.  No friction rub. No gallop.   Pulmonary:     Effort: Pulmonary effort is normal. No respiratory distress.     Breath sounds: Normal breath sounds. No wheezing or rales.  Chest:     Chest wall: No  tenderness.  Abdominal:     Palpations: Abdomen is soft.     Tenderness: There is no abdominal tenderness. There is no guarding.  Musculoskeletal:        General: Normal range of motion.     Cervical back: Normal range of motion and neck supple.  Lymphadenopathy:     Cervical: No cervical adenopathy.  Skin:    General: Skin is warm and dry.  Neurological:     Mental Status: She is alert and oriented to person, place, and time.     Cranial Nerves: No cranial nerve deficit.  Psychiatric:        Behavior: Behavior normal.        Thought Content: Thought content normal.        Judgment: Judgment normal.    Assessment/Plan: 1. Syncope, unspecified syncope type Have Head CT and Carotid US as discussed.  Follow up after results.  - CT Head Wo Contrast; Future - US Carotid Bilateral; Future  2. Other fatigue - DG Chest 2 View; Future  3. Cough - DG Chest 2 View; Future  4. Chronic obstructive pulmonary disease, unspecified COPD type (HCC) Stable, continue inhalers as directed.   5. Supplemental oxygen dependent Continue to use oxygen as discussed  General Counseling: Ricky Stabs understanding of the findings of todays visit and agrees with plan of treatment. I have discussed any further diagnostic evaluation that may be needed or ordered today. We also reviewed her medications today. she has been encouraged to call the office with any questions or concerns that should  arise related to todays visit.    Orders Placed This Encounter  Procedures  . CT Head Wo Contrast    No orders of the defined types were placed in this encounter.   Time spent: 30 Minutes   This patient was seen by Blima Ledger AGNP-C in Collaboration with Dr Lyndon Code as a part of collaborative care agreement     Johnna Acosta AGNP-C Internal medicine

## 2020-03-22 NOTE — Telephone Encounter (Signed)
Patient left at check out before given after visit summary with future appointments, mailed a copy of after visit summary to patient. klh

## 2020-04-14 ENCOUNTER — Telehealth: Payer: Self-pay

## 2020-04-14 NOTE — Telephone Encounter (Signed)
Confirmed appointment on 04/16/2020. klh

## 2020-04-14 NOTE — Telephone Encounter (Signed)
Confirmed patient ultrasound appt with pt daughter 

## 2020-04-15 NOTE — Progress Notes (Signed)
Will need follow CXR or CT chest ( low dose )

## 2020-04-16 ENCOUNTER — Other Ambulatory Visit: Payer: Self-pay

## 2020-04-16 ENCOUNTER — Ambulatory Visit (INDEPENDENT_AMBULATORY_CARE_PROVIDER_SITE_OTHER): Payer: Medicare Other

## 2020-04-16 DIAGNOSIS — R55 Syncope and collapse: Secondary | ICD-10-CM | POA: Diagnosis not present

## 2020-04-22 ENCOUNTER — Telehealth: Payer: Self-pay

## 2020-04-22 NOTE — Telephone Encounter (Signed)
Called lmom informing patient of appointment on 04/26/2020. klh 

## 2020-04-23 ENCOUNTER — Telehealth: Payer: Self-pay

## 2020-04-23 NOTE — Telephone Encounter (Signed)
Confirmed appointment on 04/26/2020. klh 

## 2020-04-25 NOTE — Procedures (Signed)
[facility] [address] [place], [place] [address]  DATE OF SERVICE: April 16 2020  CAROTID DOPPLER INTERPRETATION:  Bilateral Carotid Ultrsasound and Color Doppler Examination was performed. The RIGHT CCA shows mild plaque in the vessel. The LEFT CCA shows mild plaque in the vessel. There was no intimal thickening noted in the RIGHT carotid artery. There was no intimal thickening in the LEFT carotid artery.  The RIGHT CCA shows peak systolic velocity of 92 cm per second. The end diastolic velocity is 27 cm per second on the RIGHT side. The RIGHT ICA shows peak systolic velocity of 126 per second. RIGHT sided ICA end diastolic velocity is 44 cm per second. The RIGHT ECA shows a peak systolic velocity of 135 cm per second. The ICA/CCA ratio is calculated to be 1.1. This suggests 50 to 69% stenosis. The Vertebral Artery shows antegrade flow.  The LEFT CCA shows peak systolic velocity of 93 cm per second. The end diastolic velocity is 22 cm per second on the LEFT side. The LEFT ICA shows peak systolic velocity of 110 per second. LEFT sided ICA end diastolic velocity is 24 cm per second. The LEFT ECA shows a peak systolic velocity of 118 cm per second. The ICA/CCA ratio is calculated to be 0.92. This suggests less than 50% stenosis. The Vertebral Artery shows antegrade flow.   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 The LEFT CAROTID shows less than 50% stenosis.  There is mild plaque formation noted on the LEFT and mild plaque on the RIGHT  side. Consider a repeat Carotid doppler if clinical situation and symptoms warrant in 6-12 months. Patient should be encouraged to change lifestyles such as smoking cessation, regular exercise and dietary modification. Use of statins in the right clinical setting and ASA is encouraged.  Yevonne Pax, MD [facility] Pulmonary Critical Care Medicine

## 2020-04-26 ENCOUNTER — Ambulatory Visit: Admitting: Adult Health

## 2020-04-27 ENCOUNTER — Other Ambulatory Visit: Payer: Self-pay | Admitting: Adult Health

## 2020-04-30 ENCOUNTER — Telehealth: Payer: Self-pay

## 2020-04-30 NOTE — Telephone Encounter (Signed)
BILLED MISSED APPOINTMENT FEE 04/26/20 

## 2020-05-03 ENCOUNTER — Telehealth: Payer: Self-pay

## 2020-05-03 NOTE — Telephone Encounter (Signed)
Confirmed appointment on 05/05/2020 and screened for covid. klh 

## 2020-05-03 NOTE — Telephone Encounter (Signed)
Called lmom informing patient of appointment on 05/05/2020. klh 

## 2020-05-05 ENCOUNTER — Ambulatory Visit (INDEPENDENT_AMBULATORY_CARE_PROVIDER_SITE_OTHER): Payer: Medicare Other | Admitting: Adult Health

## 2020-05-05 ENCOUNTER — Encounter: Payer: Self-pay | Admitting: Adult Health

## 2020-05-05 VITALS — BP 158/84 | HR 84 | Temp 97.1°F | Resp 16 | Ht 62.0 in | Wt 180.0 lb

## 2020-05-05 DIAGNOSIS — E538 Deficiency of other specified B group vitamins: Secondary | ICD-10-CM

## 2020-05-05 DIAGNOSIS — R5383 Other fatigue: Secondary | ICD-10-CM

## 2020-05-05 DIAGNOSIS — R918 Other nonspecific abnormal finding of lung field: Secondary | ICD-10-CM | POA: Diagnosis not present

## 2020-05-05 DIAGNOSIS — G2581 Restless legs syndrome: Secondary | ICD-10-CM

## 2020-05-05 DIAGNOSIS — E782 Mixed hyperlipidemia: Secondary | ICD-10-CM | POA: Diagnosis not present

## 2020-05-05 DIAGNOSIS — M791 Myalgia, unspecified site: Secondary | ICD-10-CM

## 2020-05-05 DIAGNOSIS — E559 Vitamin D deficiency, unspecified: Secondary | ICD-10-CM

## 2020-05-05 MED ORDER — ATORVASTATIN CALCIUM 20 MG PO TABS: 20.0000 mg | ORAL_TABLET | Freq: Every day | ORAL | 1 refills | Status: DC

## 2020-05-05 MED ORDER — CYCLOBENZAPRINE HCL 10 MG PO TABS: 10.0000 mg | ORAL_TABLET | Freq: Three times a day (TID) | ORAL | 0 refills | Status: DC | PRN

## 2020-05-05 NOTE — Progress Notes (Signed)
[facility] [address] [place], [place] [address]  Internal MEDICINE  Office Visit Note  Patient Name: Kadia Abaya  [id number]  [id number]  Date of Service: 05/05/2020  Chief Complaint  Patient presents with  . Back Pain    middle of back catch like, 2 days  . Fatigue    not felt like doing anything for 4 weeks now     HPI Pt is here for a sick visit.  She is here complaining that she has difficulty falling asleep, and staying asleep.  She reports when she finally falls asleep she only sleeps for about 3 hours. She also reports 2 days of middle back pain that started when she twisted her back.  She feels like it is "catching" when she sits, stands or walks. She is also complaining of fatigue and "just not feeling well" for about a month. She has been seen by pain management.  And last filled oxycodone on 04/21/2020.  She reports she flushed those down the toilet because she didn't want to take them.  She has no plans to see Pain management again at this time and reports she is not hurting currently.   Pt had a slowly clearing infiltrate on last CXR, Instructed patient to get new film before next visit to evaluate status.    Current Medication:  Outpatient Encounter Medications as of 05/05/2020  Medication Sig  . aspirin 325 MG tablet Take 325 mg by mouth daily.  . Cholecalciferol (VITAMIN D3) 125 MCG (5000 UT) CAPS Take by mouth.  . citalopram (CELEXA) 40 MG tablet Take 1 tablet (40 mg total) by mouth daily.  Marland Kitchen esomeprazole (NEXIUM) 40 MG capsule Take 1 capsule (40 mg total) by mouth daily.  . fluticasone furoate-vilanterol (BREO ELLIPTA) 200-25 MCG/INH AEPB Inhale 1 puff into the lungs daily.  Marland Kitchen ibuprofen (ADVIL) 800 MG tablet TAKE ONE TABLET BY MOUTH THREE TIMES A DAY  . Ipratropium-Albuterol (COMBIVENT) 20-100 MCG/ACT AERS respimat Inhale into the lungs.  Marland Kitchen ipratropium-albuterol (DUONEB) 0.5-2.5 (3) MG/3ML SOLN Take 3 mLs by nebulization 2 (two) times  a day as needed.  Marland Kitchen atorvastatin (LIPITOR) 20 MG tablet Take 1 tablet (20 mg total) by mouth daily.  . clindamycin (CLEOCIN) 150 MG capsule Take 2 capsules (300 mg total) by mouth 3 (three) times daily. (Patient not taking: Reported on 05/05/2020)  . cyclobenzaprine (FLEXERIL) 10 MG tablet Take 1 tablet (10 mg total) by mouth 3 (three) times daily as needed for muscle spasms.  Marland Kitchen gabapentin (NEURONTIN) 300 MG capsule Take 300 mg by mouth at bedtime. (Patient not taking: Reported on 05/05/2020)  . HYDROcodone-acetaminophen (NORCO/VICODIN) 5-325 MG tablet Take 1 tablet by mouth every 6 (six) hours as needed for moderate pain. (Patient not taking: Reported on 05/05/2020)  . oxycodone (OXY-IR) 5 MG capsule Take by mouth. (Patient not taking: Reported on 05/05/2020)  . predniSONE (DELTASONE) 10 MG tablet Use per dose pack (Patient not taking: Reported on 05/05/2020)  . [DISCONTINUED] cyclobenzaprine (FLEXERIL) 10 MG tablet Take 1 tablet (10 mg total) by mouth at bedtime. (Patient not taking: Reported on 05/05/2020)   No facility-administered encounter medications on file as of 05/05/2020.      Medical History: Past Medical History:  Diagnosis Date  . Arthritis   . CHF (congestive heart failure) (HCC)   . COPD (chronic obstructive pulmonary disease) (HCC)   . Depression   . GERD (gastroesophageal reflux disease)   . Stroke [facility])      Vital Signs: BP Marland Kitchen)  158/84   Pulse 84   Temp (!) 97.1 F (36.2 C)   Resp 16   Ht 5\' 2"  (1.575 m)   Wt 180 lb (81.6 kg)   SpO2 96%   BMI 32.92 kg/m    Review of Systems  Constitutional: Positive for fatigue. Negative for chills and unexpected weight change.  HENT: Negative for congestion, rhinorrhea, sneezing and sore throat.   Eyes: Negative for photophobia, pain and redness.  Respiratory: Negative for cough, chest tightness and shortness of breath.   Cardiovascular: Negative for chest pain and palpitations.  Gastrointestinal: Negative for abdominal pain,  constipation, diarrhea, nausea and vomiting.  Endocrine: Negative.   Genitourinary: Negative for dysuria and frequency.  Musculoskeletal: Negative for arthralgias, back pain, joint swelling and neck pain.  Skin: Negative for rash.  Allergic/Immunologic: Negative.   Neurological: Negative for tremors and numbness.  Hematological: Negative for adenopathy. Does not bruise/bleed easily.  Psychiatric/Behavioral: Negative for behavioral problems and sleep disturbance. The patient is not nervous/anxious.     Physical Exam Vitals and nursing note reviewed.  Constitutional:      General: She is not in acute distress.    Appearance: She is well-developed. She is not diaphoretic.  HENT:     Head: Normocephalic and atraumatic.     Mouth/Throat:     Pharynx: No oropharyngeal exudate.  Eyes:     Pupils: Pupils are equal, round, and reactive to light.  Neck:     Thyroid: No thyromegaly.     Vascular: No JVD.     Trachea: No tracheal deviation.  Cardiovascular:     Rate and Rhythm: Normal rate and regular rhythm.     Heart sounds: Normal heart sounds. No murmur heard.  No friction rub. No gallop.   Pulmonary:     Effort: Pulmonary effort is normal. No respiratory distress.     Breath sounds: Normal breath sounds. No wheezing or rales.  Chest:     Chest wall: No tenderness.  Abdominal:     Palpations: Abdomen is soft.     Tenderness: There is no abdominal tenderness. There is no guarding.  Musculoskeletal:        General: Normal range of motion.     Cervical back: Normal range of motion and neck supple.  Lymphadenopathy:     Cervical: No cervical adenopathy.  Skin:    General: Skin is warm and dry.  Neurological:     Mental Status: She is alert and oriented to person, place, and time.     Cranial Nerves: No cranial nerve deficit.  Psychiatric:        Behavior: Behavior normal.        Thought Content: Thought content normal.        Judgment: Judgment normal.     Assessment/Plan: 1. Other fatigue Check labs and follow up with results available.  - Vitamin D (25 hydroxy) - B12 and Folate Panel - Fe+TIBC+Fer - CBC w/Diff/Platelet  2. Pulmonary infiltrate Check CXR to see if pulm infiltrate from last CXR is clear.  - DG Chest 2 View; Future  3. Mixed hyperlipidemia Continue Lipitor as prescribed.  - atorvastatin (LIPITOR) 20 MG tablet; Take 1 tablet (20 mg total) by mouth daily.  Dispense: 90 tablet; Refill: 1  4. B12 deficiency - B12 and Folate Panel  5. Vitamin D deficiency - Vitamin D (25 hydroxy)  6. Restless leg Continue present management, good control of symptoms.   7. Muscular pain Use ice/heat as discussed 20 minutes on,  2 hours off.  The patient is advised to apply heat intermittently (avoid sleeping on heating pad). Use flexeril as prescribed.  - cyclobenzaprine (FLEXERIL) 10 MG tablet; Take 1 tablet (10 mg total) by mouth 3 (three) times daily as needed for muscle spasms.  Dispense: 10 tablet; Refill: 0  General Counseling: Pasqualina verbalizes understanding of the findings of todays visit and agrees with plan of treatment. I have discussed any further diagnostic evaluation that may be needed or ordered today. We also reviewed her medications today. she has been encouraged to call the office with any questions or concerns that should arise related to todays visit.   Orders Placed This Encounter  Procedures  . DG Chest 2 View  . Vitamin D (25 hydroxy)  . B12 and Folate Panel  . Fe+TIBC+Fer  . CBC w/Diff/Platelet    Meds ordered this encounter  Medications  . atorvastatin (LIPITOR) 20 MG tablet    Sig: Take 1 tablet (20 mg total) by mouth daily.    Dispense:  90 tablet    Refill:  1  . cyclobenzaprine (FLEXERIL) 10 MG tablet    Sig: Take 1 tablet (10 mg total) by mouth 3 (three) times daily as needed for muscle spasms.    Dispense:  10 tablet    Refill:  0    Time spent: 30 Minutes  This patient was seen by  Blima Ledger AGNP-C in Collaboration with Dr Lyndon Code as a part of collaborative care agreement.  Johnna Acosta AGNP-C Internal Medicine

## 2020-05-17 ENCOUNTER — Other Ambulatory Visit: Payer: Self-pay

## 2020-05-17 ENCOUNTER — Telehealth: Payer: Self-pay

## 2020-05-17 MED ORDER — HYDROXYZINE HCL 10 MG PO TABS: 10.0000 mg | ORAL_TABLET | Freq: Three times a day (TID) | ORAL | 0 refills | Status: DC | PRN

## 2020-05-17 NOTE — Telephone Encounter (Signed)
Send med 

## 2020-05-17 NOTE — Telephone Encounter (Signed)
Pt called that her prior pcp gave her hydroxyzine 25mg  as per adam advised that we lower that to 10 mg and take as needed for itching

## 2020-05-20 ENCOUNTER — Ambulatory Visit: Admitting: Adult Health

## 2020-05-20 ENCOUNTER — Telehealth: Payer: Self-pay

## 2020-05-20 NOTE — Telephone Encounter (Signed)
LMOM for office visit on 8/16

## 2020-05-24 ENCOUNTER — Ambulatory Visit: Admitting: Adult Health

## 2020-07-15 ENCOUNTER — Encounter: Payer: Self-pay | Admitting: Family Medicine

## 2020-07-15 ENCOUNTER — Other Ambulatory Visit: Payer: Self-pay

## 2020-07-15 ENCOUNTER — Ambulatory Visit (INDEPENDENT_AMBULATORY_CARE_PROVIDER_SITE_OTHER): Payer: Medicare Other | Admitting: Family Medicine

## 2020-07-15 VITALS — BP 136/78 | HR 74 | Ht 62.0 in | Wt 181.6 lb

## 2020-07-15 DIAGNOSIS — E7849 Other hyperlipidemia: Secondary | ICD-10-CM

## 2020-07-15 DIAGNOSIS — M25519 Pain in unspecified shoulder: Secondary | ICD-10-CM | POA: Diagnosis not present

## 2020-07-15 DIAGNOSIS — G894 Chronic pain syndrome: Secondary | ICD-10-CM | POA: Diagnosis not present

## 2020-07-15 DIAGNOSIS — J431 Panlobular emphysema: Secondary | ICD-10-CM

## 2020-07-15 DIAGNOSIS — I10 Essential (primary) hypertension: Secondary | ICD-10-CM

## 2020-07-15 DIAGNOSIS — R413 Other amnesia: Secondary | ICD-10-CM | POA: Insufficient documentation

## 2020-07-15 DIAGNOSIS — F17210 Nicotine dependence, cigarettes, uncomplicated: Secondary | ICD-10-CM | POA: Diagnosis not present

## 2020-07-15 NOTE — Assessment & Plan Note (Signed)
-   Pt's HLD is not currently taking lipid management meds. - Education regarding hyperlipidemia was given. Questions answered.

## 2020-07-15 NOTE — Assessment & Plan Note (Signed)
Pt seen by pain management every month for inj and medication refill. Pain is managed but never completely resolved.

## 2020-07-15 NOTE — Assessment & Plan Note (Signed)
Patient's blood pressure is within the desired range. Medication side effects include: no side effects noted Continue current treatment regimen.  

## 2020-07-15 NOTE — Progress Notes (Signed)
Established Patient Office Visit  SUBJECTIVE:  Subjective  Patient ID: Jennifer Hayes, female    DOB: 27-Dec-1940  Age: 79 y.o. MRN: [id number]  CC:  Chief Complaint  Patient presents with  . New Patient (Initial Visit)    HPI Jennifer Hayes is a 79 y.o. female presenting today for a new patient evaluation.  She does go to the pain management clinic to manage the pain in her shoulder. She was given an injection and will be seen every three months. She does note some memory issues; she notices it most while driving. She has not gotten lost.   She has a history of COPD; she does have wheezing which she states is normal. She does have oxygen at home which she uses as needed. She also has an inhaler as needed; she mostly uses it if she gets sick.   Her last mammogram was 10 years ago and she does not want another mammogram. She is vaccinated against COVID19 with Moderna.     Past Medical History:  Diagnosis Date  . Arthritis   . CHF (congestive heart failure) (HCC)   . COPD (chronic obstructive pulmonary disease) (HCC)   . Depression   . GERD (gastroesophageal reflux disease)   . Stroke [facility])     Past Surgical History:  Procedure Laterality Date  . APPENDECTOMY    . BACK SURGERY      Family History  Family history unknown: Yes    Social History   Socioeconomic History  . Marital status: Widowed    Spouse name: Not on file  . Number of children: Not on file  . Years of education: Not on file  . Highest education level: Not on file  Occupational History  . Not on file  Tobacco Use  . Smoking status: Current Every Day Smoker    Packs/day: 0.50  . Smokeless tobacco: Never Used  Vaping Use  . Vaping Use: Some days  Substance and Sexual Activity  . Alcohol use: Not Currently    Comment: occasionally  . Drug use: Never  . Sexual activity: Yes  Other Topics Concern  . Not on file  Social History Narrative   She lives alone at home and has a  cat that she adores.    Social Determinants of Health   Financial Resource Strain:   . Difficulty of Paying Living Expenses: Not on file  Food Insecurity:   . Worried About Programme researcher, broadcasting/film/video in the Last Year: Not on file  . Ran Out of Food in the Last Year: Not on file  Transportation Needs:   . Lack of Transportation (Medical): Not on file  . Lack of Transportation (Non-Medical): Not on file  Physical Activity:   . Days of Exercise per Week: Not on file  . Minutes of Exercise per Session: Not on file  Stress:   . Feeling of Stress : Not on file  Social Connections:   . Frequency of Communication with Friends and Family: Not on file  . Frequency of Social Gatherings with Friends and Family: Not on file  . Attends Religious Services: Not on file  . Active Member of Clubs or Organizations: Not on file  . Attends Banker Meetings: Not on file  . Marital Status: Not on file  Intimate Partner Violence:   . Fear of Current or Ex-Partner: Not on file  . Emotionally Abused: Not on file  . Physically Abused: Not on file  . Sexually Abused:  Not on file     Current Outpatient Medications:  .  citalopram (CELEXA) 40 MG tablet, Take 1 tablet (40 mg total) by mouth daily., Disp: 90 tablet, Rfl: 3 .  esomeprazole (NEXIUM) 40 MG capsule, Take 1 capsule (40 mg total) by mouth daily., Disp: 90 capsule, Rfl: 2 .  fluticasone furoate-vilanterol (BREO ELLIPTA) 200-25 MCG/INH AEPB, Inhale 1 puff into the lungs daily., Disp: 60 each, Rfl: 3 .  ibuprofen (ADVIL) 800 MG tablet, TAKE ONE TABLET BY MOUTH THREE TIMES A DAY, Disp: 90 tablet, Rfl: 2 .  Ipratropium-Albuterol (COMBIVENT) 20-100 MCG/ACT AERS respimat, Inhale into the lungs., Disp: , Rfl:  .  ipratropium-albuterol (DUONEB) 0.5-2.5 (3) MG/3ML SOLN, Take 3 mLs by nebulization 2 (two) times a day as needed., Disp: 360 mL, Rfl: 3 .  oxycodone (OXY-IR) 5 MG capsule, Take by mouth. , Disp: , Rfl:    Allergies  Allergen Reactions  .  Bupropion Swelling    headache  . Penicillins Hives    ROS Review of Systems  Constitutional: Negative.   HENT: Negative.   Eyes: Negative.   Respiratory: Positive for wheezing.   Cardiovascular: Negative.   Gastrointestinal: Negative.   Endocrine: Negative.   Genitourinary: Negative.   Musculoskeletal: Positive for arthralgias.  Skin: Negative.   Allergic/Immunologic: Negative.   Neurological: Negative.   Hematological: Negative.   Psychiatric/Behavioral: Negative.        Memory issues  All other systems reviewed and are negative.    OBJECTIVE:    Physical Exam Vitals reviewed.  Constitutional:      Appearance: Normal appearance.  HENT:     Mouth/Throat:     Mouth: Mucous membranes are moist.  Eyes:     Pupils: Pupils are equal, round, and reactive to light.  Neck:     Vascular: No carotid bruit.  Cardiovascular:     Rate and Rhythm: Normal rate and regular rhythm.     Pulses: Normal pulses.     Heart sounds: Normal heart sounds.  Pulmonary:     Effort: Pulmonary effort is normal.     Breath sounds: Wheezing and rhonchi present.  Abdominal:     General: Bowel sounds are normal.     Palpations: Abdomen is soft. There is no hepatomegaly, splenomegaly or mass.     Tenderness: There is no abdominal tenderness.     Hernia: No hernia is present.  Musculoskeletal:        General: No tenderness.     Cervical back: Neck supple.     Right lower leg: No edema.     Left lower leg: No edema.  Skin:    Findings: No rash.  Neurological:     Mental Status: She is alert and oriented to person, place, and time.     Cranial Nerves: Cranial nerves are intact.     Motor: No weakness.  Psychiatric:        Mood and Affect: Mood and affect normal.        Behavior: Behavior normal.        Cognition and Memory: Memory is impaired.     BP 136/78   Pulse 74   Ht 5\' 2"  (1.575 m)   Wt 181 lb 9.6 oz (82.4 kg)   BMI 33.22 kg/m  Wt Readings from Last 3 Encounters:  07/15/20  181 lb 9.6 oz (82.4 kg)  05/05/20 180 lb (81.6 kg)  03/22/20 187 lb 3.2 oz (84.9 kg)    Health Maintenance Due  Topic Date  Due  . Hepatitis C Screening  Never done  . PNA vac Low Risk Adult (1 of 2 - PCV13) Never done  . INFLUENZA VACCINE  05/09/2020    There are no preventive care reminders to display for this patient.  CBC Latest Ref Rng & Units 01/27/2020 12/14/2019 06/05/2019  WBC 4.0 - 10.5 K/uL 11.9(H) 8.2 10.0  Hemoglobin 12.0 - 15.0 g/dL 16.113.9 09.612.8 04.512.7  Hematocrit 36 - 46 % 41.4 38.3 38.7  Platelets 150 - 400 K/uL 194 193 204   CMP Latest Ref Rng & Units 01/27/2020 12/14/2019 06/05/2019  Glucose 70 - 99 mg/dL 409(W116(H) 119(J108(H) 99  BUN 8 - 23 mg/dL 18 20 10   Creatinine 0.44 - 1.00 mg/dL 4.780.70 2.950.54 6.210.74  Sodium 135 - 145 mmol/L 139 138 139  Potassium 3.5 - 5.1 mmol/L 3.7 4.2 4.4  Chloride 98 - 111 mmol/L 106 104 98  CO2 22 - 32 mmol/L 25 27 26   Calcium 8.9 - 10.3 mg/dL 9.1 3.0(Q8.8(L) 9.5  Total Protein 6.0 - 8.5 g/dL - - 6.7  Total Bilirubin 0.0 - 1.2 mg/dL - - 0.5  Alkaline Phos 39 - 117 IU/L - - 81  AST 0 - 40 IU/L - - 13  ALT 0 - 32 IU/L - - 8    Lab Results  Component Value Date   TSH 3.070 06/05/2019   Lab Results  Component Value Date   ALBUMIN 4.4 06/05/2019   ANIONGAP 8 01/27/2020   Lab Results  Component Value Date   CHOL 230 (H) 06/05/2019   HDL 40 06/05/2019   LDLCALC 150 (H) 06/05/2019   Lab Results  Component Value Date   TRIG 199 (H) 06/05/2019   Lab Results  Component Value Date   HGBA1C 5.5 06/05/2019      ASSESSMENT & PLAN:   Problem List Items Addressed This Visit      Cardiovascular and Mediastinum   HTN (hypertension)    Patient's blood pressure is within the desired range. Medication side effects include: no side effects noted Continue current treatment regimen.       Relevant Orders   CBC   COMPLETE METABOLIC PANEL WITH GFR     Respiratory   Panlobular emphysema (HCC)    - The pt's COPD is stable With/Without: with treatment.    - medication-adherence assessment completed, uses supplemental O2 as needed, also using rescue inh as needed but not everyday.          Other   Chronic pain syndrome - Primary    Pt seen by pain management every month for inj and medication refill. Pain is managed but never completely resolved.       Cigarette nicotine dependence without complication    - The patient currently smokes regularly. - cut down number of cigarettes by one-half - use over-the-counter gum, patch or lozenges       Other hyperlipidemia    - Pt's HLD is not currently taking lipid management meds. - Education regarding hyperlipidemia was given. Questions answered.       Relevant Orders   Lipid Panel w/o Chol/HDL Ratio   Memory loss of unknown cause    Pt describes memory loss while driving and at home when going in between rooms, says that usually her thought is able to be recalled but that she is scared that she will get lost while driving. Clock drawing wnl and 2/3 words recalled.       Relevant Orders   Ambulatory referral to  Neurology      No orders of the defined types were placed in this encounter.   Follow-up: No follow-ups on file.    Irish Lack, FNP [facility] [address], [place], [place] [address]   By signing my name below, I, [employer], attest that this documentation has been prepared under the direction and in the presence of Irish Lack, FNP Electronically Signed: Irish Lack, FNP 07/15/20, 12:34 PM  I personally performed the services described in this documentation, which was SCRIBED in my presence. The recorded information has been reviewed and considered accurate. It has been edited as necessary during review. Irish Lack, FNP

## 2020-07-15 NOTE — Assessment & Plan Note (Signed)
-   The patient currently smokes regularly. - cut down number of cigarettes by one-half - use over-the-counter gum, patch or lozenges

## 2020-07-15 NOTE — Assessment & Plan Note (Signed)
Pt describes memory loss while driving and at home when going in between rooms, says that usually her thought is able to be recalled but that she is scared that she will get lost while driving. Clock drawing wnl and 2/3 words recalled.

## 2020-07-15 NOTE — Patient Instructions (Signed)
Smoking Tobacco Information, Adult °Smoking tobacco can be harmful to your health. Tobacco contains a poisonous (toxic), colorless chemical called nicotine. Nicotine is addictive. It changes the brain and can make it hard to stop smoking. Tobacco also has other toxic chemicals that can hurt your body and raise your risk of many cancers.  °How can smoking tobacco affect me? °Smoking tobacco puts you at risk for: °· Cancer. Smoking is most commonly associated with lung cancer, but can also lead to cancer in other parts of the body. °· Chronic obstructive pulmonary disease (COPD). This is a long-term lung condition that makes it hard to breathe. It also gets worse over time. °· High blood pressure (hypertension), heart disease, stroke, or heart attack. °· Lung infections, such as pneumonia. °· Cataracts. This is when the lenses in the eyes become clouded. °· Digestive problems. This may include peptic ulcers, heartburn, and gastroesophageal reflux disease (GERD). °· Oral health problems, such as gum disease and tooth loss. °· Loss of taste and smell. °Smoking can affect your appearance by causing: °· Wrinkles. °· Yellow or stained teeth, fingers, and fingernails. °Smoking tobacco can also affect your social life, because: °1. It may be challenging to find places to smoke when away from home. Many workplaces, restaurants, hotels, and public places are tobacco-free. °2. Smoking is expensive. This is due to the cost of tobacco and the long-term costs of treating health problems from smoking. °3. Secondhand smoke may affect those around you. Secondhand smoke can cause lung cancer, breathing problems, and heart disease. Children of smokers have a higher risk for: °? Sudden infant death syndrome (SIDS). °? Ear infections. °? Lung infections. °If you currently smoke tobacco, quitting now can help you: °· Lead a longer and healthier life. °· Look, smell, breathe, and feel better over time. °· Save money. °· Protect others from  the harms of secondhand smoke. °What actions can I take to prevent health problems? °Quit smoking ° °· Do not start smoking. Quit if you already do. °· Make a plan to quit smoking and commit to it. Look for programs to help you and ask your health care provider for recommendations and ideas. °· Set a date and write down all the reasons you want to quit. °· Let your friends and family know you are quitting so they can help and support you. Consider finding friends who also want to quit. It can be easier to quit with someone else, so that you can support each other. °· Talk with your health care provider about using nicotine replacement medicines to help you quit, such as gum, lozenges, patches, sprays, or pills. °· Do not replace cigarette smoking with electronic cigarettes, which are commonly called e-cigarettes. The safety of e-cigarettes is not known, and some may contain harmful chemicals. °· If you try to quit but return to smoking, stay positive. It is common to slip up when you first quit, so take it one day at a time. °· Be prepared for cravings. When you feel the urge to smoke, chew gum or suck on hard candy. °Lifestyle °· Stay busy and take care of your body. °· Drink enough fluid to keep your urine pale yellow. °· Get plenty of exercise and eat a healthy diet. This can help prevent weight gain after quitting. °· Monitor your eating habits. Quitting smoking can cause you to have a larger appetite than when you smoke. °· Find ways to relax. Go out with friends or family to a movie or a   restaurant where people do not smoke. °· Ask your health care provider about having regular tests (screenings) to check for cancer. This may include blood tests, imaging tests, and other tests. °· Find ways to manage your stress, such as meditation, yoga, or exercise. °Where to find support °To get support to quit smoking, consider: °· Asking your health care provider for more information and resources. °· Taking classes to  learn more about quitting smoking. °· Looking for local organizations that offer resources about quitting smoking. °· Joining a support group for people who want to quit smoking in your local community. °· Calling the smokefree.gov counselor helpline: 1-800-Quit-Now (1-800-784-8669) °Where to find more information °You may find more information about quitting smoking from: °· HelpGuide.org: www.helpguide.org °· Smokefree.gov: smokefree.gov °· American Lung Association: www.lung.org °Contact a health care provider if you: °· Have problems breathing. °· Notice that your lips, nose, or fingers turn blue. °· Have chest pain. °· Are coughing up blood. °· Feel faint or you pass out. °· Have other health changes that cause you to worry. °Summary °· Smoking tobacco can negatively affect your health, the health of those around you, your finances, and your social life. °· Do not start smoking. Quit if you already do. If you need help quitting, ask your health care provider. °· Think about joining a support group for people who want to quit smoking in your local community. There are many effective programs that will help you to quit this behavior. °This information is not intended to replace advice given to you by your health care provider. Make sure you discuss any questions you have with your health care provider. °Document Revised: 06/20/2019 Document Reviewed: 10/10/2016 °Elsevier Patient Education © 2020 [employer] ° ° °

## 2020-07-15 NOTE — Assessment & Plan Note (Signed)
-   The pt's COPD is stable With/Without: with treatment.  - medication-adherence assessment completed, uses supplemental O2 as needed, also using rescue inh as needed but not everyday.

## 2020-07-16 LAB — CBC
HCT: 41 % (ref 35.0–45.0)
Hemoglobin: 13.6 g/dL (ref 11.7–15.5)
MCH: 30.4 pg (ref 27.0–33.0)
MCHC: 33.2 g/dL (ref 32.0–36.0)
MCV: 91.5 fL (ref 80.0–100.0)
MPV: 11.4 fL (ref 7.5–12.5)
Platelets: 228 10*3/uL (ref 140–400)
RBC: 4.48 10*6/uL (ref 3.80–5.10)
RDW: 13.1 % (ref 11.0–15.0)
WBC: 10.2 10*3/uL (ref 3.8–10.8)

## 2020-07-16 LAB — COMPLETE METABOLIC PANEL WITH GFR
AG Ratio: 2.2 (calc) (ref 1.0–2.5)
ALT: 7 U/L (ref 6–29)
AST: 10 U/L (ref 10–35)
Albumin: 4.3 g/dL (ref 3.6–5.1)
Alkaline phosphatase (APISO): 84 U/L (ref 37–153)
BUN/Creatinine Ratio: 25 (calc) — ABNORMAL HIGH (ref 6–22)
BUN: 14 mg/dL (ref 7–25)
CO2: 29 mmol/L (ref 20–32)
Calcium: 9.3 mg/dL (ref 8.6–10.4)
Chloride: 103 mmol/L (ref 98–110)
Creat: 0.56 mg/dL — ABNORMAL LOW (ref 0.60–0.93)
GFR, Est African American: 104 mL/min/{1.73_m2} (ref >=60)
GFR, Est Non African American: 90 mL/min/{1.73_m2} (ref >=60)
Globulin: 2 g/dL (calc) (ref 1.9–3.7)
Glucose, Bld: 89 mg/dL (ref 65–99)
Potassium: 5 mmol/L (ref 3.5–5.3)
Sodium: 140 mmol/L (ref 135–146)
Total Bilirubin: 0.5 mg/dL (ref 0.2–1.2)
Total Protein: 6.3 g/dL (ref 6.1–8.1)

## 2020-07-16 LAB — LIPID PANEL
Cholesterol: 211 mg/dL — ABNORMAL HIGH (ref <200)
HDL: 45 mg/dL — ABNORMAL LOW (ref >=50)
LDL Cholesterol (Calc): 136 mg/dL (calc) — ABNORMAL HIGH
Non-HDL Cholesterol (Calc): 166 mg/dL (calc) — ABNORMAL HIGH (ref <130)
Total CHOL/HDL Ratio: 4.7 (calc) (ref <5.0)
Triglycerides: 164 mg/dL — ABNORMAL HIGH (ref <150)

## 2020-09-15 ENCOUNTER — Encounter: Payer: Self-pay | Admitting: Hospice and Palliative Medicine

## 2020-09-15 ENCOUNTER — Ambulatory Visit (INDEPENDENT_AMBULATORY_CARE_PROVIDER_SITE_OTHER): Payer: Medicare Other | Admitting: Hospice and Palliative Medicine

## 2020-09-15 ENCOUNTER — Other Ambulatory Visit: Payer: Self-pay

## 2020-09-15 VITALS — BP 150/84 | HR 95 | Temp 98.0°F | Resp 16 | Ht 64.0 in | Wt 169.2 lb

## 2020-09-15 DIAGNOSIS — M25511 Pain in right shoulder: Secondary | ICD-10-CM | POA: Diagnosis not present

## 2020-09-15 DIAGNOSIS — R413 Other amnesia: Secondary | ICD-10-CM | POA: Diagnosis not present

## 2020-09-15 DIAGNOSIS — J449 Chronic obstructive pulmonary disease, unspecified: Secondary | ICD-10-CM

## 2020-09-15 DIAGNOSIS — F17210 Nicotine dependence, cigarettes, uncomplicated: Secondary | ICD-10-CM

## 2020-09-15 DIAGNOSIS — G8929 Other chronic pain: Secondary | ICD-10-CM

## 2020-09-15 MED ORDER — FLUTICASONE FUROATE-VILANTEROL 200-25 MCG/INH IN AEPB: 1.0000 | INHALATION_SPRAY | Freq: Every day | RESPIRATORY_TRACT | 3 refills | Status: DC

## 2020-09-15 NOTE — Progress Notes (Signed)
[facility] [address] [place], [place] [address]  Internal MEDICINE  Office Visit Note  Patient Name: Jennifer Hayes  [id number]  [id number]  Date of Service: 09/19/2020  Chief Complaint  Patient presents with   Memory Loss    having problems remembering things   Shoulder Pain    wants to see a orthopedic   Fatigue    feels bad all the time and tired     HPI Pt is here for a sick visit. Daughter in law present during exam today Complaining today about right shoulder pain, pain is not new has been going on for a few months but is not getting better and would like referral to ortho Several other complaints today 1. Memory loss--has noticed a decline in her memory of the last year, still lives alone, has had several falls without significant injury, lack of appetite, eating one meal per day Daughter in law requesting home health services to come into her home to help with bathing and meal prepping 2. Shortness of breath--continues to smoke about a pack of cigarettes per day, has shortness of breath at rest and wheezing--uses Breo once or twice per week, uses nebulizer once or twice per month   Current Medication:  Outpatient Encounter Medications as of 09/15/2020  Medication Sig   atorvastatin (LIPITOR) 20 MG tablet Take 20 mg by mouth daily.   citalopram (CELEXA) 40 MG tablet Take 1 tablet (40 mg total) by mouth daily.   esomeprazole (NEXIUM) 40 MG capsule Take 1 capsule (40 mg total) by mouth daily.   fluticasone furoate-vilanterol (BREO ELLIPTA) 200-25 MCG/INH AEPB Inhale 1 puff into the lungs daily.   ibuprofen (ADVIL) 800 MG tablet TAKE ONE TABLET BY MOUTH THREE TIMES A DAY   Ipratropium-Albuterol (COMBIVENT) 20-100 MCG/ACT AERS respimat Inhale into the lungs.   ipratropium-albuterol (DUONEB) 0.5-2.5 (3) MG/3ML SOLN Take 3 mLs by nebulization 2 (two) times a day as needed.   oxycodone (OXY-IR) 5 MG capsule Take by mouth.     [DISCONTINUED] fluticasone furoate-vilanterol (BREO ELLIPTA) 200-25 MCG/INH AEPB Inhale 1 puff into the lungs daily.   No facility-administered encounter medications on file as of 09/15/2020.      Medical History: Past Medical History:  Diagnosis Date   Arthritis    CHF (congestive heart failure) (HCC)    COPD (chronic obstructive pulmonary disease) (HCC)    Depression    GERD (gastroesophageal reflux disease)    Stroke (HCC)      Vital Signs: BP (!) 150/84    Pulse 95    Temp 98 F (36.7 C)    Resp 16    Ht 5\' 4"  (1.626 m)    Wt 169 lb 3.2 oz (76.7 kg)    SpO2 95%    BMI 29.04 kg/m    Review of Systems  Constitutional: Positive for fatigue. Negative for chills and diaphoresis.  HENT: Negative for ear pain, postnasal drip and sinus pressure.   Eyes: Negative for photophobia, discharge, redness, itching and visual disturbance.  Respiratory: Positive for shortness of breath and wheezing. Negative for cough.   Cardiovascular: Negative for chest pain, palpitations and leg swelling.  Gastrointestinal: Negative for abdominal pain, constipation, diarrhea, nausea and vomiting.  Genitourinary: Negative for dysuria and flank pain.  Musculoskeletal: Positive for arthralgias and gait problem. Negative for back pain and neck pain.       Right shoulder pain  Skin: Negative for color change.  Allergic/Immunologic: Negative for environmental allergies and food allergies.  Neurological: Negative for dizziness and headaches.  Hematological: Does not bruise/bleed easily.  Psychiatric/Behavioral: Negative for agitation, behavioral problems (depression) and hallucinations.    Physical Exam Vitals reviewed.  Constitutional:      Appearance: Normal appearance. She is normal weight.  Cardiovascular:     Rate and Rhythm: Normal rate and regular rhythm.     Pulses: Normal pulses.     Heart sounds: Normal heart sounds.  Pulmonary:     Breath sounds: Examination of the right-middle field  reveals wheezing. Examination of the left-middle field reveals wheezing. Examination of the right-lower field reveals wheezing. Examination of the left-lower field reveals wheezing. Wheezing present.  Musculoskeletal:     Right shoulder: Decreased range of motion.     Cervical back: Normal range of motion.  Skin:    General: Skin is warm.  Neurological:     General: No focal deficit present.     Mental Status: She is alert and oriented to person, place, and time. Mental status is at baseline.  Psychiatric:        Mood and Affect: Mood normal.        Behavior: Behavior normal.        Thought Content: Thought content normal.        Judgment: Judgment normal.     Assessment/Plan: 1. Chronic right shoulder pain - Ambulatory referral to Orthopedic Surgery  2. Chronic obstructive pulmonary disease, unspecified COPD type (HCC) Advised to start using Breo daily for shortness of breath and wheezing, also needs to start using nebulizer therapy daily for wheezing - fluticasone furoate-vilanterol (BREO ELLIPTA) 200-25 MCG/INH AEPB; Inhale 1 puff into the lungs daily.  Dispense: 60 each; Refill: 3  3. Memory loss Review thyroid as well as B12 levels, may be contributing to memory loss and other symptoms, will adjust therapy as indicated Home health will be ordered Advised daughter in law to look into Life Alert system in case of future falls--will also look into companies that may provide assistance for such devices - TSH + free T4 - B12  4. Continuous dependence on cigarette smoking Evaluate for underlying malignancy due to prolonged use of cigarette smoking In depth discussions about smoking cessation Smoking cessation counseling: 1. Pt acknowledges the risks of long term smoking, she will try to quite smoking. 2. Options for different medications including nicotine products, chewing gum, patch etc, Wellbutrin and Chantix is discussed 3. Goal and date of compete cessation is  discussed 4. Total time spent in smoking cessation is 15 min. - CT CHEST LUNG CA SCREEN LOW DOSE W/O CM; Future  General Counseling: Rayla verbalizes understanding of the findings of todays visit and agrees with plan of treatment. I have discussed any further diagnostic evaluation that may be needed or ordered today. We also reviewed her medications today. she has been encouraged to call the office with any questions or concerns that should arise related to todays visit.   Orders Placed This Encounter  Procedures   CT CHEST LUNG CA SCREEN LOW DOSE W/O CM   TSH + free T4   B12   Ambulatory referral to Orthopedic Surgery    Meds ordered this encounter  Medications   fluticasone furoate-vilanterol (BREO ELLIPTA) 200-25 MCG/INH AEPB    Sig: Inhale 1 puff into the lungs daily.    Dispense:  60 each    Refill:  3    Please provide 90 day supply    Time spent: 30 Minutes Time spent includes review of chart, medications,  test results and follow-up plan with the patient.  This patient was seen by Leeanne Deed AGNP-C in Collaboration with Dr Lyndon Code as a part of collaborative care agreement.  Lubertha Basque [facility] Internal Medicine

## 2020-09-16 LAB — TSH+FREE T4
Free T4: 1.02 ng/dL (ref 0.82–1.77)
TSH: 1.02 u[IU]/mL (ref 0.450–4.500)

## 2020-09-16 LAB — VITAMIN B12: Vitamin B-12: 247 pg/mL (ref 232–1245)

## 2020-09-16 NOTE — Progress Notes (Signed)
Labs reviewed, will discuss at upcoming visit.

## 2020-09-19 ENCOUNTER — Encounter: Payer: Self-pay | Admitting: Hospice and Palliative Medicine

## 2020-09-19 IMAGING — CR DG THORACIC SPINE 2V
1 series · 4 of 4 positions shown · non-contrast
Comparison: Chest x-ray dated 06/06/2015

CLINICAL DATA: Chronic thoracic back pain.

EXAM:
THORACIC SPINE 2 VIEWS

[Series 1: dg thoracic spine 2 view · 0.14mm/px · 4 of 4 slices shown]
[im 1/4]
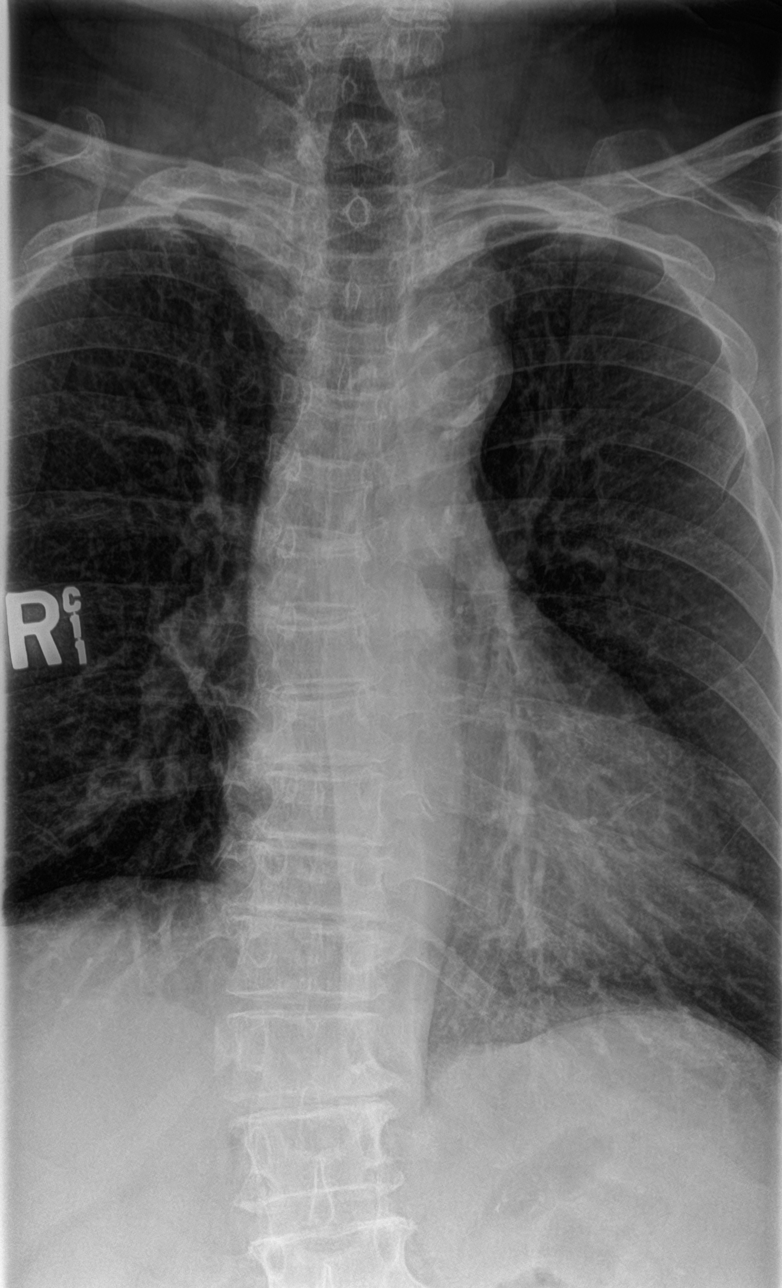
[im 2/4]
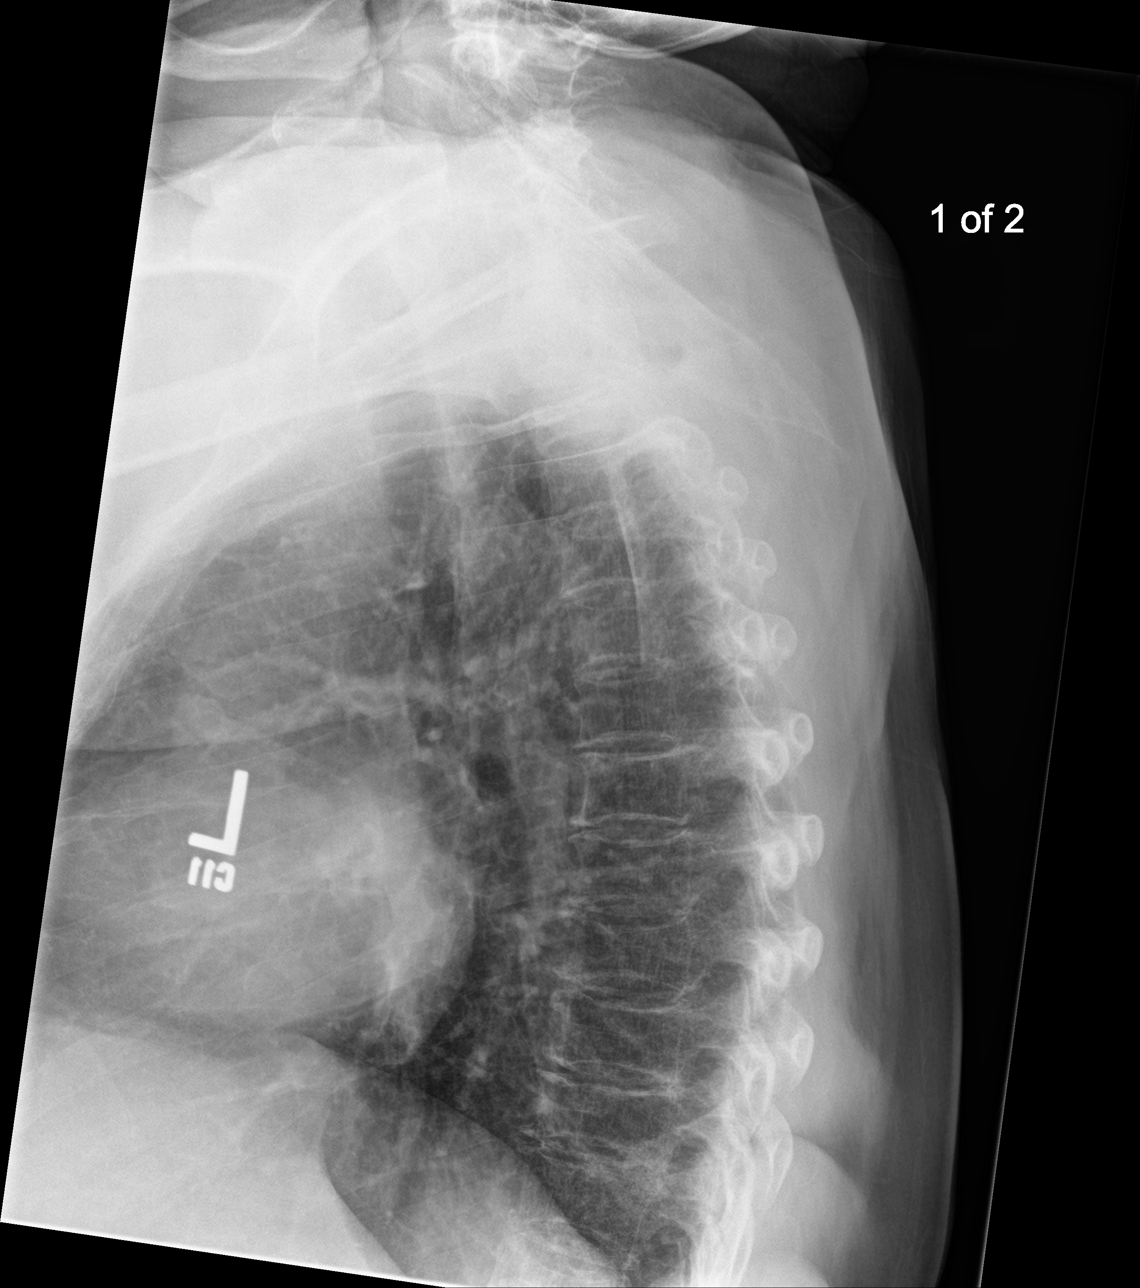
[im 3/4]
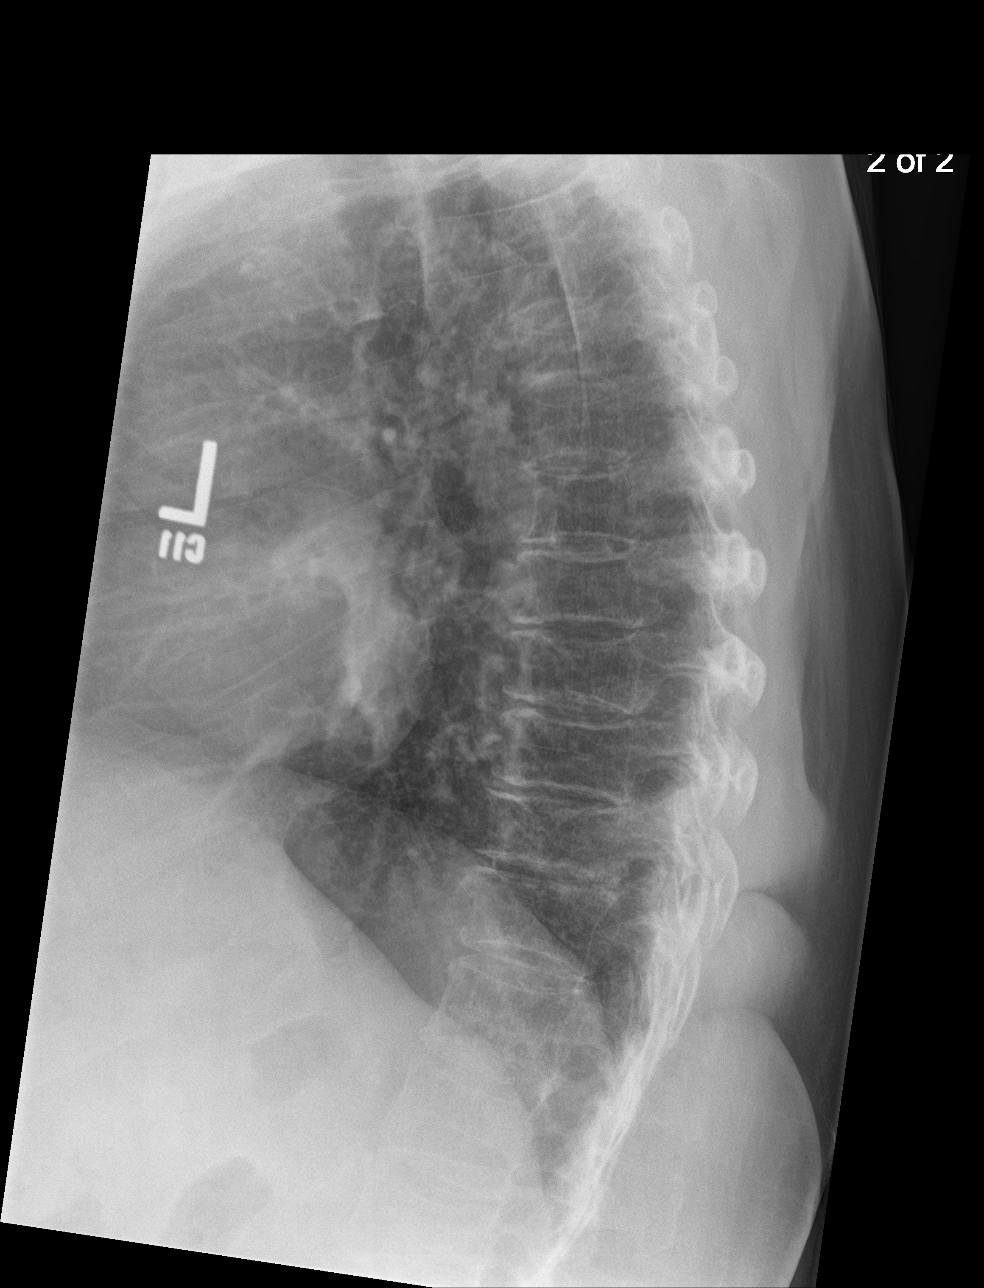
[im 4/4]
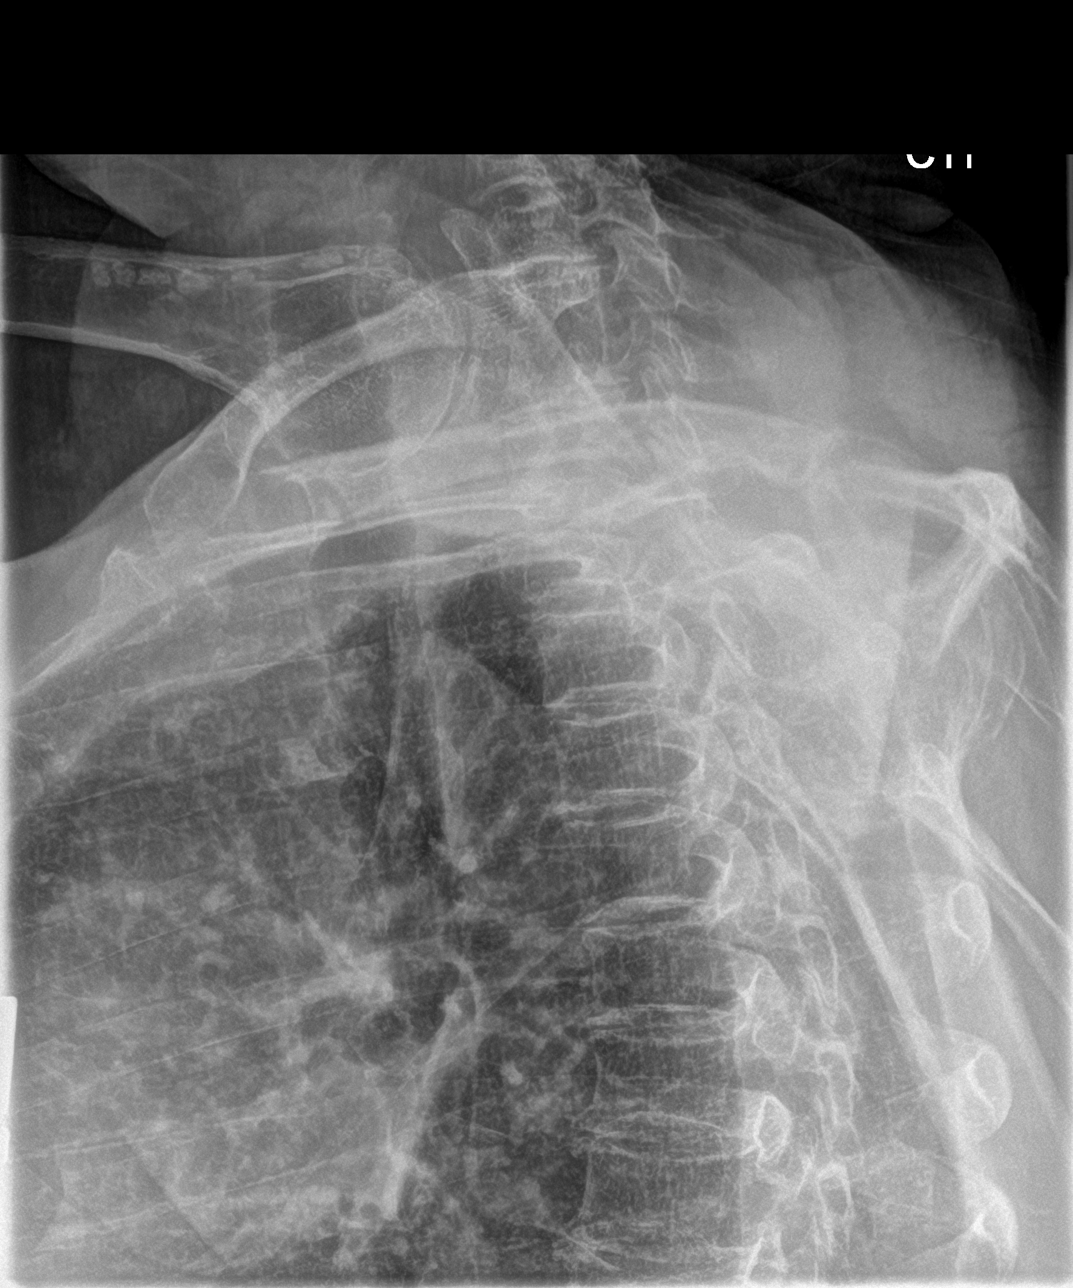

[4 of 4 positions shown; findings below may reference images not displayed]

FINDINGS: Slight compound thoracic scoliosis. Old mild compression deformity
of the superior aspect of T10. No acute abnormalities. No bone
destruction.

Aortic atherosclerosis.
IMPRESSION: 1. No acute abnormality.
2. Old mild compression deformity of T10.

## 2020-09-19 IMAGING — CR DG CERVICAL SPINE 2 OR 3 VIEWS
1 series · 3 of 3 positions shown · non-contrast
Comparison: CT scan of the cervical spine dated 07/13/2009

CLINICAL DATA: Right-sided neck pain and right shoulder pain for
6-7 months. Previous anterior fusion at C5-6.

EXAM:
CERVICAL SPINE - 2-3 VIEW

[Series 1: dg cervical spine 2 or 3 views · 0.14mm/px · 3 of 3 slices shown]
[im 1/3]
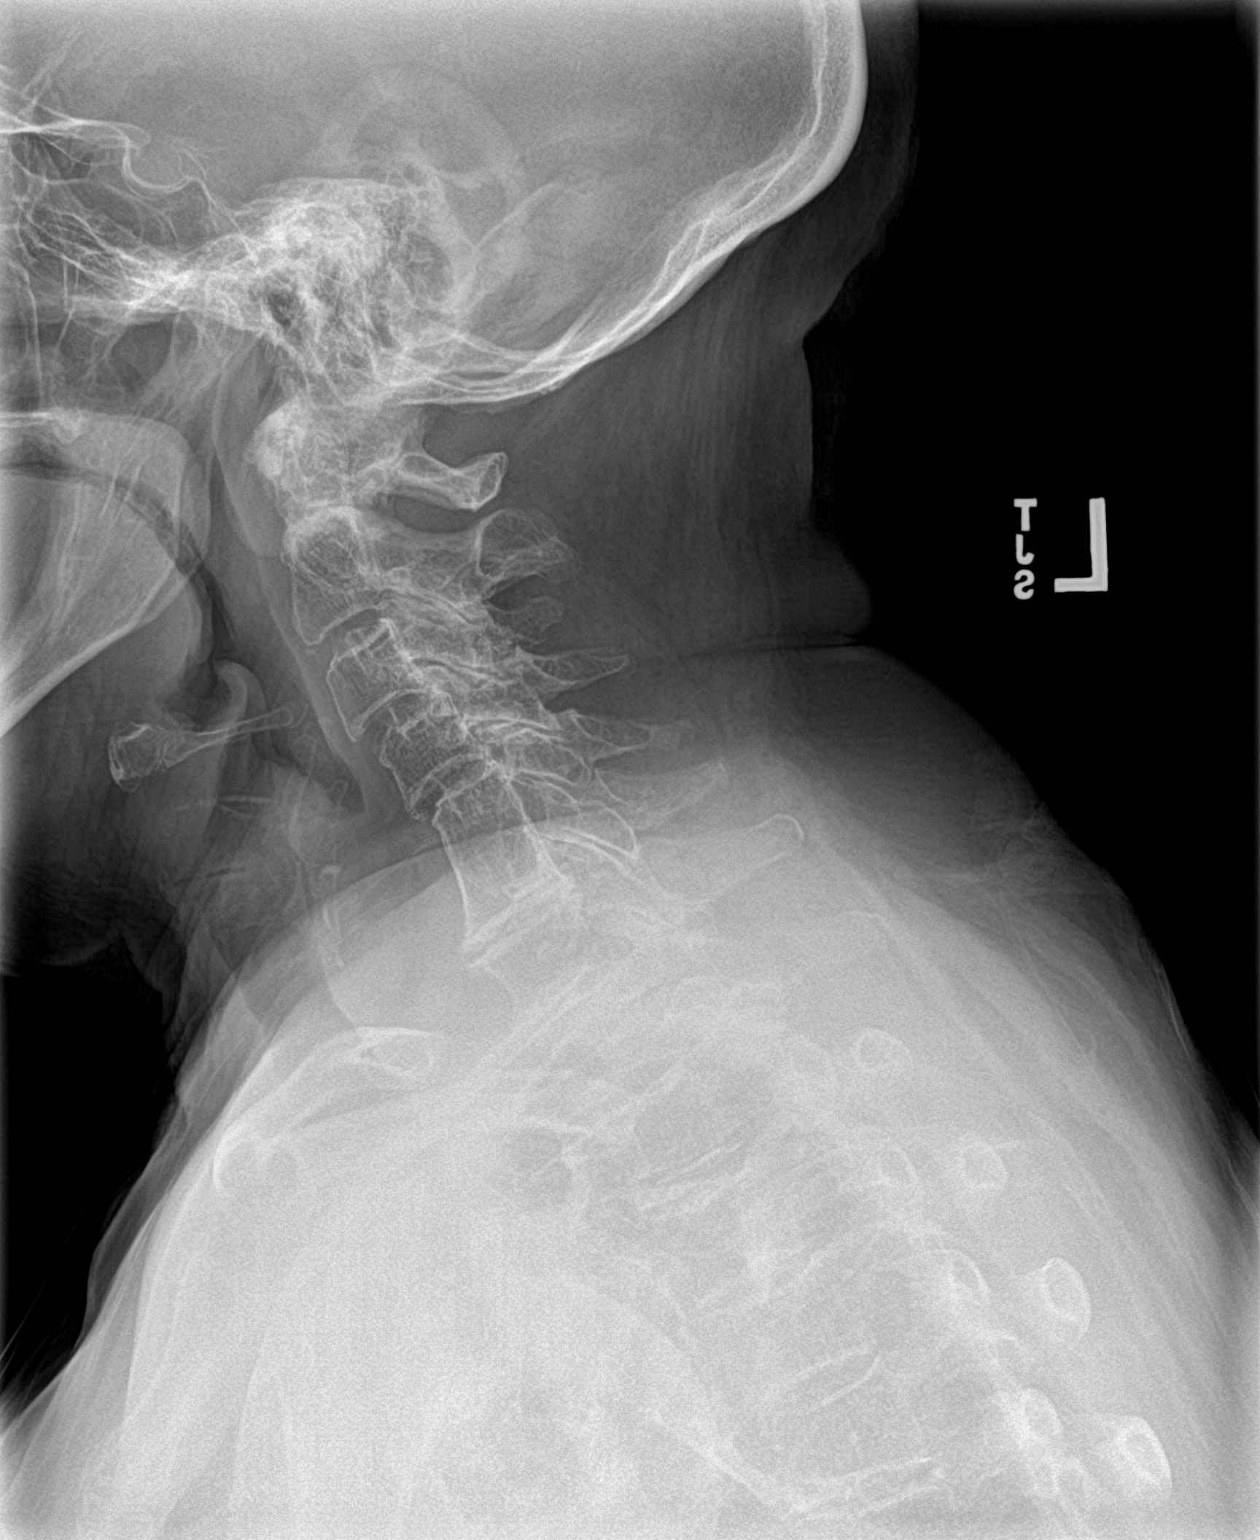
[im 2/3]
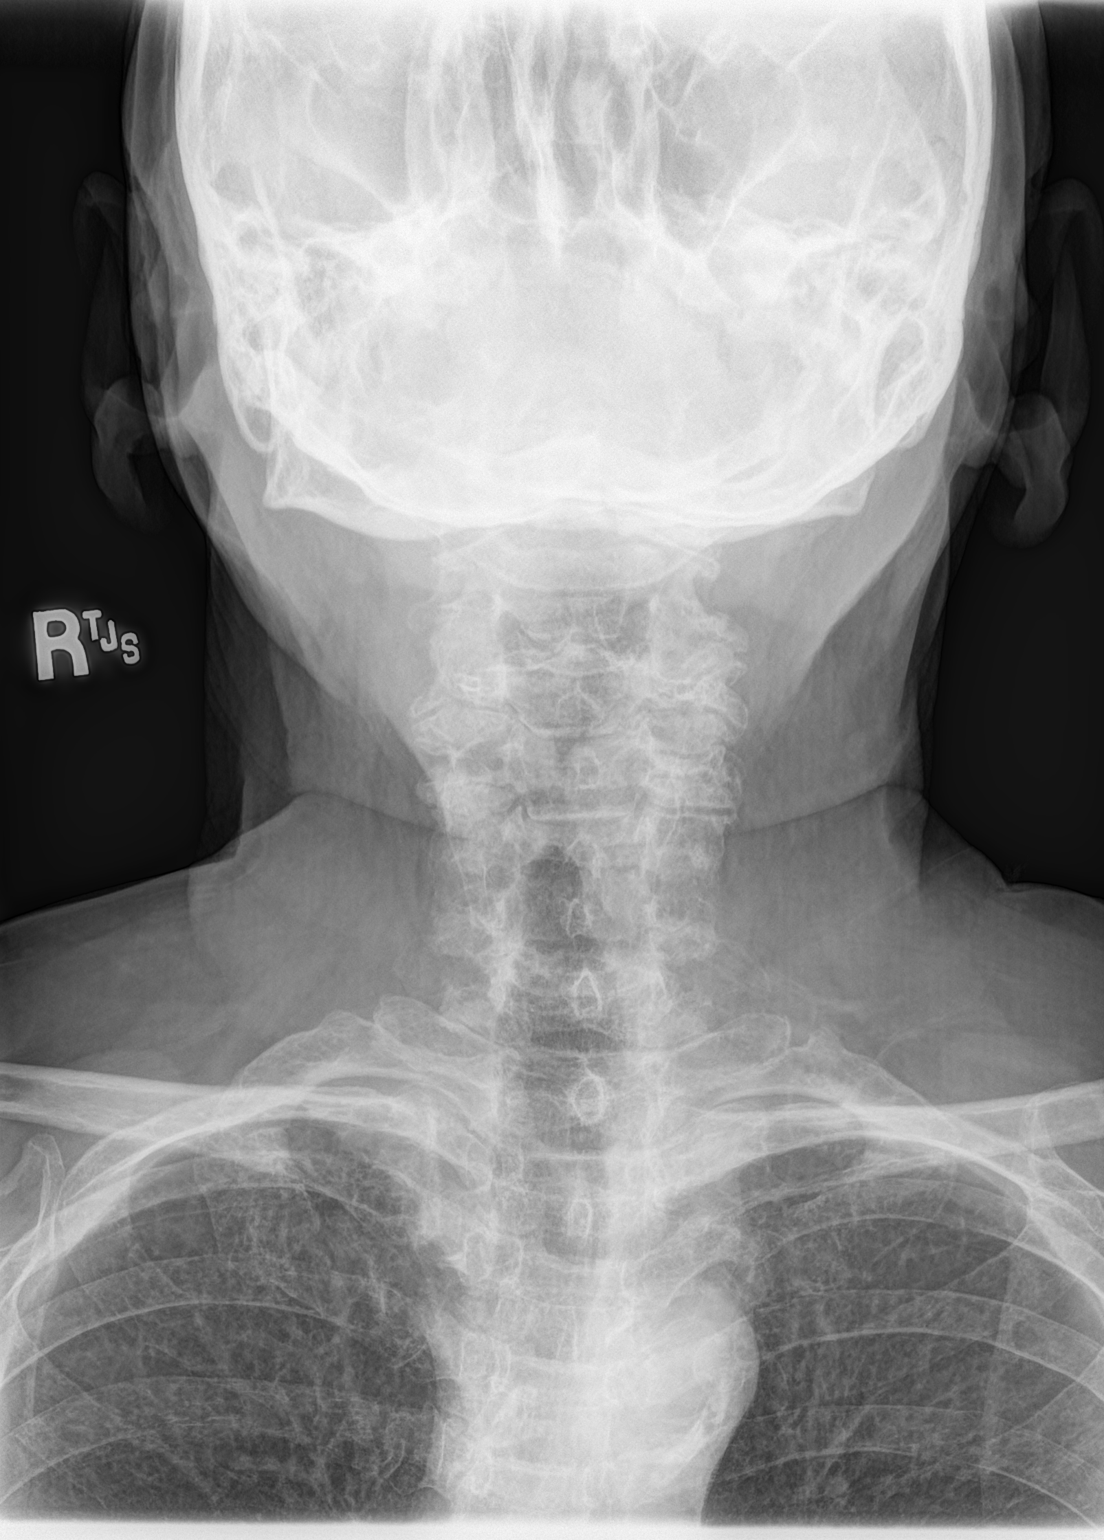
[im 3/3]
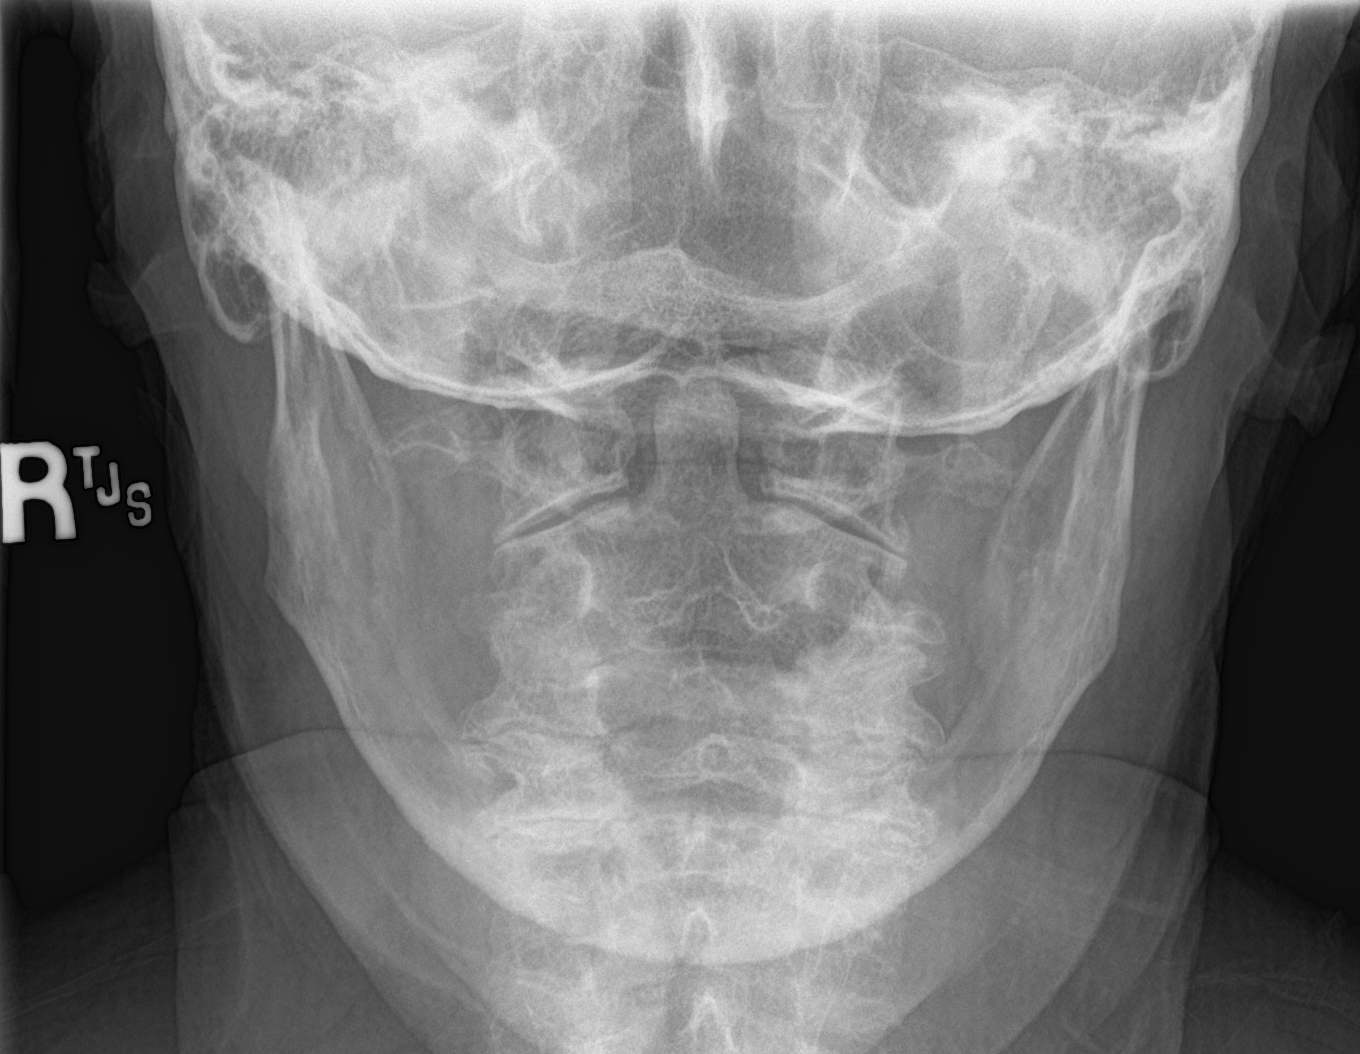

[3 of 3 positions shown; findings below may reference images not displayed]

FINDINGS: There is diffuse osteopenia. Minimal anterolisthesis of C3 on C4
which is chronic. Fairly severe facet arthritis at C2-3, C3-4, and
C4-5.

Chronic disc space narrowing at C6-7. Solid anterior fusion at C5-6.
Probable autofusion of the right lateral masses at C2-3.
IMPRESSION: 1. No acute abnormality.
2. Chronic degenerative changes of the cervical spine.
3. Solid anterior fusion at C5-6.
4. Probable autofusion of the right lateral masses at C2-3.

## 2020-09-19 IMAGING — CR DG SHOULDER 2+V*L*
1 series · 4 of 4 positions shown · non-contrast
Comparison: None.

CLINICAL DATA: Chronic left shoulder pain.

EXAM:
LEFT SHOULDER - 2+ VIEW

[Series 1: dg shoulder left · 0.14mm/px · 4 of 4 slices shown]
[im 1/4]
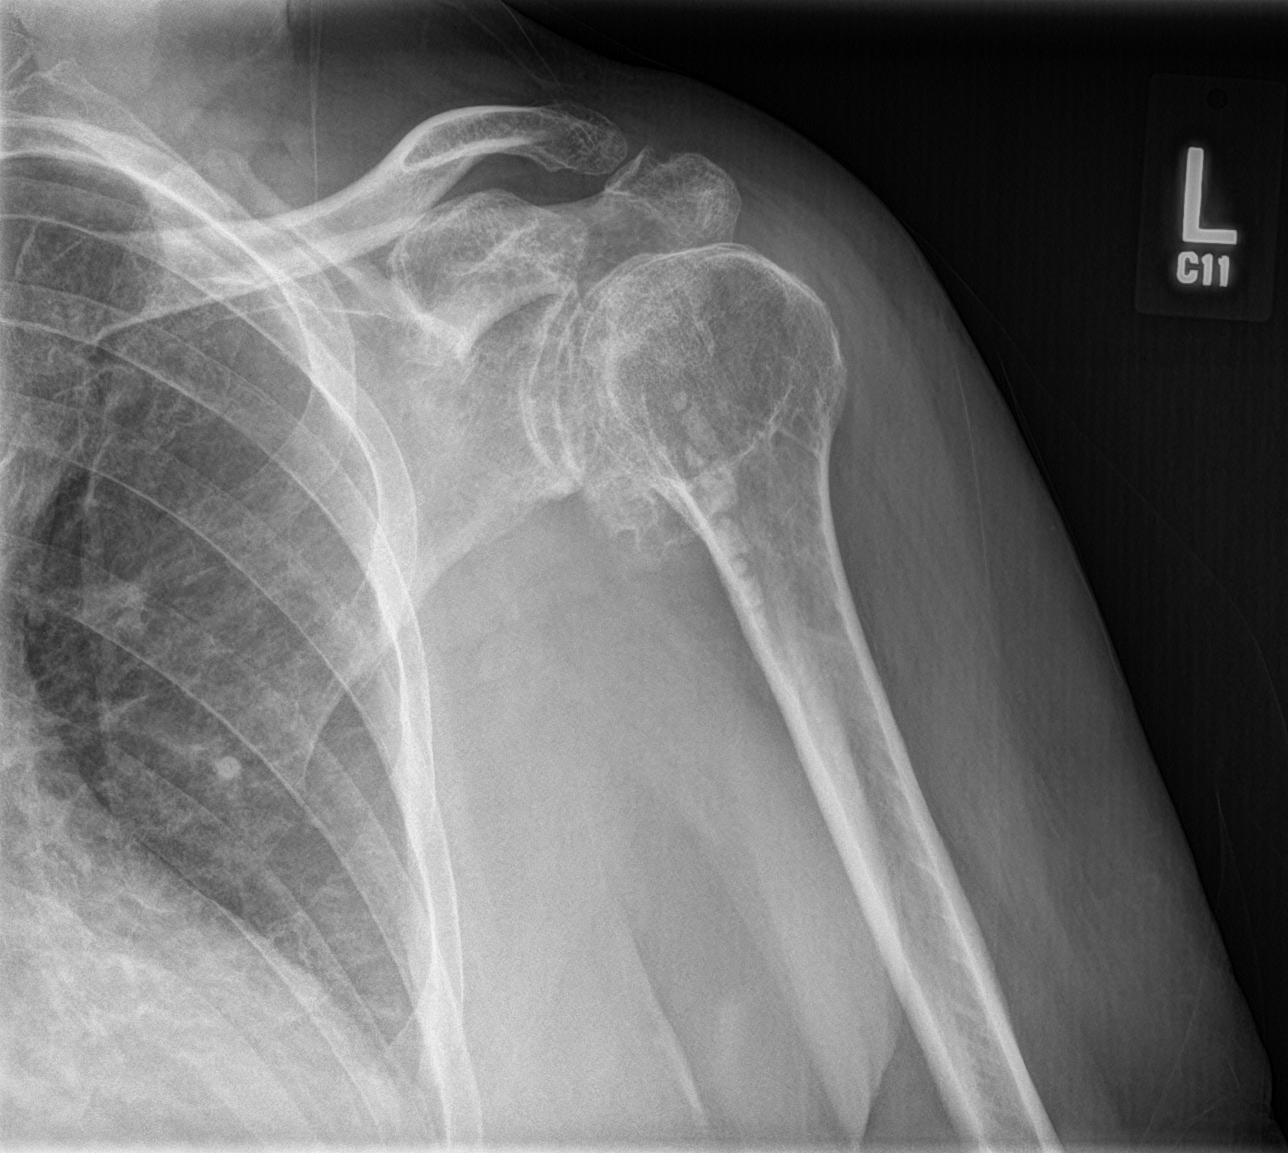
[im 2/4]
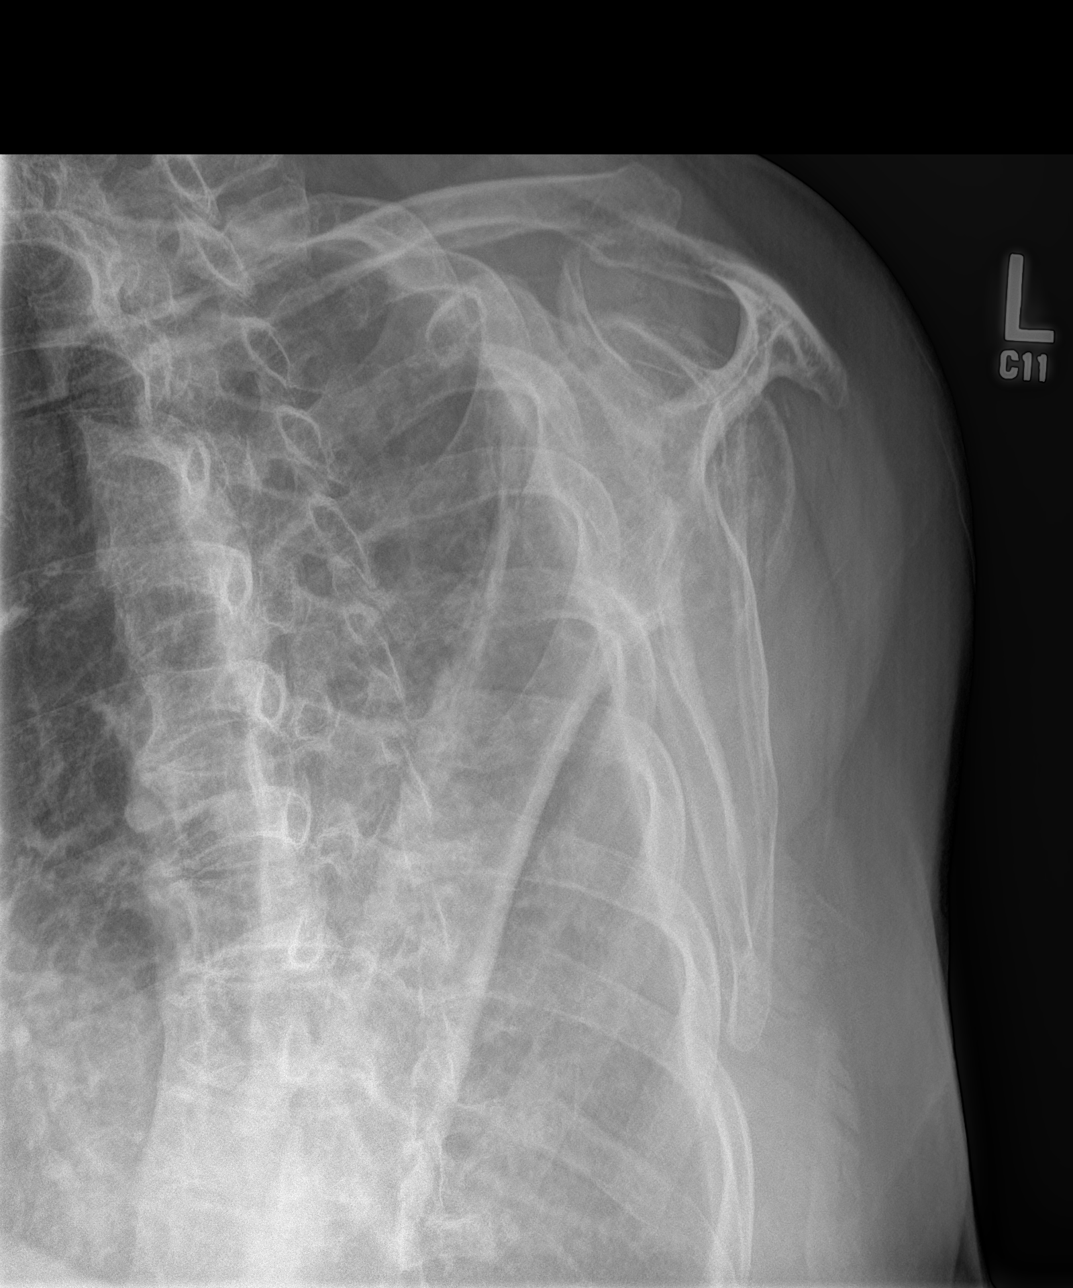
[im 3/4]
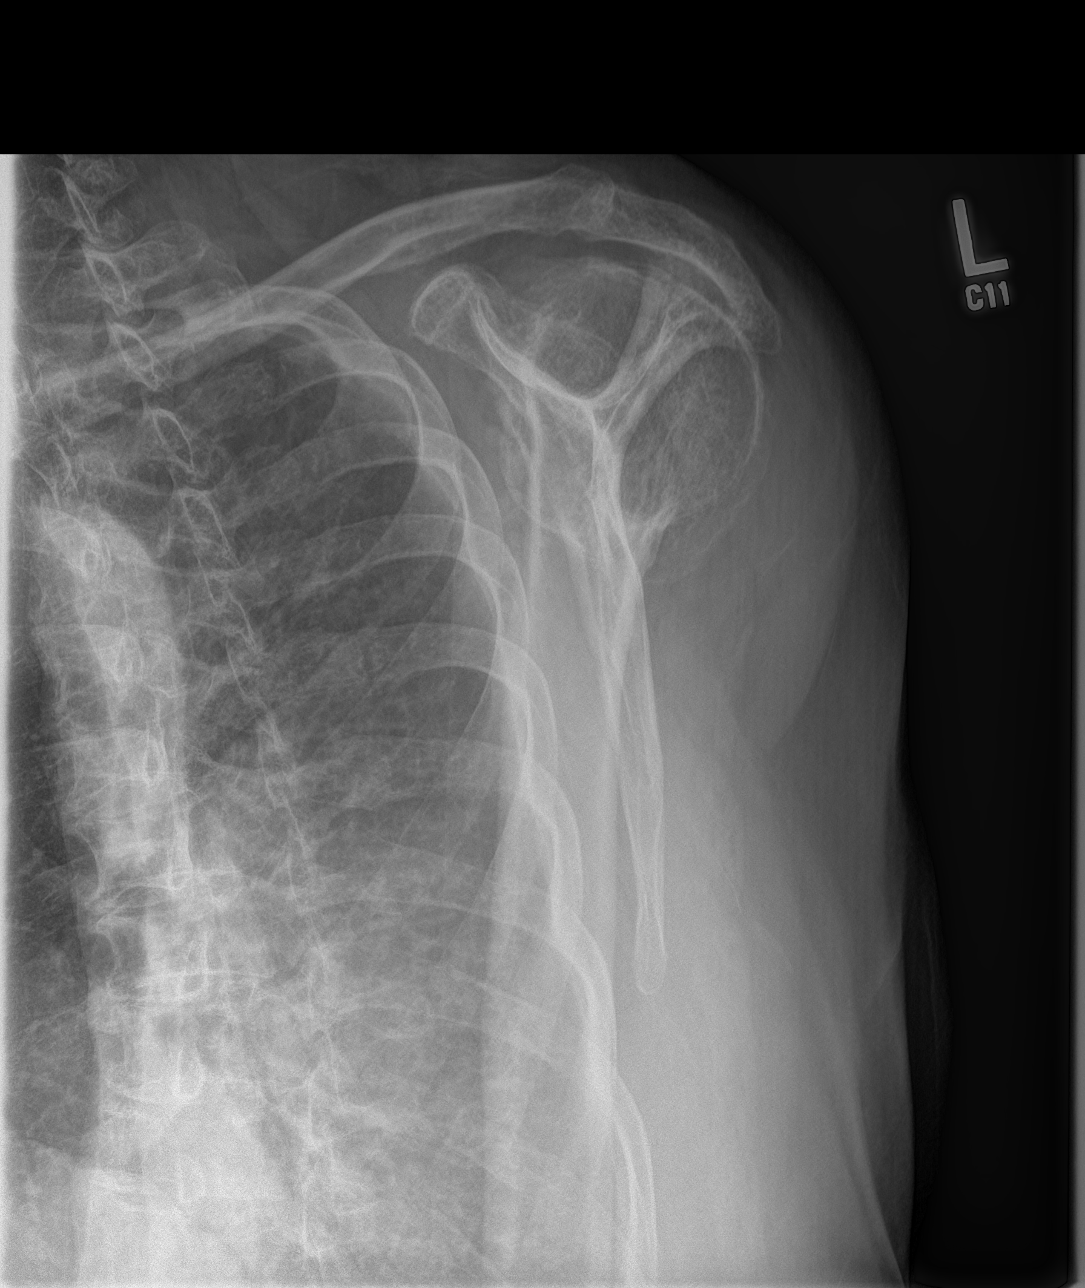
[im 4/4]
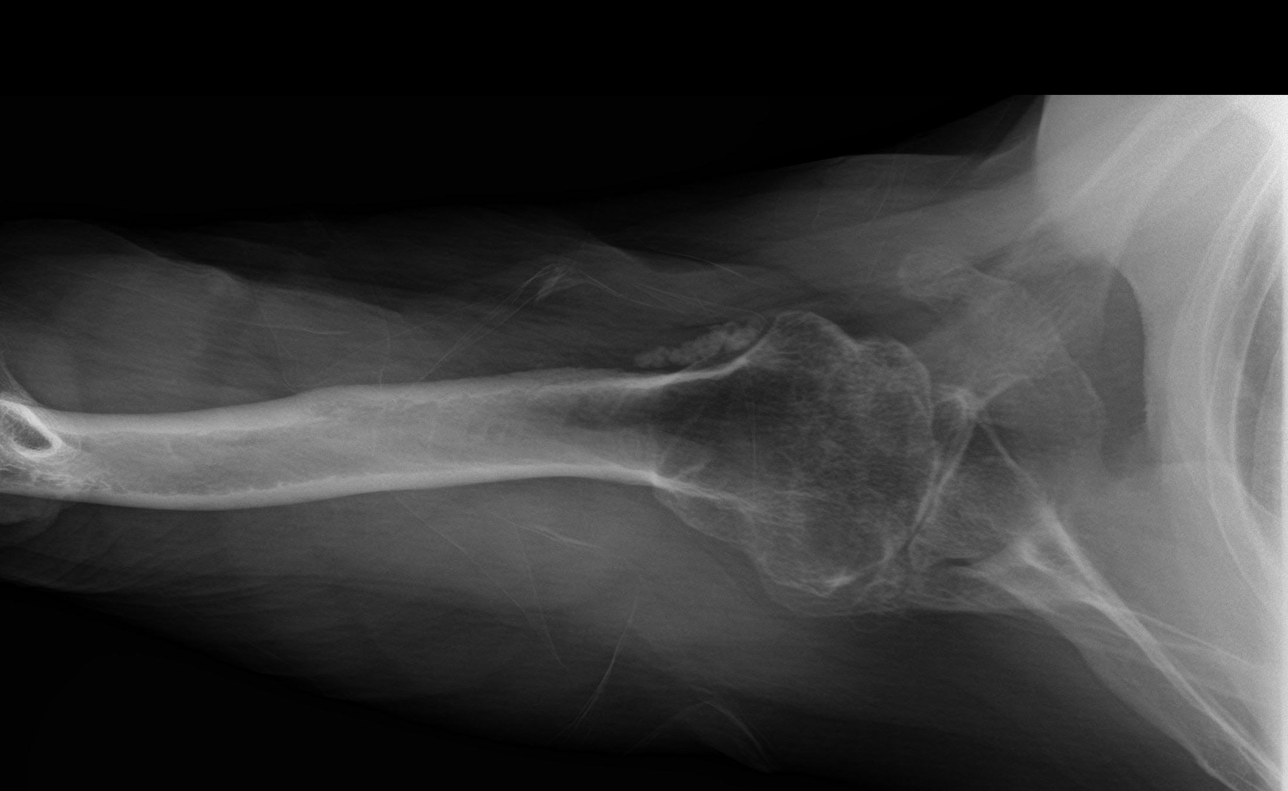

[4 of 4 positions shown; findings below may reference images not displayed]

FINDINGS: The patient has severe osteoarthritis of the glenohumeral joint with
prominent marginal osteophytes on the humeral head and marked joint
space narrowing and slight erosion of the posterior aspect of the
glenoid. Multiple loose bodies are seen in the bicipital tendon
sheath. AC joint appears normal.

No fracture or dislocation.
IMPRESSION: Severe osteoarthritis of the left glenohumeral joint. Multiple loose
bodies in the bicipital tendon sheath.

## 2020-09-20 ENCOUNTER — Other Ambulatory Visit: Payer: Self-pay

## 2020-09-20 ENCOUNTER — Telehealth: Payer: Self-pay

## 2020-09-20 DIAGNOSIS — J449 Chronic obstructive pulmonary disease, unspecified: Secondary | ICD-10-CM

## 2020-09-20 DIAGNOSIS — F17210 Nicotine dependence, cigarettes, uncomplicated: Secondary | ICD-10-CM

## 2020-09-20 NOTE — Telephone Encounter (Signed)
Left message advising pt of ct appt. Jennifer Hayes 

## 2020-09-23 ENCOUNTER — Ambulatory Visit: Payer: Medicare Other | Attending: Hospice and Palliative Medicine

## 2020-09-29 ENCOUNTER — Ambulatory Visit: Admitting: Hospice and Palliative Medicine

## 2020-09-30 ENCOUNTER — Other Ambulatory Visit: Payer: Self-pay

## 2020-09-30 ENCOUNTER — Telehealth: Payer: Self-pay

## 2020-09-30 NOTE — Telephone Encounter (Signed)
Faxed life alert to sarah at [facility]

## 2020-10-05 ENCOUNTER — Telehealth: Payer: Self-pay

## 2020-10-05 NOTE — Telephone Encounter (Signed)
-----   Message from Golda Acre sent at 09/29/2020  4:37 PM EST ----- We have do paper for life alert

## 2020-11-17 ENCOUNTER — Other Ambulatory Visit: Payer: Self-pay

## 2020-12-20 ENCOUNTER — Telehealth: Payer: Self-pay

## 2020-12-20 ENCOUNTER — Ambulatory Visit: Admitting: Hospice and Palliative Medicine

## 2020-12-20 NOTE — Telephone Encounter (Signed)
Pt advised to keep 12-21-20 ov or there will be a possibility cannot be seen at [facility] in future. Toni Amend

## 2020-12-21 ENCOUNTER — Ambulatory Visit (INDEPENDENT_AMBULATORY_CARE_PROVIDER_SITE_OTHER): Payer: Medicare Other | Admitting: Hospice and Palliative Medicine

## 2020-12-21 ENCOUNTER — Encounter: Payer: Self-pay | Admitting: Hospice and Palliative Medicine

## 2020-12-21 ENCOUNTER — Other Ambulatory Visit: Payer: Self-pay

## 2020-12-21 VITALS — BP 138/82 | HR 90 | Temp 97.2°F | Resp 16 | Ht 62.0 in | Wt 171.6 lb

## 2020-12-21 DIAGNOSIS — K219 Gastro-esophageal reflux disease without esophagitis: Secondary | ICD-10-CM | POA: Diagnosis not present

## 2020-12-21 DIAGNOSIS — R0600 Dyspnea, unspecified: Secondary | ICD-10-CM | POA: Diagnosis not present

## 2020-12-21 DIAGNOSIS — I10 Essential (primary) hypertension: Secondary | ICD-10-CM | POA: Diagnosis not present

## 2020-12-21 DIAGNOSIS — F5101 Primary insomnia: Secondary | ICD-10-CM | POA: Diagnosis not present

## 2020-12-21 DIAGNOSIS — R0609 Other forms of dyspnea: Secondary | ICD-10-CM

## 2020-12-21 MED ORDER — TRAZODONE HCL 50 MG PO TABS: 25.0000 mg | ORAL_TABLET | Freq: Every evening | ORAL | 0 refills | Status: DC | PRN

## 2020-12-21 MED ORDER — ESOMEPRAZOLE MAGNESIUM 40 MG PO CPDR: 40.0000 mg | DELAYED_RELEASE_CAPSULE | Freq: Every day | ORAL | 2 refills | Status: DC

## 2020-12-21 NOTE — Progress Notes (Signed)
[facility] [address] [place], [place] [address]  Internal MEDICINE  Office Visit Note  Patient Name: Jennifer Hayes  [id number]  [id number]  Date of Service: 12/22/2020  Chief Complaint  Patient presents with  . Acute Visit    Not feeling well, refill request  . Fatigue    Low energy     HPI Patient being seen today for acute sick visit C/o ongoing fatigue and little energy--difficulty sleeping--averaging 5-6 hours per night Seen in December for similar symptoms, did not follow through with testing ordered at that visit and missed several follow-up appointments since Has difficulty getting to and from her appointments Lacks support from family  Discussed low energy and fatigue is multi-factorial and will require appropriate testing and follow-up  Continues to smoke about a pack of cigarettes per day--cancelled low dose CT scan  Current Medication: Outpatient Encounter Medications as of 12/21/2020  Medication Sig  . traZODone (DESYREL) 50 MG tablet Take 0.5-1 tablets (25-50 mg total) by mouth at bedtime as needed for sleep.  Marland Kitchen atorvastatin (LIPITOR) 20 MG tablet Take 20 mg by mouth daily.  . citalopram (CELEXA) 40 MG tablet Take 1 tablet (40 mg total) by mouth daily.  Marland Kitchen esomeprazole (NEXIUM) 40 MG capsule Take 1 capsule (40 mg total) by mouth daily.  . fluticasone furoate-vilanterol (BREO ELLIPTA) 200-25 MCG/INH AEPB Inhale 1 puff into the lungs daily.  Marland Kitchen ibuprofen (ADVIL) 800 MG tablet TAKE ONE TABLET BY MOUTH THREE TIMES A DAY  . Ipratropium-Albuterol (COMBIVENT) 20-100 MCG/ACT AERS respimat Inhale into the lungs.  Marland Kitchen ipratropium-albuterol (DUONEB) 0.5-2.5 (3) MG/3ML SOLN Take 3 mLs by nebulization 2 (two) times a day as needed.  Marland Kitchen oxycodone (OXY-IR) 5 MG capsule Take by mouth.   . [DISCONTINUED] esomeprazole (NEXIUM) 40 MG capsule Take 1 capsule (40 mg total) by mouth daily.   No facility-administered encounter medications on file as of 12/21/2020.     Surgical History: Past Surgical History:  Procedure Laterality Date  . APPENDECTOMY    . BACK SURGERY      Medical History: Past Medical History:  Diagnosis Date  . Arthritis   . CHF (congestive heart failure) (HCC)   . COPD (chronic obstructive pulmonary disease) (HCC)   . Depression   . GERD (gastroesophageal reflux disease)   . Stroke [facility])     Family History: Family History  Family history unknown: Yes    Social History   Socioeconomic History  . Marital status: Widowed    Spouse name: Not on file  . Number of children: Not on file  . Years of education: Not on file  . Highest education level: Not on file  Occupational History  . Not on file  Tobacco Use  . Smoking status: Current Every Day Smoker    Packs/day: 0.50  . Smokeless tobacco: Never Used  Vaping Use  . Vaping Use: Some days  Substance and Sexual Activity  . Alcohol use: Not Currently    Comment: occasionally  . Drug use: Never  . Sexual activity: Yes  Other Topics Concern  . Not on file  Social History Narrative   She lives alone at home and has a cat that she adores.    Social Determinants of Health   Financial Resource Strain: Not on file  Food Insecurity: Not on file  Transportation Needs: Not on file  Physical Activity: Not on file  Stress: Not on file  Social Connections: Not on file  Intimate Partner Violence: Not on  file      Review of Systems  Constitutional: Positive for fatigue. Negative for chills and diaphoresis.  HENT: Negative for ear pain, postnasal drip and sinus pressure.   Eyes: Negative for photophobia, discharge, redness, itching and visual disturbance.  Respiratory: Positive for shortness of breath. Negative for cough and wheezing.   Cardiovascular: Positive for leg swelling. Negative for chest pain and palpitations.  Gastrointestinal: Negative for abdominal pain, constipation, diarrhea, nausea and vomiting.  Genitourinary: Negative for dysuria and flank  pain.  Musculoskeletal: Negative for arthralgias, back pain, gait problem and neck pain.  Skin: Negative for color change.  Allergic/Immunologic: Negative for environmental allergies and food allergies.  Neurological: Negative for dizziness and headaches.  Hematological: Does not bruise/bleed easily.  Psychiatric/Behavioral: Negative for agitation, behavioral problems (depression) and hallucinations.    Vital Signs: BP 138/82   Pulse 90   Temp (!) 97.2 F (36.2 C)   Resp 16   Ht 5\' 2"  (1.575 m)   Wt 171 lb 9.6 oz (77.8 kg)   SpO2 97%   BMI 31.39 kg/m    Physical Exam Vitals reviewed.  Constitutional:      Appearance: Normal appearance. She is normal weight.  Cardiovascular:     Rate and Rhythm: Normal rate and regular rhythm.     Pulses: Normal pulses.     Heart sounds: Normal heart sounds.  Pulmonary:     Effort: Pulmonary effort is normal.     Breath sounds: Normal breath sounds.  Abdominal:     General: Abdomen is flat.     Palpations: Abdomen is soft.  Musculoskeletal:        General: Normal range of motion.     Cervical back: Normal range of motion.  Skin:    General: Skin is warm.  Neurological:     General: No focal deficit present.     Mental Status: She is alert and oriented to person, place, and time. Mental status is at baseline.  Psychiatric:        Mood and Affect: Mood normal.        Behavior: Behavior normal.        Thought Content: Thought content normal.        Judgment: Judgment normal.   Assessment/Plan: 1. Dyspnea on exertion Review results of echocardiogram and adjust plan of care as indicated - ECHOCARDIOGRAM COMPLETE; Future  2. Essential hypertension BP and HR well controlled, continue to monitor  3. Primary insomnia Trial trazodone for insomnia, advised to try 1/2 tablet for first 3-4 nights and may increase to whole tablet if symptoms remain uncontrolled - traZODone (DESYREL) 50 MG tablet; Take 0.5-1 tablets (25-50 mg total) by  mouth at bedtime as needed for sleep.  Dispense: 90 tablet; Refill: 0  4. Gastroesophageal reflux disease without esophagitis Symptoms remain well controlled, requesting refills - esomeprazole (NEXIUM) 40 MG capsule; Take 1 capsule (40 mg total) by mouth daily.  Dispense: 90 capsule; Refill: 2  General Counseling: Briea verbalizes understanding of the findings of todays visit and agrees with plan of treatment. I have discussed any further diagnostic evaluation that may be needed or ordered today. We also reviewed her medications today. she has been encouraged to call the office with any questions or concerns that should arise related to todays visit.    Orders Placed This Encounter  Procedures  . ECHOCARDIOGRAM COMPLETE    Meds ordered this encounter  Medications  . traZODone (DESYREL) 50 MG tablet    Sig: Take 0.5-1  tablets (25-50 mg total) by mouth at bedtime as needed for sleep.    Dispense:  90 tablet    Refill:  0  . esomeprazole (NEXIUM) 40 MG capsule    Sig: Take 1 capsule (40 mg total) by mouth daily.    Dispense:  90 capsule    Refill:  2    Time spent: 30 Minutes Time spent includes review of chart, medications, test results and follow-up plan with the patient.  This patient was seen by Leeanne Deed AGNP-C in Collaboration with Dr Lyndon Code as a part of collaborative care agreement     Lubertha Basque. Helen Cuff AGNP-C Internal medicine

## 2020-12-22 ENCOUNTER — Encounter: Payer: Self-pay | Admitting: Hospice and Palliative Medicine

## 2021-01-04 ENCOUNTER — Telehealth: Payer: Self-pay

## 2021-01-04 NOTE — Telephone Encounter (Signed)
Pt called for ibuprofen refills as per taylor advised her take Tylenol  And d/c ibuprofen

## 2021-01-19 ENCOUNTER — Other Ambulatory Visit

## 2021-01-20 ENCOUNTER — Ambulatory Visit: Admitting: Hospice and Palliative Medicine

## 2021-01-26 ENCOUNTER — Ambulatory Visit: Admitting: Hospice and Palliative Medicine

## 2021-02-17 IMAGING — CR DG CHEST 2V
1 series · 2 of 2 positions shown · non-contrast
Comparison: 06/06/2015 and earlier

CLINICAL DATA: 79-year-old former smoker with acute onset of
shortness of breath that began yesterday without associated chest
pain. Tachypnea. Hypoxia.

EXAM:
CHEST - 2 VIEW

[Series 1: dg chest 2 view · 0.14mm/px · 2 of 2 slices shown]
[im 1/2]
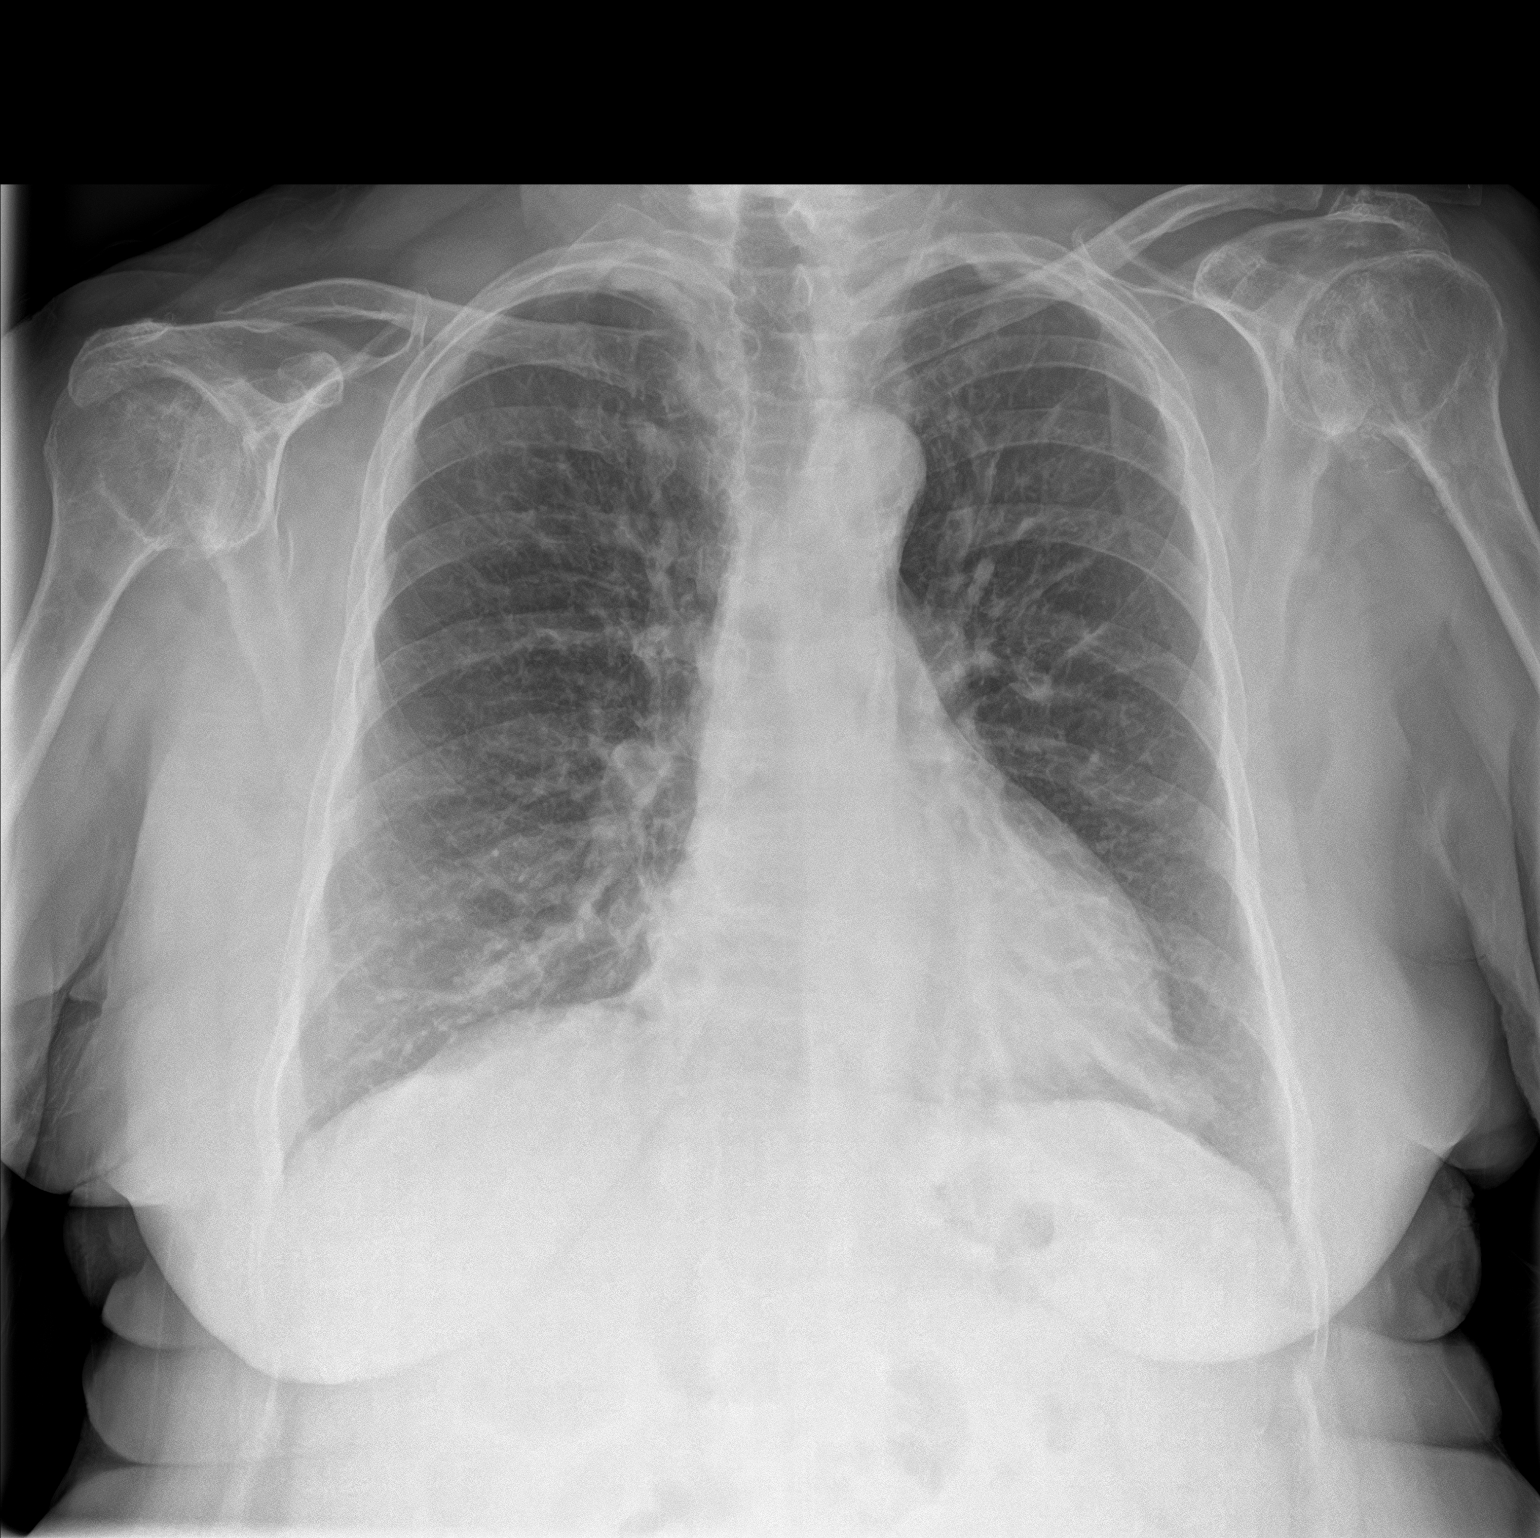
[im 2/2]
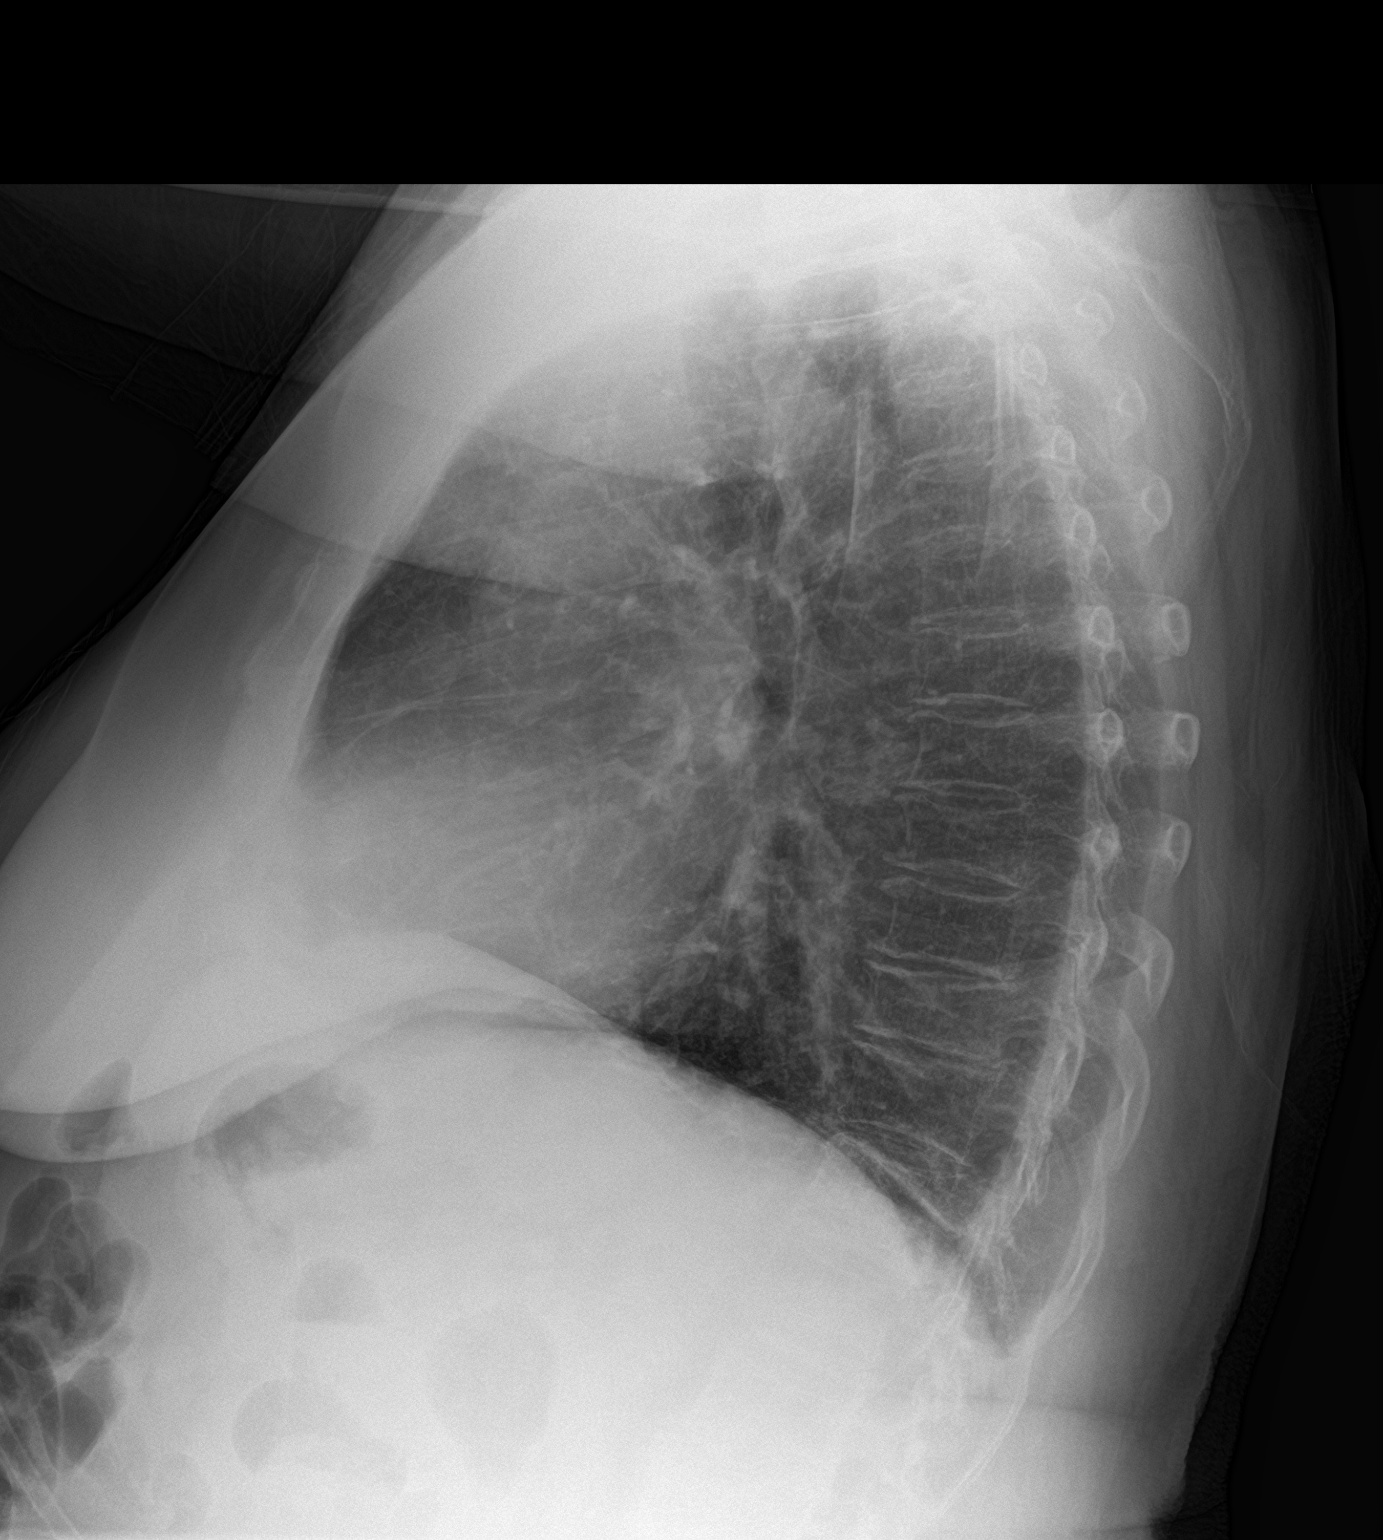

[2 of 2 positions shown; findings below may reference images not displayed]

FINDINGS: Cardiac silhouette normal in size, unchanged. Thoracic aorta
atherosclerotic, unchanged. Hilar and mediastinal contours otherwise
unremarkable. Mildly prominent bronchovascular markings diffusely
and mild to moderate central peribronchial thickening, slightly more
so than on the prior examination in 9623. Lungs otherwise clear. No
localized airspace consolidation. No pleural effusions. No
pneumothorax. Normal pulmonary vascularity. Degenerative changes
involving the thoracic spine and both shoulders.
IMPRESSION: Mild-to-moderate changes of acute bronchitis and/or asthma without
focal airspace pneumonia.

## 2021-02-23 ENCOUNTER — Ambulatory Visit: Admitting: Nurse Practitioner

## 2021-03-02 ENCOUNTER — Other Ambulatory Visit: Payer: Self-pay

## 2021-03-02 ENCOUNTER — Ambulatory Visit: Payer: Medicare Other

## 2021-03-02 DIAGNOSIS — R0609 Other forms of dyspnea: Secondary | ICD-10-CM

## 2021-03-02 DIAGNOSIS — R0602 Shortness of breath: Secondary | ICD-10-CM | POA: Diagnosis not present

## 2021-03-02 DIAGNOSIS — R0600 Dyspnea, unspecified: Secondary | ICD-10-CM

## 2021-03-15 ENCOUNTER — Other Ambulatory Visit: Payer: Self-pay | Admitting: Internal Medicine

## 2021-03-16 ENCOUNTER — Encounter: Payer: Self-pay | Admitting: Nurse Practitioner

## 2021-03-16 ENCOUNTER — Other Ambulatory Visit: Payer: Self-pay

## 2021-03-16 ENCOUNTER — Ambulatory Visit (INDEPENDENT_AMBULATORY_CARE_PROVIDER_SITE_OTHER): Payer: Medicare Other | Admitting: Nurse Practitioner

## 2021-03-16 VITALS — BP 140/80 | HR 76 | Temp 98.2°F | Resp 16 | Ht 62.0 in | Wt 171.2 lb

## 2021-03-16 DIAGNOSIS — K219 Gastro-esophageal reflux disease without esophagitis: Secondary | ICD-10-CM

## 2021-03-16 DIAGNOSIS — I10 Essential (primary) hypertension: Secondary | ICD-10-CM

## 2021-03-16 DIAGNOSIS — J449 Chronic obstructive pulmonary disease, unspecified: Secondary | ICD-10-CM

## 2021-03-16 DIAGNOSIS — F339 Major depressive disorder, recurrent, unspecified: Secondary | ICD-10-CM | POA: Diagnosis not present

## 2021-03-16 DIAGNOSIS — E782 Mixed hyperlipidemia: Secondary | ICD-10-CM

## 2021-03-16 MED ORDER — CITALOPRAM HYDROBROMIDE 40 MG PO TABS: 40.0000 mg | ORAL_TABLET | Freq: Every day | ORAL | 3 refills | Status: DC

## 2021-03-16 MED ORDER — ATORVASTATIN CALCIUM 40 MG PO TABS: 40.0000 mg | ORAL_TABLET | Freq: Every day | ORAL | 1 refills | Status: DC

## 2021-03-16 NOTE — Progress Notes (Signed)
[facility] [address] [place], [place] [address]  Internal MEDICINE  Office Visit Note  Patient Name: Jennifer Hayes  [id number]  [id number]  Date of Service: 03/20/2021  Chief Complaint  Patient presents with   Follow-up    US results, med refill citalopram    HPI Jennifer ForestShirley presents for a follow-up visit to discuss her echocardiogram results.  The echocardiogram results show a left ventricular ejection fraction 63.19% and mild dilation of the right atrium.  There is also diastolic filling with impaired relaxation of the left ventricle.  The rest of the echocardiogram is otherwise normal can repeat in 3 years to monitor.  Patient has a history of COPD, she has been using the Standard PacificBreo Ellipta inhaler.  Discussed the Trelegy Ellipta inhaler with the patient and she is interested in trialing it.  Samples were sent home with the patient today. - The patient had a lipid panel drawn 8 months ago with abnormal values.  Her triglycerides were 164, LDL 136, HDL 45, total cholesterol 295211.  According to these values her estimated 10-year ASCVD risk is 42.2%.  This estimation is substantial and a lipid panel will need to be drawn again to determine current ASCVD risk based on a more recent lipid panel.  According to the ASCVD risk of 42.2%, the patient is not taking the optimal dose of atorvastatin to decrease cholesterol levels. -Patient reports she needs a refill of her citalopram.  Current Medication: Outpatient Encounter Medications as of 03/16/2021  Medication Sig   atorvastatin (LIPITOR) 40 MG tablet Take 1 tablet (40 mg total) by mouth daily.   esomeprazole (NEXIUM) 40 MG capsule Take 1 capsule (40 mg total) by mouth daily.   fluticasone furoate-vilanterol (BREO ELLIPTA) 200-25 MCG/INH AEPB Inhale 1 puff into the lungs daily.   Ipratropium-Albuterol (COMBIVENT) 20-100 MCG/ACT AERS respimat Inhale into the lungs.   ipratropium-albuterol (DUONEB) 0.5-2.5 (3) MG/3ML SOLN Take 3  mLs by nebulization 2 (two) times a day as needed.   oxycodone (OXY-IR) 5 MG capsule Take by mouth.    traZODone (DESYREL) 50 MG tablet Take 0.5-1 tablets (25-50 mg total) by mouth at bedtime as needed for sleep.   [DISCONTINUED] atorvastatin (LIPITOR) 20 MG tablet Take 20 mg by mouth daily.   citalopram (CELEXA) 40 MG tablet Take 1 tablet (40 mg total) by mouth daily.   [DISCONTINUED] citalopram (CELEXA) 40 MG tablet Take 1 tablet (40 mg total) by mouth daily.   No facility-administered encounter medications on file as of 03/16/2021.    Surgical History: Past Surgical History:  Procedure Laterality Date   APPENDECTOMY     BACK SURGERY      Medical History: Past Medical History:  Diagnosis Date   Arthritis    CHF (congestive heart failure) (HCC)    COPD (chronic obstructive pulmonary disease) (HCC)    Depression    GERD (gastroesophageal reflux disease)    Stroke (HCC)     Family History: Family History  Family history unknown: Yes    Social History   Socioeconomic History   Marital status: Widowed    Spouse name: Not on file   Number of children: Not on file   Years of education: Not on file   Highest education level: Not on file  Occupational History   Not on file  Tobacco Use   Smoking status: Every Day    Packs/day: 0.50    Pack years: 0.00    Types: Cigarettes   Smokeless tobacco: Never  Vaping  Use   Vaping Use: Some days  Substance and Sexual Activity   Alcohol use: Not Currently    Comment: occasionally   Drug use: Never   Sexual activity: Yes  Other Topics Concern   Not on file  Social History Narrative   She lives alone at home and has a cat that she adores.    Social Determinants of Health   Financial Resource Strain: Not on file  Food Insecurity: Not on file  Transportation Needs: Not on file  Physical Activity: Not on file  Stress: Not on file  Social Connections: Not on file  Intimate Partner Violence: Not on file      Review of  Systems  Constitutional:  Negative for chills, fatigue and unexpected weight change.  HENT:  Negative for congestion, postnasal drip, rhinorrhea, sneezing and sore throat.   Eyes:  Negative for redness.  Respiratory:  Negative for cough, chest tightness and shortness of breath.   Cardiovascular:  Negative for chest pain and palpitations.  Gastrointestinal:  Negative for abdominal pain, constipation, diarrhea, nausea and vomiting.  Genitourinary:  Negative for dysuria and frequency.  Musculoskeletal:  Negative for arthralgias, back pain, joint swelling and neck pain.  Skin:  Negative for rash.  Neurological: Negative.  Negative for tremors and numbness.  Hematological:  Negative for adenopathy. Does not bruise/bleed easily.  Psychiatric/Behavioral:  Negative for behavioral problems (Depression), sleep disturbance and suicidal ideas. The patient is not nervous/anxious.    Vital Signs: BP (!) 142/80   Pulse 76   Temp 98.2 F (36.8 C)   Resp 16   Ht 5\' 2"  (1.575 m)   Wt 171 lb 3.2 oz (77.7 kg)   SpO2 91%   BMI 31.31 kg/m    Physical Exam Vitals reviewed.  Constitutional:      General: She is not in acute distress.    Appearance: She is well-developed. She is not diaphoretic.  HENT:     Head: Normocephalic and atraumatic.     Mouth/Throat:     Pharynx: No oropharyngeal exudate.  Eyes:     Pupils: Pupils are equal, round, and reactive to light.  Neck:     Thyroid: No thyromegaly.     Vascular: No JVD.     Trachea: No tracheal deviation.  Cardiovascular:     Rate and Rhythm: Normal rate and regular rhythm.     Heart sounds: Normal heart sounds. No murmur heard.   No friction rub. No gallop.  Pulmonary:     Effort: Pulmonary effort is normal. No respiratory distress.     Breath sounds: No wheezing or rales.  Chest:     Chest wall: No tenderness.  Abdominal:     General: Bowel sounds are normal.     Palpations: Abdomen is soft.  Musculoskeletal:        General: Normal  range of motion.     Cervical back: Normal range of motion and neck supple.  Lymphadenopathy:     Cervical: No cervical adenopathy.  Skin:    General: Skin is warm and dry.     Capillary Refill: Capillary refill takes less than 2 seconds.  Neurological:     Mental Status: She is alert and oriented to person, place, and time.  Psychiatric:        Behavior: Behavior normal.        Thought Content: Thought content normal.        Judgment: Judgment normal.       Assessment/Plan:  1.  Essential hypertension Patient has a history of hypertension and current blood pressure today is slightly elevated at 142/80.  She is not currently on any medications for her elevated blood pressure we will reevaluate at next follow-up visit.  2. Depression, recurrent (HCC) The patient has a history of depression and is currently taking citalopram 40 mg daily, refill ordered.  Depression is well controlled with current medication. - citalopram (CELEXA) 40 MG tablet; Take 1 tablet (40 mg total) by mouth daily.  Dispense: 90 tablet; Refill: 3   3. Mixed hyperlipidemia The patient's estimated 10-year ASCVD risk is 42.2%.  She was not on the optimal dose recommended for her level of ASCVD risk.  Atorvastatin was increased to 40 mg daily. - atorvastatin (LIPITOR) 40 MG tablet; Take 1 tablet (40 mg total) by mouth daily.  Dispense: 60 tablet; Refill: 1  4. Gastroesophageal reflux disease without esophagitis Patient has a history of gastroesophageal reflux and is taking esomeprazole 40 mg daily.  Current treatment is effective in controlling symptoms, no change at this time.  5. Chronic obstructive pulmonary disease, unspecified COPD type (HCC) Samples of Trelegy is given today, will need follow up CT lung due to increased risk   Pt was instructed to be careful to exposures to smoking, to sick people and to extreme weathers. To notify the office as soon as the patient starts to have any symptoms of cough ,  fatigue and fever. Proper use of MDI is stressed along with compliance.    What is lung cancer screening?   Lung cancer screening is a way in which doctors check the lungs for early signs of cancer in people who have no symptoms of lung cancer. Doctors suggest this screening for certain people who are at high risk of lung cancer because they smoke, or used to smoke. Although screening is not likely to be helpful for all smokers, doctors do think it might help prevent cancer deaths in people who smoke a lot or smoked for many years (even if they have already quit). Researchers have studied chest X-rays and "low-dose CT scans," two types of imaging tests, to see if they are good screening tools. Imaging tests create pictures of the inside of the body. A low-dose CT scan uses much less radiation than a typical CT scan and shows a more detailed image of the lungs than a standard X-ray. It turns out that chest X-rays do not work for screening for lung cancer. Low-dose CT scans, on the other hand, are helpful screening tools for some people at high risk of lung cancer. The goal of lung cancer screening is to find cancer early, before it has a chance to grow, spread, or cause problems. Several large studies found that smokers who were screened with low-dose CT scans were less likely to die of lung cancer than those who were screened with a standard X-ray.  The best way to lower your chances of getting or dying from lung cancer is to quit smoking. No matter how much or how long you have smoked, quitting is a good idea. Quitting now will reduce your chances not only of lung problems, but also of heart disease and many forms of cancer.   General Counseling: Jennifer Hayes understanding of the findings of todays visit and agrees with plan of treatment. I have discussed any further diagnostic evaluation that may be needed or ordered today. We also reviewed her medications today. she has been encouraged to call the  office with any questions  or concerns that should arise related to todays visit.    No orders of the defined types were placed in this encounter.   Meds ordered this encounter  Medications   citalopram (CELEXA) 40 MG tablet    Sig: Take 1 tablet (40 mg total) by mouth daily.    Dispense:  90 tablet    Refill:  3   atorvastatin (LIPITOR) 40 MG tablet    Sig: Take 1 tablet (40 mg total) by mouth daily.    Dispense:  60 tablet    Refill:  1    Return in about 4 weeks (around 04/13/2021) for F/U, pulmonary/sleep, med refill, Jennifer Hayes PCP.   Total time spent: 30 minutes Time spent includes review of chart, medications, test results, and follow up plan with the patient.   [place] Controlled Substance Database was reviewed by me.  This patient was seen by Jennifer Kuster, FNP-C in collaboration with Dr. Beverely Risen as a part of collaborative care agreement.   Jennifer Nabozny R. Tedd Sias, MSN, FNP-C Internal medicine

## 2021-03-20 DIAGNOSIS — F339 Major depressive disorder, recurrent, unspecified: Secondary | ICD-10-CM | POA: Insufficient documentation

## 2021-03-20 DIAGNOSIS — F331 Major depressive disorder, recurrent, moderate: Secondary | ICD-10-CM | POA: Insufficient documentation

## 2021-03-20 DIAGNOSIS — F3342 Major depressive disorder, recurrent, in full remission: Secondary | ICD-10-CM | POA: Insufficient documentation

## 2021-03-26 ENCOUNTER — Encounter: Payer: Self-pay | Admitting: Nurse Practitioner

## 2021-03-30 ENCOUNTER — Ambulatory Visit: Admitting: Nurse Practitioner

## 2021-04-02 IMAGING — CR DG CHEST 2V
1 series · 2 of 2 positions shown · non-contrast
Comparison: 12/14/2019.  06/06/2015.  03/11/2013.

CLINICAL DATA: Shortness of breath.

EXAM:
CHEST - 2 VIEW

[Series 1: dg chest 2 view · 0.14mm/px · 2 of 2 slices shown]
[im 1/2]
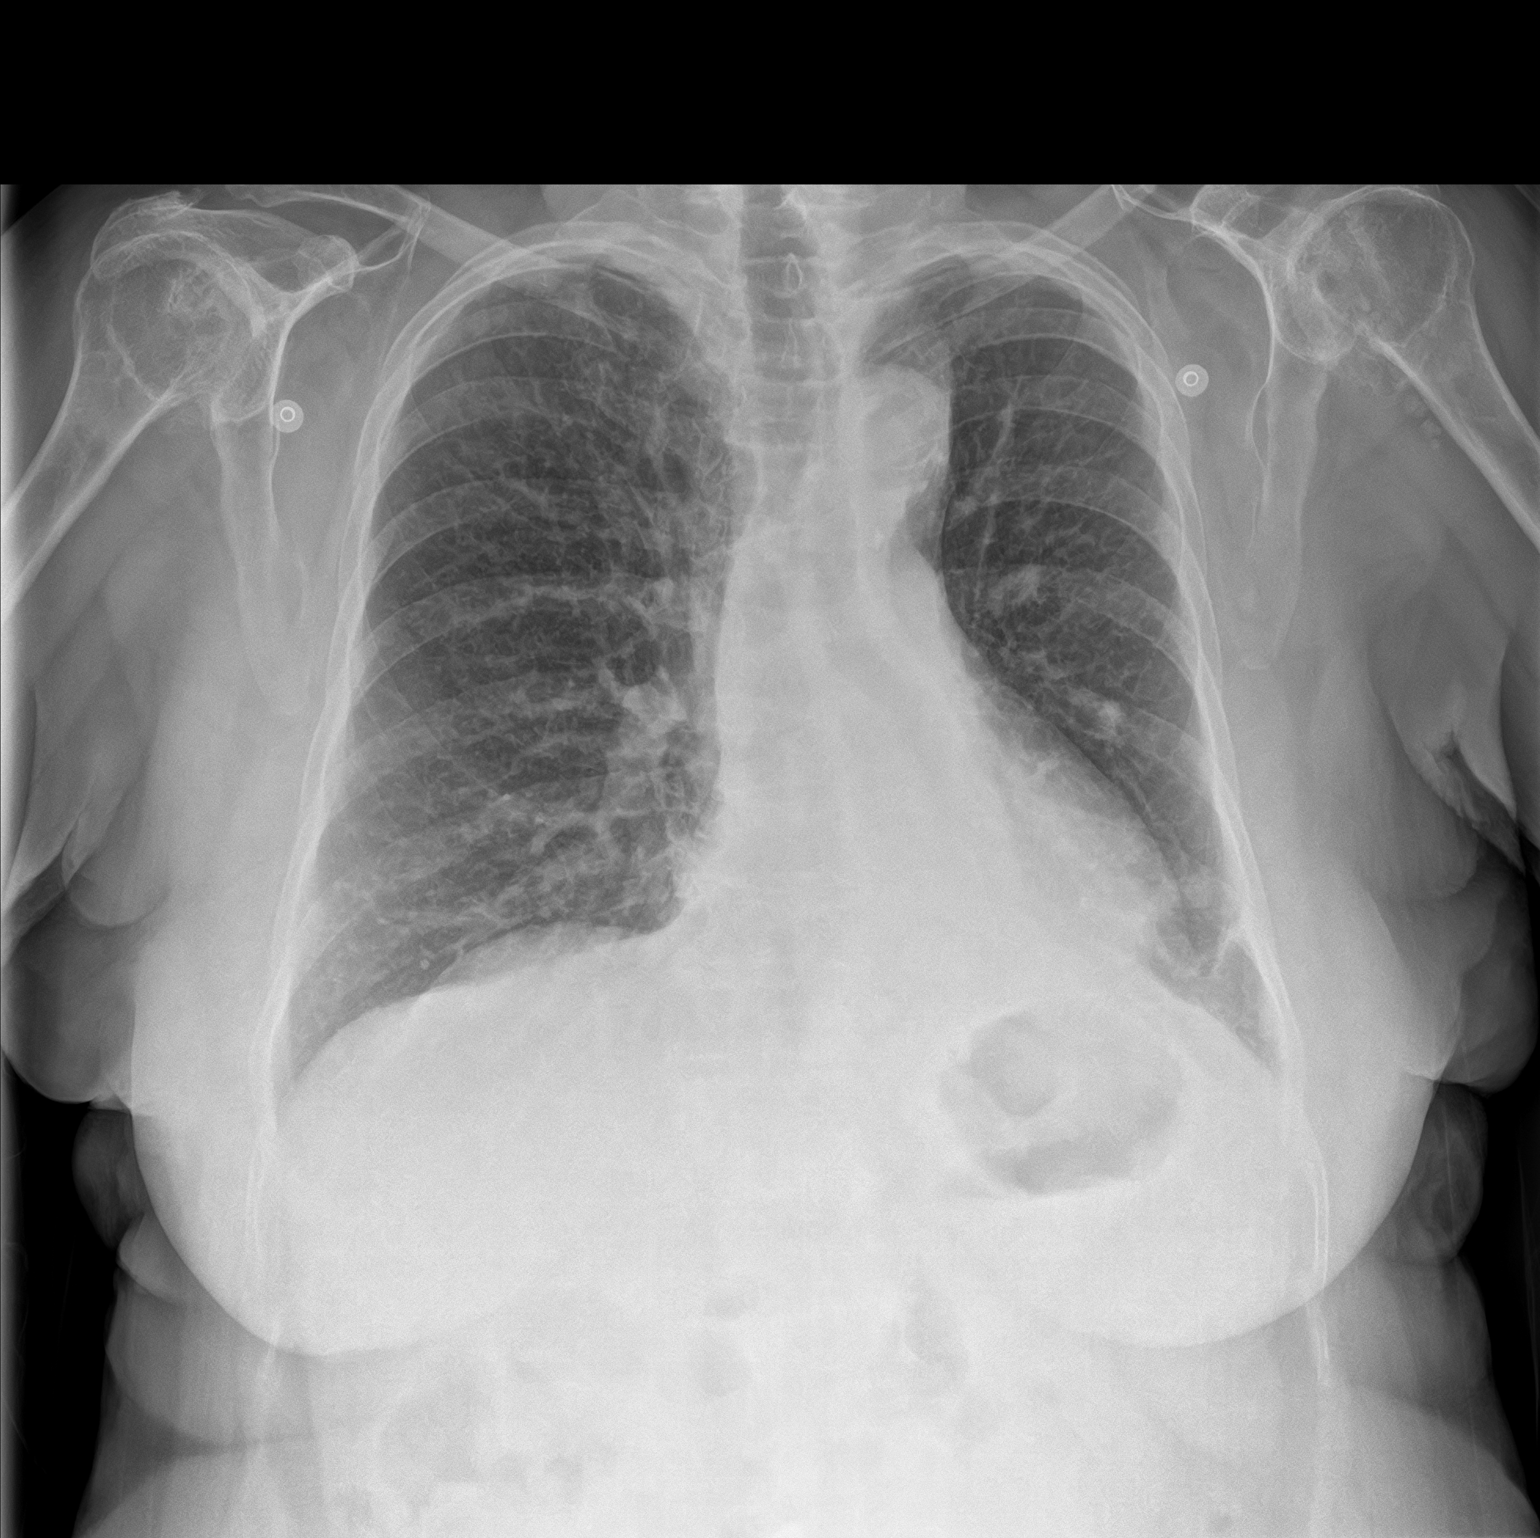
[im 2/2]
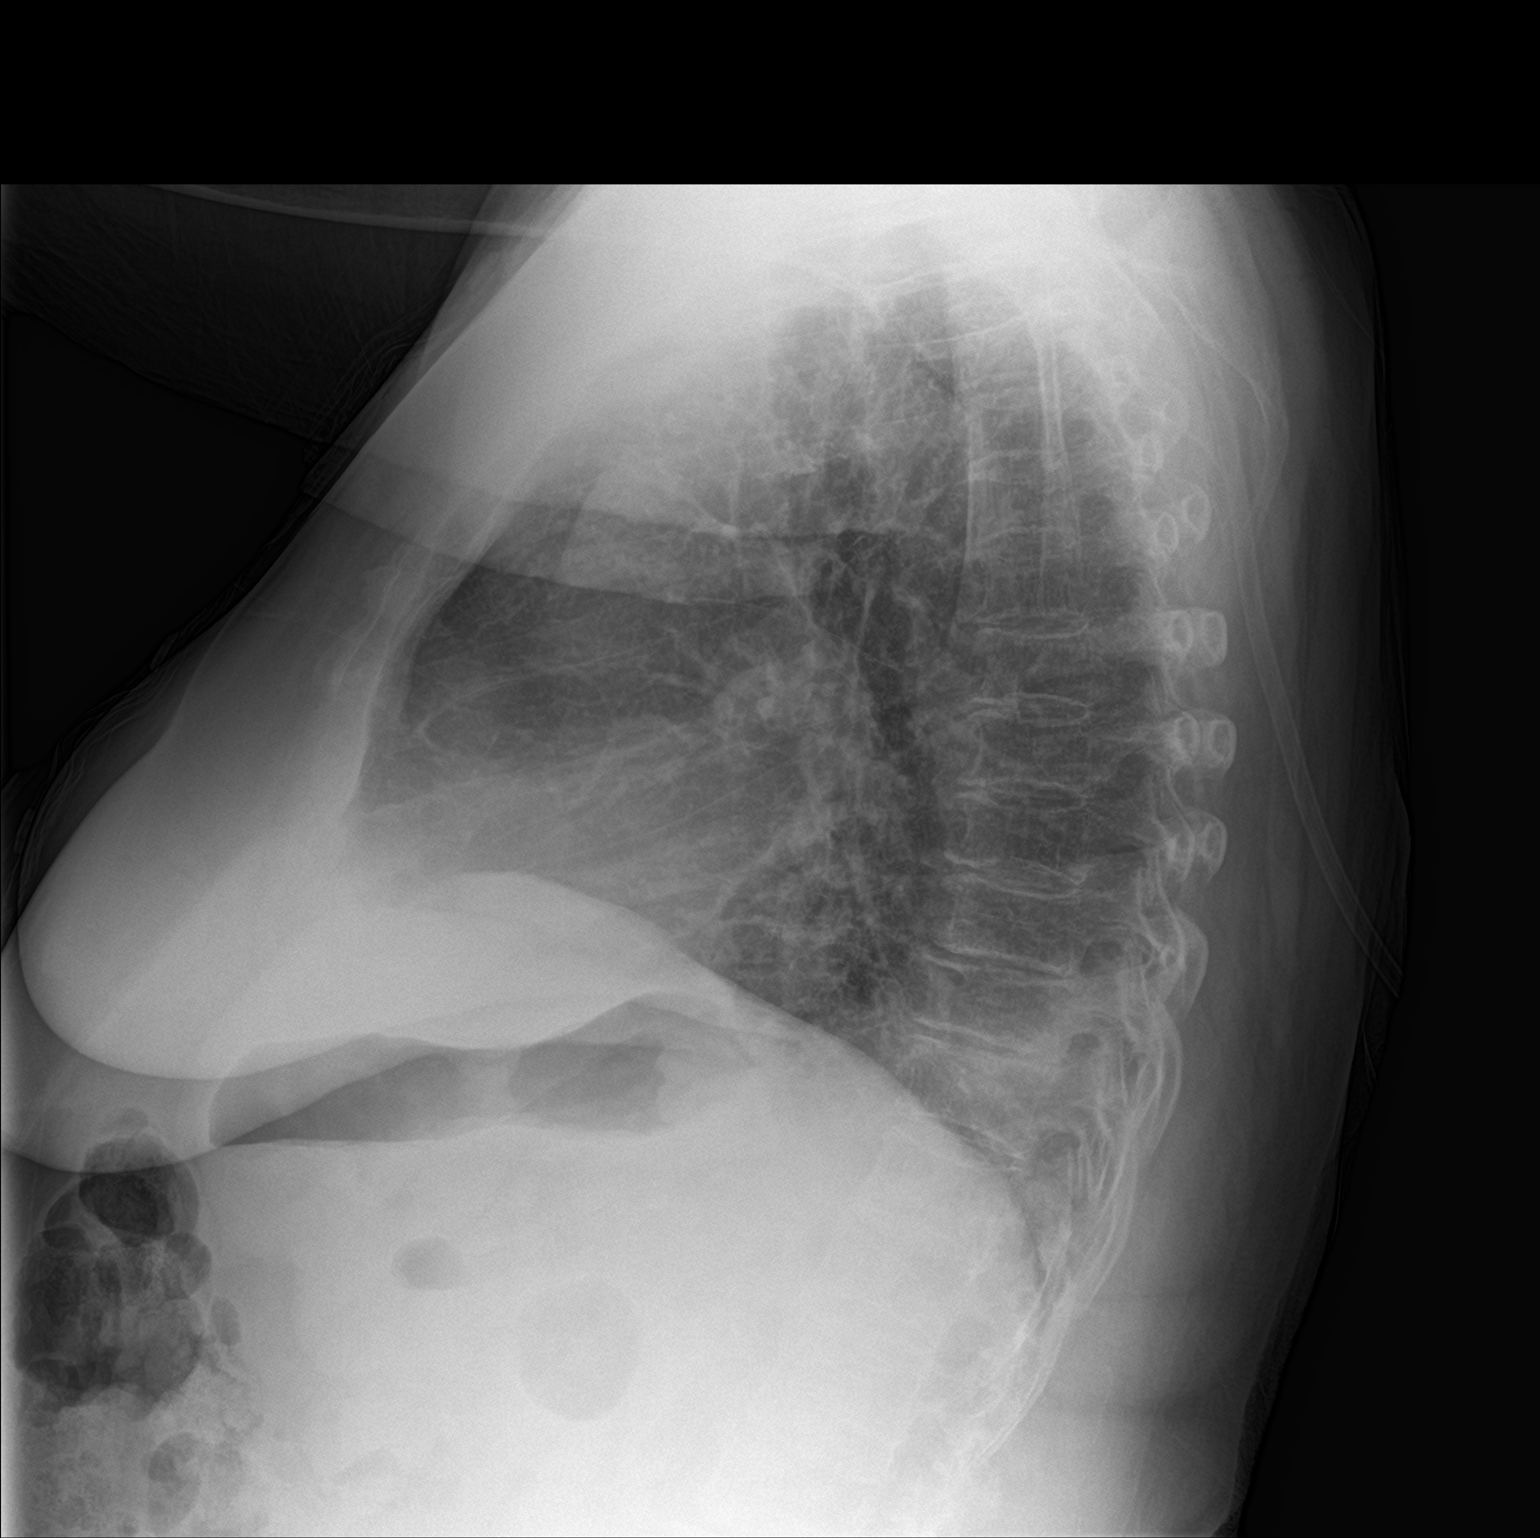

[2 of 2 positions shown; findings below may reference images not displayed]

FINDINGS: Mediastinum and hilar structures normal. Heart size normal. Left
base infiltrate suggesting pneumonia. Heart size normal chronic
interstitial prominence. Mild bibasilar subsegmental atelectasis and
or scarring. No pleural effusion or pneumothorax. Degenerative
change thoracic spine. Degenerative changes both shoulders.
IMPRESSION: 1.  New left base infiltrate suggesting pneumonia.

2. Probable chronic interstitial lung disease. Bibasilar
subsegmental atelectasis and or scarring.

## 2021-04-12 ENCOUNTER — Other Ambulatory Visit: Payer: Self-pay

## 2021-04-12 ENCOUNTER — Encounter: Payer: Self-pay | Admitting: Nurse Practitioner

## 2021-04-12 ENCOUNTER — Ambulatory Visit: Payer: Medicare Other | Admitting: Nurse Practitioner

## 2021-04-12 VITALS — BP 142/78 | HR 89 | Temp 98.7°F | Resp 16 | Ht 62.0 in | Wt 171.6 lb

## 2021-04-12 DIAGNOSIS — R350 Frequency of micturition: Secondary | ICD-10-CM

## 2021-04-12 DIAGNOSIS — F5101 Primary insomnia: Secondary | ICD-10-CM

## 2021-04-12 DIAGNOSIS — I10 Essential (primary) hypertension: Secondary | ICD-10-CM

## 2021-04-12 DIAGNOSIS — F17210 Nicotine dependence, cigarettes, uncomplicated: Secondary | ICD-10-CM

## 2021-04-12 DIAGNOSIS — F339 Major depressive disorder, recurrent, unspecified: Secondary | ICD-10-CM

## 2021-04-12 DIAGNOSIS — J449 Chronic obstructive pulmonary disease, unspecified: Secondary | ICD-10-CM

## 2021-04-12 LAB — POCT URINALYSIS DIPSTICK
Bilirubin, UA: NEGATIVE
Blood, UA: NEGATIVE
Glucose, UA: NEGATIVE
Ketones, UA: NEGATIVE
Leukocytes, UA: NEGATIVE
Nitrite, UA: NEGATIVE
Protein, UA: NEGATIVE
Spec Grav, UA: 1.02 (ref 1.010–1.025)
Urobilinogen, UA: NEGATIVE E.U./dL — AB
pH, UA: 6 (ref 5.0–8.0)

## 2021-04-12 MED ORDER — TRAZODONE HCL 50 MG PO TABS: 25.0000 mg | ORAL_TABLET | Freq: Every evening | ORAL | 1 refills | Status: DC | PRN

## 2021-04-12 MED ORDER — TRELEGY ELLIPTA 100-62.5-25 MCG/INH IN AEPB: 1.0000 | INHALATION_SPRAY | Freq: Every day | RESPIRATORY_TRACT | 11 refills | Status: DC

## 2021-04-12 MED ORDER — ARIPIPRAZOLE 2 MG PO TABS: 2.0000 mg | ORAL_TABLET | Freq: Every day | ORAL | 0 refills | Status: DC

## 2021-04-12 MED ORDER — AMLODIPINE BESYLATE 2.5 MG PO TABS: 2.5000 mg | ORAL_TABLET | Freq: Every day | ORAL | 0 refills | Status: DC

## 2021-04-12 NOTE — Progress Notes (Signed)
[facility] [address] [place], [place] [address]  Internal MEDICINE  Office Visit Note  Patient Name: Jennifer Hayes  [id number]  [id number]  Date of Service: 04/12/2021  Chief Complaint  Patient presents with   Follow-up    Pulmonary, med refill, urinating more than normal, very tired    HPI Kaity presents for a follow up visit for medication refill. She is also urinating frequently and experiencing fatigue. She moved to [facility] approximately 1.5 years ago and has felt depressed because she hates living in [place] and she feels so alone because her children are grown and have other things to do. She is considering moving to [place] because she has a family member who lives there and is about the same age as her. Bise feels like she is urinating too frequently. She denies any urgency, dysuria, hematuria, incontinence, pain or burning with urination. She denies any suprapubic tenderness or flank pain.  Yarisbel reports that she is feeling more depressed and lonely. Because she has been feeling this way, she started doubling up on her citalopram. She was already taking 40 mg which is the maximum recommended daily dose so she was taking double the max recommended dose.    Current Medication: Outpatient Encounter Medications as of 04/12/2021  Medication Sig   amLODipine (NORVASC) 2.5 MG tablet Take 1 tablet (2.5 mg total) by mouth daily.   atorvastatin (LIPITOR) 40 MG tablet Take 1 tablet (40 mg total) by mouth daily.   citalopram (CELEXA) 40 MG tablet Take 1 tablet (40 mg total) by mouth daily.   esomeprazole (NEXIUM) 40 MG capsule Take 1 capsule (40 mg total) by mouth daily.   Fluticasone-Umeclidin-Vilant (TRELEGY ELLIPTA) 100-62.5-25 MCG/INH AEPB Inhale 1 puff into the lungs daily.   Ipratropium-Albuterol (COMBIVENT) 20-100 MCG/ACT AERS respimat Inhale into the lungs.   ipratropium-albuterol (DUONEB) 0.5-2.5 (3) MG/3ML SOLN Take 3 mLs by nebulization  2 (two) times a day as needed.   oxycodone (OXY-IR) 5 MG capsule Take by mouth.    [DISCONTINUED] ARIPiprazole (ABILIFY) 2 MG tablet Take 1 tablet (2 mg total) by mouth daily.   [DISCONTINUED] fluticasone furoate-vilanterol (BREO ELLIPTA) 200-25 MCG/INH AEPB Inhale 1 puff into the lungs daily.   [DISCONTINUED] traZODone (DESYREL) 50 MG tablet Take 0.5-1 tablets (25-50 mg total) by mouth at bedtime as needed for sleep.   ARIPiprazole (ABILIFY) 2 MG tablet Take 1 tablet (2 mg total) by mouth daily.   traZODone (DESYREL) 50 MG tablet Take 0.5-1 tablets (25-50 mg total) by mouth at bedtime as needed for sleep.   [DISCONTINUED] traZODone (DESYREL) 50 MG tablet Take 0.5-1 tablets (25-50 mg total) by mouth at bedtime as needed for sleep.   No facility-administered encounter medications on file as of 04/12/2021.    Surgical History: Past Surgical History:  Procedure Laterality Date   APPENDECTOMY     BACK SURGERY      Medical History: Past Medical History:  Diagnosis Date   Arthritis    CHF (congestive heart failure) (HCC)    COPD (chronic obstructive pulmonary disease) (HCC)    Depression    GERD (gastroesophageal reflux disease)    Stroke (HCC)     Family History: Family History  Family history unknown: Yes    Social History   Socioeconomic History   Marital status: Widowed    Spouse name: Not on file   Number of children: Not on file   Years of education: Not on file   Highest education level: Not on file  Occupational History   Not on file  Tobacco Use   Smoking status: Every Day    Packs/day: 0.50    Pack years: 0.00    Types: Cigarettes   Smokeless tobacco: Never  Vaping Use   Vaping Use: Some days  Substance and Sexual Activity   Alcohol use: Not Currently    Comment: occasionally   Drug use: Never   Sexual activity: Yes  Other Topics Concern   Not on file  Social History Narrative   She lives alone at home and has a cat that she adores.    Social  Determinants of Health   Financial Resource Strain: Not on file  Food Insecurity: Not on file  Transportation Needs: Not on file  Physical Activity: Not on file  Stress: Not on file  Social Connections: Not on file  Intimate Partner Violence: Not on file      Review of Systems  Constitutional:  Positive for fatigue. Negative for chills and unexpected weight change.  HENT:  Negative for congestion, rhinorrhea, sneezing and sore throat.   Eyes:  Negative for redness.  Respiratory:  Negative for cough, chest tightness and shortness of breath.   Cardiovascular:  Negative for chest pain and palpitations.  Gastrointestinal:  Negative for abdominal pain, constipation, diarrhea, nausea and vomiting.  Endocrine: Positive for polyuria.  Genitourinary:  Negative for dysuria and frequency.  Musculoskeletal:  Negative for arthralgias, back pain, joint swelling and neck pain.  Skin:  Negative for rash.  Neurological: Negative.  Negative for tremors and numbness.  Hematological:  Negative for adenopathy. Does not bruise/bleed easily.  Psychiatric/Behavioral:  Negative for behavioral problems (Depression), sleep disturbance and suicidal ideas. The patient is not nervous/anxious.    Vital Signs: BP (!) 142/78   Pulse 89   Temp 98.7 F (37.1 C)   Resp 16   Ht 5\' 2"  (1.575 m)   Wt 171 lb 9.6 oz (77.8 kg)   SpO2 92%   BMI 31.39 kg/m    Physical Exam Constitutional:      General: She is not in acute distress.    Appearance: Normal appearance. She is obese. She is not ill-appearing.  HENT:     Head: Normocephalic and atraumatic.  Cardiovascular:     Rate and Rhythm: Normal rate and regular rhythm.     Pulses: Normal pulses.  Pulmonary:     Effort: Pulmonary effort is normal.  Skin:    General: Skin is warm and dry.     Capillary Refill: Capillary refill takes less than 2 seconds.  Neurological:     Mental Status: She is alert and oriented to person, place, and time.  Psychiatric:         Mood and Affect: Mood normal.        Behavior: Behavior normal.     Assessment/Plan: 1. Depression, recurrent (HCC) Omni's depressive symptoms are not adequately controlled by citalopram 40 mg daily. Instructed patient to only take 1 40-mg tablet of citalopram per day. 2 mg of aripiprazole added as an adjunct therapy will reevaluate effectiveness in 4 weeks.  - ARIPiprazole (ABILIFY) 2 MG tablet; Take 1 tablet (2 mg total) by mouth daily.  Dispense: 30 tablet; Refill: 0  2. Primary insomnia She takes trazodone for insomnia. Refill ordered. - traZODone (DESYREL) 50 MG tablet; Take 0.5-1 tablets (25-50 mg total) by mouth at bedtime as needed for sleep.  Dispense: 90 tablet; Refill: 1  3. Essential hypertension Stable on current medications, refill ordered.  -  amLODipine (NORVASC) 2.5 MG tablet; Take 1 tablet (2.5 mg total) by mouth daily.  Dispense: 30 tablet; Refill: 0  4. Chronic obstructive pulmonary disease, unspecified COPD type (HCC) Trelegy has been working well for [employer], prescription sent to pharmacy.  - Fluticasone-Umeclidin-Vilant (TRELEGY ELLIPTA) 100-62.5-25 MCG/INH AEPB; Inhale 1 puff into the lungs daily.  Dispense: 1 each; Refill: 11  5. Continuous dependence on cigarette smoking Smoking cessation offered, patient is interested but wants to get her depression under control first.  Smoking cessation instruction/counseling given:  counseled patient on the dangers of tobacco use, advised patient to stop smoking, and reviewed strategies to maximize success   6. Urinary frequency Routine urinalysis done.  - POCT urinalysis dipstick   General Counseling: Kristalynn verbalizes understanding of the findings of todays visit and agrees with plan of treatment. I have discussed any further diagnostic evaluation that may be needed or ordered today. We also reviewed her medications today. she has been encouraged to call the office with any questions or concerns that should  arise related to todays visit.    Orders Placed This Encounter  Procedures   POCT urinalysis dipstick     Meds ordered this encounter  Medications   DISCONTD: ARIPiprazole (ABILIFY) 2 MG tablet    Sig: Take 1 tablet (2 mg total) by mouth daily.    Dispense:  30 tablet    Refill:  0   DISCONTD: traZODone (DESYREL) 50 MG tablet    Sig: Take 0.5-1 tablets (25-50 mg total) by mouth at bedtime as needed for sleep.    Dispense:  90 tablet    Refill:  1   Fluticasone-Umeclidin-Vilant (TRELEGY ELLIPTA) 100-62.5-25 MCG/INH AEPB    Sig: Inhale 1 puff into the lungs daily.    Dispense:  1 each    Refill:  11   amLODipine (NORVASC) 2.5 MG tablet    Sig: Take 1 tablet (2.5 mg total) by mouth daily.    Dispense:  30 tablet    Refill:  0   ARIPiprazole (ABILIFY) 2 MG tablet    Sig: Take 1 tablet (2 mg total) by mouth daily.    Dispense:  30 tablet    Refill:  0   traZODone (DESYREL) 50 MG tablet    Sig: Take 0.5-1 tablets (25-50 mg total) by mouth at bedtime as needed for sleep.    Dispense:  90 tablet    Refill:  1     Return in about 4 weeks (around 05/10/2021) for F/U, BP check, Anxiety/depression, Jeb Schloemer PCP or earliest available with me.   Total time spent:30 Minutes Time spent includes review of chart, medications, test results, and follow up plan with the patient.   [place] Controlled Substance Database was reviewed by me.  This patient was seen by Sallyanne Kuster, FNP-C in collaboration with Dr. Beverely Risen as a part of collaborative care agreement.   Shamone Winzer R. Tedd Sias, MSN, FNP-C Internal medicine

## 2021-04-13 ENCOUNTER — Ambulatory Visit: Admitting: Nurse Practitioner

## 2021-05-03 ENCOUNTER — Telehealth: Payer: Self-pay

## 2021-05-03 NOTE — Telephone Encounter (Signed)
Pt LMOM that her oxygen machine wasn't working right, I called pt back and LMOM that they should call the company that brings their oxygen and let them know what is wrong with it, they they should handle any problems.

## 2021-05-10 ENCOUNTER — Ambulatory Visit: Admitting: Nurse Practitioner

## 2021-05-16 ENCOUNTER — Other Ambulatory Visit: Payer: Self-pay

## 2021-05-16 ENCOUNTER — Ambulatory Visit (INDEPENDENT_AMBULATORY_CARE_PROVIDER_SITE_OTHER): Payer: Medicare Other | Admitting: Internal Medicine

## 2021-05-16 ENCOUNTER — Encounter: Payer: Self-pay | Admitting: Internal Medicine

## 2021-05-16 VITALS — BP 112/70 | HR 73 | Temp 98.0°F | Resp 16 | Ht 64.0 in | Wt 168.0 lb

## 2021-05-16 DIAGNOSIS — R0602 Shortness of breath: Secondary | ICD-10-CM

## 2021-05-16 DIAGNOSIS — F17219 Nicotine dependence, cigarettes, with unspecified nicotine-induced disorders: Secondary | ICD-10-CM

## 2021-05-16 DIAGNOSIS — R0600 Dyspnea, unspecified: Secondary | ICD-10-CM

## 2021-05-16 DIAGNOSIS — R079 Chest pain, unspecified: Secondary | ICD-10-CM

## 2021-05-16 DIAGNOSIS — J449 Chronic obstructive pulmonary disease, unspecified: Secondary | ICD-10-CM

## 2021-05-16 DIAGNOSIS — R0609 Other forms of dyspnea: Secondary | ICD-10-CM

## 2021-05-16 NOTE — Patient Instructions (Signed)
Steps to Quit Smoking Smoking tobacco is the leading cause of preventable death. It can affect almost every organ in the body. Smoking puts you and people around you at risk for many serious, long-lasting (chronic) diseases. Quitting smoking can be hard, but it is one of the best things thatyou can do for your health. It is never too late to quit. How do I get ready to quit? When you decide to quit smoking, make a plan to help you succeed. Before you quit: Pick a date to quit. Set a date within the next 2 weeks to give you time to prepare. Write down the reasons why you are quitting. Keep this list in places where you will see it often. Tell your family, friends, and co-workers that you are quitting. Their support is important. Talk with your doctor about the choices that may help you quit. Find out if your health insurance will pay for these treatments. Know the people, places, things, and activities that make you want to smoke (triggers). Avoid them. What first steps can I take to quit smoking? Throw away all cigarettes at home, at work, and in your car. Throw away the things that you use when you smoke, such as ashtrays and lighters. Clean your car. Make sure to empty the ashtray. Clean your home, including curtains and carpets. What can I do to help me quit smoking? Talk with your doctor about taking medicines and seeing a counselor at the same time. You are more likely to succeed when you do both. If you are pregnant or breastfeeding, talk with your doctor about counseling or other ways to quit smoking. Do not take medicine to help you quit smoking unless your doctor tells you to do so. To quit smoking: Quit right away Quit smoking totally, instead of slowly cutting back on how much you smoke over a period of time. Go to counseling. You are more likely to quit if you go to counseling sessions regularly. Take medicine You may take medicines to help you quit. Some medicines need a  prescription, and some you can buy over-the-counter. Some medicines may contain a drug called nicotine to replace the nicotine in cigarettes. Medicines may: Help you to stop having the desire to smoke (cravings). Help to stop the problems that come when you stop smoking (withdrawal symptoms). Your doctor may ask you to use: Nicotine patches, gum, or lozenges. Nicotine inhalers or sprays. Non-nicotine medicine that is taken by mouth. Find resources Find resources and other ways to help you quit smoking and remain smoke-free after you quit. These resources are most helpful when you use them often. They include: Online chats with a Social worker. Phone quitlines. Printed Furniture conservator/restorer. Support groups or group counseling. Text messaging programs. Mobile phone apps. Use apps on your mobile phone or tablet that can help you stick to your quit plan. There are many free apps for mobile phones and tablets as well as websites. Examples include Quit Guide from the [employer] and smokefree.gov  What things can I do to make it easier to quit?  Talk to your family and friends. Ask them to support and encourage you. Call a phone quitline (1-800-QUIT-NOW), reach out to support groups, or work with a Social worker. Ask people who smoke to not smoke around you. Avoid places that make you want to smoke, such as: Bars. Parties. Smoke-break areas at work. Spend time with people who do not smoke. Lower the stress in your life. Stress can make you want to smoke.  Try these things to help your stress: Getting regular exercise. Doing deep-breathing exercises. Doing yoga. Meditating. Doing a body scan. To do this, close your eyes, focus on one area of your body at a time from head to toe. Notice which parts of your body are tense. Try to relax the muscles in those areas. How will I feel when I quit smoking? Day 1 to 3 weeks Within the first 24 hours, you may start to have some problems that come from quitting tobacco.  These problems are very bad 2-3 days after you quit, but they do not often last for more than 2-3 weeks. You may get these symptoms: Mood swings. Feeling restless, nervous, angry, or annoyed. Trouble concentrating. Dizziness. Strong desire for high-sugar foods and nicotine. Weight gain. Trouble pooping (constipation). Feeling like you may vomit (nausea). Coughing or a sore throat. Changes in how the medicines that you take for other issues work in your body. Depression. Trouble sleeping (insomnia). Week 3 and afterward After the first 2-3 weeks of quitting, you may start to notice more positive results, such as: Better sense of smell and taste. Less coughing and sore throat. Slower heart rate. Lower blood pressure. Clearer skin. Better breathing. Fewer sick days. Quitting smoking can be hard. Do not give up if you fail the first time. Some people need to try a few times before they succeed. Do your best to stick to your quit plan, and talk with yourdoctor if you have any questions or concerns. Summary Smoking tobacco is the leading cause of preventable death. Quitting smoking can be hard, but it is one of the best things that you can do for your health. When you decide to quit smoking, make a plan to help you succeed. Quit smoking right away, not slowly over a period of time. When you start quitting, seek help from your doctor, family, or friends. This information is not intended to replace advice given to you by your health care provider. Make sure you discuss any questions you have with your healthcare provider. Document Revised: 06/20/2019 Document Reviewed: 12/14/2018 Elsevier Patient Education  2022 Elsevier Inc. Chronic Obstructive Pulmonary Disease  Chronic obstructive pulmonary disease (COPD) is a long-term (chronic) lung problem. When you have COPD, it is hard for air to get in and out ofyour lungs. Usually the condition gets worse over time, and your lungs will never  return tonormal. There are things you can do to keep yourself as healthy as possible. What are the causes? Smoking. This is the most common cause. Certain genes passed from parent to child (inherited). What increases the risk? Being exposed to secondhand smoke from cigarettes, pipes, or cigars. Being exposed to chemicals and other irritants, such as fumes and dust in the work environment. Having chronic lung conditions or infections. What are the signs or symptoms? Shortness of breath, especially during physical activity. A long-term cough with a large amount of thick mucus. Sometimes, the cough may not have any mucus (dry cough). Wheezing. Breathing quickly. Skin that looks gray or blue, especially in the fingers, toes, or lips. Feeling tired (fatigue). Weight loss. Chest tightness. Having infections often. Episodes when breathing symptoms become much worse (exacerbations). At the later stages of this disease, you may have swelling in the ankles, feet,or legs. How is this treated? Taking medicines. Quitting smoking, if you smoke. Rehabilitation. This includes steps to make your body work better. It may involve a team of specialists. Doing exercises. Making changes to your diet. Using oxygen. Lung  surgery. Lung transplant. Comfort measures (palliative care). Follow these instructions at home: Medicines Take over-the-counter and prescription medicines only as told by your doctor. Talk to your doctor before taking any cough or allergy medicines. You may need to avoid medicines that cause your lungs to be dry. Lifestyle If you smoke, stop smoking. Smoking makes the problem worse. Do not smoke or use any products that contain nicotine or tobacco. If you need help quitting, ask your doctor. Avoid being around things that make your breathing worse. This may include smoke, chemicals, and fumes. Stay active, but remember to rest as well. Learn and use tips on how to manage stress and  control your breathing. Make sure you get enough sleep. Most adults need at least 7 hours of sleep every night. Eat healthy foods. Eat smaller meals more often. Rest before meals. Controlled breathing Learn and use tips on how to control your breathing as told by your doctor. Try: Breathing in (inhaling) through your nose for 1 second. Then, pucker your lips and breath out (exhale) through your lips for 2 seconds. Putting one hand on your belly (abdomen). Breathe in slowly through your nose for 1 second. Your hand on your belly should move out. Pucker your lips and breathe out slowly through your lips. Your hand on your belly should move in as you breathe out.  Controlled coughing Learn and use controlled coughing to clear mucus from your lungs. Follow these steps: Lean your head a little forward. Breathe in deeply. Try to hold your breath for 3 seconds. Keep your mouth slightly open while coughing 2 times. Spit any mucus out into a tissue. Rest and do the steps again 1 or 2 times as needed. General instructions Make sure you get all the shots (vaccines) that your doctor recommends. Ask your doctor about a flu shot and a pneumonia shot. Use oxygen therapy and pulmonary rehabilitation if told by your doctor. If you need home oxygen therapy, ask your doctor if you should buy a tool to measure your oxygen level (oximeter). Make a COPD action plan with your doctor. This helps you to know what to do if you feel worse than usual. Manage any other conditions you have as told by your doctor. Avoid going outside when it is very hot, cold, or humid. Avoid people who have a sickness you can catch (contagious). Keep all follow-up visits. Contact a doctor if: You cough up more mucus than usual. There is a change in the color or thickness of the mucus. It is harder to breathe than usual. Your breathing is faster than usual. You have trouble sleeping. You need to use your medicines more often than  usual. You have trouble doing your normal activities such as getting dressed or walking around the house. Get help right away if: You have shortness of breath while resting. You have shortness of breath that stops you from: Being able to talk. Doing normal activities. Your chest hurts for longer than 5 minutes. Your skin color is more blue than usual. Your pulse oximeter shows that you have low oxygen for longer than 5 minutes. You have a fever. You feel too tired to breathe normally. These symptoms may represent a serious problem that is an emergency. Do not wait to see if the symptoms will go away. Get medical help right away. Call your local emergency services (911 in the [place] Do not drive yourself to the hospital. Summary Chronic obstructive pulmonary disease (COPD) is a long-term lung problem. The  way your lungs work will never return to normal. Usually the condition gets worse over time. There are things you can do to keep yourself as healthy as possible. Take over-the-counter and prescription medicines only as told by your doctor. If you smoke, stop. Smoking makes the problem worse. This information is not intended to replace advice given to you by your health care provider. Make sure you discuss any questions you have with your healthcare provider. Document Revised: 08/03/2020 Document Reviewed: 08/03/2020 Elsevier Patient Education  2022 [employer].

## 2021-05-16 NOTE — Progress Notes (Signed)
[facility] [address] [place], [place] [address]  Pulmonary Sleep Medicine   Office Visit Note  Patient Name: Jennifer Hayes DOB: Jan 29, 1941 MRN [id number]  Date of Service: 05/16/2021  Complaints/HPI: Oxygen qualification. Recently moved back to [place]. lost her husband 6 years ago.  She has had an ongoing problem with ongoing cigarette smoke.  She had COPD prior when she was living here in [place].  States that she still has shortness of breath she has some cough and congestion.  The patient states that she had been on oxygen and needs to be requalified.  She states that she has been having chest pain no palpitations.  No recent PFTs on file.  We also do not have a 6-minute walk on file.  Also has been having some arm pain.  The pain has a history of chronic arthritis.  No nausea no vomiting no fevers no chills.  ROS  General: (-) fever, (-) chills, (-) night sweats, (-) weakness Skin: (-) rashes, (-) itching,. Eyes: (-) visual changes, (-) redness, (-) itching. Nose and Sinuses: (-) nasal stuffiness or itchiness, (-) postnasal drip, (-) nosebleeds, (-) sinus trouble. Mouth and Throat: (-) sore throat, (-) hoarseness. Neck: (-) swollen glands, (-) enlarged thyroid, (-) neck pain. Respiratory: + cough, (-) bloody sputum, + shortness of breath, + wheezing. Cardiovascular: - ankle swelling, (-) chest pain. Lymphatic: (-) lymph node enlargement. Neurologic: (-) numbness, (-) tingling. Psychiatric: (-) anxiety, (-) depression   Current Medication: Outpatient Encounter Medications as of 05/16/2021  Medication Sig   amLODipine (NORVASC) 2.5 MG tablet Take 1 tablet (2.5 mg total) by mouth daily.   ARIPiprazole (ABILIFY) 2 MG tablet Take 1 tablet (2 mg total) by mouth daily.   atorvastatin (LIPITOR) 40 MG tablet Take 1 tablet (40 mg total) by mouth daily.   citalopram (CELEXA) 40 MG tablet Take 1 tablet (40 mg total) by mouth daily.   esomeprazole (NEXIUM)  40 MG capsule Take 1 capsule (40 mg total) by mouth daily.   Fluticasone-Umeclidin-Vilant (TRELEGY ELLIPTA) 100-62.5-25 MCG/INH AEPB Inhale 1 puff into the lungs daily.   Ipratropium-Albuterol (COMBIVENT) 20-100 MCG/ACT AERS respimat Inhale into the lungs.   ipratropium-albuterol (DUONEB) 0.5-2.5 (3) MG/3ML SOLN Take 3 mLs by nebulization 2 (two) times a day as needed.   oxycodone (OXY-IR) 5 MG capsule Take by mouth.    traZODone (DESYREL) 50 MG tablet Take 0.5-1 tablets (25-50 mg total) by mouth at bedtime as needed for sleep.   No facility-administered encounter medications on file as of 05/16/2021.    Surgical History: Past Surgical History:  Procedure Laterality Date   APPENDECTOMY     BACK SURGERY      Medical History: Past Medical History:  Diagnosis Date   Arthritis    CHF (congestive heart failure) (HCC)    COPD (chronic obstructive pulmonary disease) (HCC)    Depression    GERD (gastroesophageal reflux disease)    Stroke (HCC)     Family History: Family History  Family history unknown: Yes    Social History: Social History   Socioeconomic History   Marital status: Widowed    Spouse name: Not on file   Number of children: Not on file   Years of education: Not on file   Highest education level: Not on file  Occupational History   Not on file  Tobacco Use   Smoking status: Every Day    Packs/day: 0.50    Types: Cigarettes   Smokeless tobacco: Never  Vaping  Use   Vaping Use: Some days  Substance and Sexual Activity   Alcohol use: Not Currently    Comment: occasionally   Drug use: Never   Sexual activity: Yes  Other Topics Concern   Not on file  Social History Narrative   She lives alone at home and has a cat that she adores.    Social Determinants of Health   Financial Resource Strain: Not on file  Food Insecurity: Not on file  Transportation Needs: Not on file  Physical Activity: Not on file  Stress: Not on file  Social Connections: Not on file   Intimate Partner Violence: Not on file    Vital Signs: Blood pressure 112/70, pulse 73, temperature 98 F (36.7 C), resp. rate 16, height 5\' 4"  (1.626 m), weight 168 lb (76.2 kg), SpO2 92 %.  Examination: General Appearance: The patient is well-developed, well-nourished, and in no distress. Skin: Gross inspection of skin unremarkable. Head: normocephalic, no gross deformities. Eyes: no gross deformities noted. ENT: ears appear grossly normal no exudates. Neck: Supple. No thyromegaly. No LAD. Respiratory: few rhonchi noted. Cardiovascular: Normal S1 and S2 without murmur or rub. Extremities: No cyanosis. pulses are equal. Neurologic: Alert and oriented. No involuntary movements.  LABS: Recent Results (from the past 2160 hour(s))  POCT urinalysis dipstick     Status: Abnormal   Collection Time: 04/12/21  1:41 PM  Result Value Ref Range   Color, UA     Clarity, UA     Glucose, UA Negative Negative   Bilirubin, UA Negative    Ketones, UA Negative    Spec Grav, UA 1.020 1.010 - 1.025   Blood, UA Negative    pH, UA 6.0 5.0 - 8.0   Protein, UA Negative Negative   Urobilinogen, UA negative (A) 0.2 or 1.0 E.U./dL   Nitrite, UA Negative    Leukocytes, UA Negative Negative   Appearance     Odor      Radiology: DG Chest 2 View  Result Date: 03/23/2020 CLINICAL DATA:  Cough. EXAM: CHEST - 2 VIEW COMPARISON:  Chest x-ray 01/27/2020, 12/14/2019, 06/06/2015. FINDINGS: Mediastinum and hilar structures normal. Heart size normal. Interim partial clearing of left base atelectasis/infiltrate. Chronic interstitial changes again noted. No prominent pleural effusion. No pneumothorax. Degenerative change thoracic spine and both shoulders. IMPRESSION: 1.  Interim partial clearing of left base atelectasis/infiltrate. Electronically Signed   By: 06/08/2015  Register   On: 03/23/2020 07:41   CT Head Wo Contrast  Result Date: 03/22/2020 CLINICAL DATA:  Syncope. EXAM: CT HEAD WITHOUT CONTRAST  TECHNIQUE: Contiguous axial images were obtained from the base of the skull through the vertex without intravenous contrast. COMPARISON:  April 30, 2011. FINDINGS: Brain: No evidence of acute infarction, hemorrhage, hydrocephalus, extra-axial collection or mass lesion/mass effect. Vascular: No hyperdense vessel or unexpected calcification. Skull: Normal. Negative for fracture or focal lesion. Sinuses/Orbits: No acute finding. Other: None. IMPRESSION: Normal head CT. Electronically Signed   By: May 02, 2011 M.D.   On: 03/22/2020 16:03    No results found.  No results found.    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 05/09/2018   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/31/2017   Other hyperlipidemia 10/17/2017   Chronic pain syndrome 09/03/2017   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   Lumbar post-laminectomy syndrome 09/03/2017   Chronic pruritus 01/04/2017   Vitamin B12 deficiency 01/01/2017   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 Chronic obstructive pulmonary disease, unspecified COPD type (HCC) She has severe COPD we will do follow-up pulmonary function testing to be  done to evaluate severity and see if there is been any worsening.  As far as her smoking is concerned once again strongly cautioned smoking and strongly encouraged to discontinue  2. Chest pain, unspecified type Atypical chest pain not cardiac at this stage but may need work-up per her primary care - EKG 12-Lead  3. Dyspnea on exertion Multifactorial COPD oxygen dependence as well as ongoing tobacco use - 6 minute walk; Future  4. SOB (shortness of breath)  - 6 minute walk; Future - Pulse oximetry, overnight; Future - Pulmonary function test; Future  5. Cigarette nicotine dependence with nicotine-induced disorder Smoking cessation counseling provided methods to quit smoking discussed at length  General Counseling: I have discussed the findings of the evaluation and examination with Talbert Forest.  I have also discussed any further diagnostic evaluation thatmay be needed or ordered today. Beula verbalizes understanding of the findings of todays visit. We also reviewed her medications today and discussed drug interactions and side effects including but not limited excessive drowsiness and altered mental states. We also discussed that there is always a risk not just to her but also people around her. she has been encouraged to call the office with any questions or concerns that should arise related to todays visit.  Orders Placed This Encounter  Procedures   EKG 12-Lead   6 minute walk    Standing Status:   Future    Standing Expiration Date:   05/16/2022     Time spent: 22  I have personally obtained a history, examined the patient, evaluated laboratory and imaging results, formulated the assessment and plan and placed orders.    Yevonne Pax, MD [facility] Pulmonary and Critical Care Sleep medicine

## 2021-05-17 ENCOUNTER — Ambulatory Visit: Admitting: Internal Medicine

## 2021-05-23 ENCOUNTER — Ambulatory Visit: Admitting: Nurse Practitioner

## 2021-05-27 ENCOUNTER — Telehealth: Payer: Self-pay

## 2021-05-27 NOTE — Telephone Encounter (Signed)
Pt was set up on 05/17/21 to do overnight oximetry and per Sarah from [facility], pt had device for a week but never wore it.

## 2021-05-30 ENCOUNTER — Telehealth: Payer: Self-pay

## 2021-05-30 NOTE — Telephone Encounter (Signed)
Left vm to confirm 06/01/21 ultrasound-Toni 

## 2021-06-01 ENCOUNTER — Ambulatory Visit: Admitting: Internal Medicine

## 2021-06-03 ENCOUNTER — Telehealth: Payer: Self-pay

## 2021-06-03 NOTE — Telephone Encounter (Signed)
Patient mailed discharge letter for non compliancy. Copy of letter placed in scan.

## 2021-06-16 ENCOUNTER — Ambulatory Visit: Admitting: Internal Medicine

## 2021-06-21 ENCOUNTER — Emergency Department: Payer: Medicare Other

## 2021-06-21 ENCOUNTER — Inpatient Hospital Stay
Admission: EM | Admit: 2021-06-21 | Discharge: 2021-06-23 | DRG: 190 | Disposition: A | Discharge status: Home / Self Care | Payer: Medicare Other | Attending: Student | Admitting: Student

## 2021-06-21 ENCOUNTER — Other Ambulatory Visit: Payer: Self-pay

## 2021-06-21 DIAGNOSIS — J9621 Acute and chronic respiratory failure with hypoxia: Secondary | ICD-10-CM | POA: Diagnosis present

## 2021-06-21 DIAGNOSIS — K219 Gastro-esophageal reflux disease without esophagitis: Secondary | ICD-10-CM | POA: Diagnosis present

## 2021-06-21 DIAGNOSIS — F329 Major depressive disorder, single episode, unspecified: Secondary | ICD-10-CM | POA: Diagnosis present

## 2021-06-21 DIAGNOSIS — R0603 Acute respiratory distress: Secondary | ICD-10-CM | POA: Diagnosis not present

## 2021-06-21 DIAGNOSIS — Z9981 Dependence on supplemental oxygen: Secondary | ICD-10-CM | POA: Diagnosis not present

## 2021-06-21 DIAGNOSIS — I959 Hypotension, unspecified: Secondary | ICD-10-CM | POA: Diagnosis present

## 2021-06-21 DIAGNOSIS — F17200 Nicotine dependence, unspecified, uncomplicated: Secondary | ICD-10-CM | POA: Diagnosis not present

## 2021-06-21 DIAGNOSIS — E669 Obesity, unspecified: Secondary | ICD-10-CM | POA: Diagnosis present

## 2021-06-21 DIAGNOSIS — R0902 Hypoxemia: Secondary | ICD-10-CM

## 2021-06-21 DIAGNOSIS — J441 Chronic obstructive pulmonary disease with (acute) exacerbation: Secondary | ICD-10-CM | POA: Diagnosis not present

## 2021-06-21 DIAGNOSIS — Z7951 Long term (current) use of inhaled steroids: Secondary | ICD-10-CM | POA: Diagnosis not present

## 2021-06-21 DIAGNOSIS — F17210 Nicotine dependence, cigarettes, uncomplicated: Secondary | ICD-10-CM | POA: Diagnosis present

## 2021-06-21 DIAGNOSIS — G894 Chronic pain syndrome: Secondary | ICD-10-CM | POA: Diagnosis present

## 2021-06-21 DIAGNOSIS — I110 Hypertensive heart disease with heart failure: Secondary | ICD-10-CM | POA: Diagnosis present

## 2021-06-21 DIAGNOSIS — F32A Depression, unspecified: Secondary | ICD-10-CM | POA: Diagnosis not present

## 2021-06-21 DIAGNOSIS — E785 Hyperlipidemia, unspecified: Secondary | ICD-10-CM | POA: Diagnosis present

## 2021-06-21 DIAGNOSIS — J9601 Acute respiratory failure with hypoxia: Secondary | ICD-10-CM | POA: Diagnosis present

## 2021-06-21 DIAGNOSIS — Z79891 Long term (current) use of opiate analgesic: Secondary | ICD-10-CM

## 2021-06-21 DIAGNOSIS — I10 Essential (primary) hypertension: Secondary | ICD-10-CM | POA: Diagnosis present

## 2021-06-21 DIAGNOSIS — Z79899 Other long term (current) drug therapy: Secondary | ICD-10-CM | POA: Diagnosis not present

## 2021-06-21 DIAGNOSIS — F419 Anxiety disorder, unspecified: Secondary | ICD-10-CM | POA: Diagnosis not present

## 2021-06-21 DIAGNOSIS — I5032 Chronic diastolic (congestive) heart failure: Secondary | ICD-10-CM | POA: Diagnosis present

## 2021-06-21 DIAGNOSIS — Z6831 Body mass index (BMI) 31.0-31.9, adult: Secondary | ICD-10-CM | POA: Diagnosis not present

## 2021-06-21 DIAGNOSIS — Z8673 Personal history of transient ischemic attack (TIA), and cerebral infarction without residual deficits: Secondary | ICD-10-CM | POA: Diagnosis not present

## 2021-06-21 DIAGNOSIS — J449 Chronic obstructive pulmonary disease, unspecified: Secondary | ICD-10-CM | POA: Diagnosis present

## 2021-06-21 DIAGNOSIS — Z720 Tobacco use: Secondary | ICD-10-CM | POA: Diagnosis not present

## 2021-06-21 DIAGNOSIS — Z20822 Contact with and (suspected) exposure to covid-19: Secondary | ICD-10-CM | POA: Diagnosis present

## 2021-06-21 DIAGNOSIS — M1990 Unspecified osteoarthritis, unspecified site: Secondary | ICD-10-CM | POA: Diagnosis present

## 2021-06-21 LAB — BLOOD GAS, ARTERIAL
Acid-Base Excess: 2.7 mmol/L — ABNORMAL HIGH (ref 0.0–2.0)
Allens test (pass/fail): POSITIVE — AB
Bicarbonate: 27.9 mmol/L (ref 20.0–28.0)
Delivery systems: POSITIVE
Expiratory PAP: 5
FIO2: 0.5
Inspiratory PAP: 12
O2 Saturation: 99.2 %
Patient temperature: 37
RATE: 8 resp/min
pCO2 arterial: 44 mmHg (ref 32.0–48.0)
pH, Arterial: 7.41 (ref 7.350–7.450)
pO2, Arterial: 140 mmHg — ABNORMAL HIGH (ref 83.0–108.0)

## 2021-06-21 LAB — COMPREHENSIVE METABOLIC PANEL
ALT: 15 U/L (ref 0–44)
AST: 14 U/L — ABNORMAL LOW (ref 15–41)
Albumin: 3.9 g/dL (ref 3.5–5.0)
Alkaline Phosphatase: 80 U/L (ref 38–126)
Anion gap: 7 (ref 5–15)
BUN: 13 mg/dL (ref 8–23)
CO2: 29 mmol/L (ref 22–32)
Calcium: 9.1 mg/dL (ref 8.9–10.3)
Chloride: 104 mmol/L (ref 98–111)
Creatinine, Ser: 0.58 mg/dL (ref 0.44–1.00)
GFR, Estimated: >60 mL/min (ref >60)
Glucose, Bld: 120 mg/dL — ABNORMAL HIGH (ref 70–99)
Potassium: 3.9 mmol/L (ref 3.5–5.1)
Sodium: 140 mmol/L (ref 135–145)
Total Bilirubin: 0.9 mg/dL (ref 0.3–1.2)
Total Protein: 6.6 g/dL (ref 6.5–8.1)

## 2021-06-21 LAB — CBC WITH DIFFERENTIAL/PLATELET
Abs Immature Granulocytes: 0.03 10*3/uL (ref 0.00–0.07)
Basophils Absolute: 0.1 10*3/uL (ref 0.0–0.1)
Basophils Relative: 1 %
Eosinophils Absolute: 0.3 10*3/uL (ref 0.0–0.5)
Eosinophils Relative: 4 %
HCT: 39.7 % (ref 36.0–46.0)
Hemoglobin: 13.9 g/dL (ref 12.0–15.0)
Immature Granulocytes: 0 %
Lymphocytes Relative: 36 %
Lymphs Abs: 3.1 10*3/uL (ref 0.7–4.0)
MCH: 30.8 pg (ref 26.0–34.0)
MCHC: 35 g/dL (ref 30.0–36.0)
MCV: 88 fL (ref 80.0–100.0)
Monocytes Absolute: 0.5 10*3/uL (ref 0.1–1.0)
Monocytes Relative: 5 %
Neutro Abs: 4.5 10*3/uL (ref 1.7–7.7)
Neutrophils Relative %: 54 %
Platelets: 176 10*3/uL (ref 150–400)
RBC: 4.51 MIL/uL (ref 3.87–5.11)
RDW: 12.4 % (ref 11.5–15.5)
WBC: 8.5 10*3/uL (ref 4.0–10.5)
nRBC: 0 % (ref 0.0–0.2)

## 2021-06-21 LAB — RESP PANEL BY RT-PCR (FLU A&B, COVID) ARPGX2
Influenza A by PCR: NEGATIVE
Influenza B by PCR: NEGATIVE
SARS Coronavirus 2 by RT PCR: NEGATIVE

## 2021-06-21 LAB — BRAIN NATRIURETIC PEPTIDE: B Natriuretic Peptide: 72.6 pg/mL (ref 0.0–100.0)

## 2021-06-21 LAB — TROPONIN I (HIGH SENSITIVITY)
Troponin I (High Sensitivity): 4 ng/L (ref <18)
Troponin I (High Sensitivity): 3 ng/L (ref <18)
Troponin I (High Sensitivity): 3 ng/L (ref <18)
Troponin I (High Sensitivity): 3 ng/L (ref <18)

## 2021-06-21 LAB — MAGNESIUM: Magnesium: 2.3 mg/dL (ref 1.7–2.4)

## 2021-06-21 LAB — LACTIC ACID, PLASMA: Lactic Acid, Venous: 1 mmol/L (ref 0.5–1.9)

## 2021-06-21 MED ORDER — METHYLPREDNISOLONE SODIUM SUCC 40 MG IJ SOLR
40.0000 mg | Freq: Two times a day (BID) | INTRAMUSCULAR | Status: DC
  Administered 2021-06-21 – 2021-06-23 (×4): 40 mg via INTRAVENOUS
  Filled 2021-06-21 (×4): qty 1

## 2021-06-21 MED ORDER — FENTANYL CITRATE PF 50 MCG/ML IJ SOSY
25.0000 ug | PREFILLED_SYRINGE | Freq: Once | INTRAMUSCULAR | Status: AC
Start: 2021-06-21 — End: 2021-06-21
  Administered 2021-06-21: 25 ug via INTRAVENOUS
  Filled 2021-06-21: qty 1

## 2021-06-21 MED ORDER — TRAZODONE HCL 50 MG PO TABS
25.0000 mg | ORAL_TABLET | Freq: Every evening | ORAL | Status: DC | PRN
  Administered 2021-06-21: 50 mg via ORAL
  Filled 2021-06-21 (×2): qty 1

## 2021-06-21 MED ORDER — OXYCODONE HCL 5 MG PO TABS
5.0000 mg | ORAL_TABLET | Freq: Four times a day (QID) | ORAL | Status: DC | PRN
  Administered 2021-06-21 – 2021-06-23 (×6): 5 mg via ORAL
  Filled 2021-06-21 (×6): qty 1

## 2021-06-21 MED ORDER — SODIUM CHLORIDE 0.9 % IV BOLUS
1000.0000 mL | Freq: Once | INTRAVENOUS | Status: AC
  Administered 2021-06-21: 1000 mL via INTRAVENOUS

## 2021-06-21 MED ORDER — ENOXAPARIN SODIUM 40 MG/0.4ML IJ SOSY
40.0000 mg | PREFILLED_SYRINGE | INTRAMUSCULAR | Status: DC
  Administered 2021-06-21 – 2021-06-22 (×2): 40 mg via SUBCUTANEOUS
  Filled 2021-06-21 (×2): qty 0.4

## 2021-06-21 MED ORDER — HYDRALAZINE HCL 20 MG/ML IJ SOLN: 5.0000 mg | INTRAMUSCULAR | Status: DC | PRN

## 2021-06-21 MED ORDER — ALBUTEROL SULFATE (2.5 MG/3ML) 0.083% IN NEBU: 2.5000 mg | INHALATION_SOLUTION | RESPIRATORY_TRACT | Status: DC | PRN

## 2021-06-21 MED ORDER — METHYLPREDNISOLONE SODIUM SUCC 125 MG IJ SOLR
125.0000 mg | Freq: Once | INTRAMUSCULAR | Status: AC
  Administered 2021-06-21: 125 mg via INTRAVENOUS
  Filled 2021-06-21: qty 2

## 2021-06-21 MED ORDER — ONDANSETRON HCL 4 MG/2ML IJ SOLN
4.0000 mg | Freq: Once | INTRAMUSCULAR | Status: AC
  Administered 2021-06-21: 4 mg via INTRAVENOUS
  Filled 2021-06-21: qty 2

## 2021-06-21 MED ORDER — PANTOPRAZOLE SODIUM 40 MG PO TBEC
40.0000 mg | DELAYED_RELEASE_TABLET | Freq: Every day | ORAL | Status: DC
  Administered 2021-06-21 – 2021-06-23 (×3): 40 mg via ORAL
  Filled 2021-06-21 (×3): qty 1

## 2021-06-21 MED ORDER — ARIPIPRAZOLE 2 MG PO TABS
2.0000 mg | ORAL_TABLET | Freq: Every day | ORAL | Status: DC
Start: 2021-06-21 — End: 2021-06-23
  Administered 2021-06-21 – 2021-06-23 (×3): 2 mg via ORAL
  Filled 2021-06-21 (×3): qty 1

## 2021-06-21 MED ORDER — SODIUM CHLORIDE 0.9 % IV SOLN: INTRAVENOUS | Status: DC

## 2021-06-21 MED ORDER — DM-GUAIFENESIN ER 30-600 MG PO TB12
1.0000 | ORAL_TABLET | Freq: Two times a day (BID) | ORAL | Status: DC | PRN
  Administered 2021-06-21 – 2021-06-22 (×2): 1 via ORAL
  Filled 2021-06-21 (×2): qty 1

## 2021-06-21 MED ORDER — ONDANSETRON HCL 4 MG/2ML IJ SOLN: 4.0000 mg | Freq: Three times a day (TID) | INTRAMUSCULAR | Status: DC | PRN

## 2021-06-21 MED ORDER — MAGNESIUM SULFATE 2 GM/50ML IV SOLN
2.0000 g | Freq: Once | INTRAVENOUS | Status: AC
  Administered 2021-06-21: 2 g via INTRAVENOUS
  Filled 2021-06-21: qty 50

## 2021-06-21 MED ORDER — CITALOPRAM HYDROBROMIDE 20 MG PO TABS
40.0000 mg | ORAL_TABLET | Freq: Every day | ORAL | Status: DC
  Administered 2021-06-21 – 2021-06-23 (×3): 40 mg via ORAL
  Filled 2021-06-21 (×3): qty 2

## 2021-06-21 MED ORDER — OXYCODONE HCL 5 MG PO TABS
5.0000 mg | ORAL_TABLET | Freq: Two times a day (BID) | ORAL | Status: DC | PRN
Start: 2021-06-21 — End: 2021-06-21

## 2021-06-21 MED ORDER — AZITHROMYCIN 500 MG PO TABS
500.0000 mg | ORAL_TABLET | Freq: Every day | ORAL | Status: AC
  Administered 2021-06-21: 500 mg via ORAL
  Filled 2021-06-21: qty 1

## 2021-06-21 MED ORDER — OXYCODONE HCL 5 MG PO TABS
5.0000 mg | ORAL_TABLET | Freq: Once | ORAL | Status: AC
  Administered 2021-06-21: 5 mg via ORAL
  Filled 2021-06-21: qty 1

## 2021-06-21 MED ORDER — AZITHROMYCIN 250 MG PO TABS
250.0000 mg | ORAL_TABLET | Freq: Every day | ORAL | Status: DC
  Administered 2021-06-22 – 2021-06-23 (×2): 250 mg via ORAL
  Filled 2021-06-21 (×2): qty 1

## 2021-06-21 MED ORDER — IPRATROPIUM-ALBUTEROL 0.5-2.5 (3) MG/3ML IN SOLN
3.0000 mL | Freq: Once | RESPIRATORY_TRACT | Status: AC
  Administered 2021-06-21: 3 mL via RESPIRATORY_TRACT
  Filled 2021-06-21: qty 3

## 2021-06-21 MED ORDER — IPRATROPIUM-ALBUTEROL 0.5-2.5 (3) MG/3ML IN SOLN
3.0000 mL | RESPIRATORY_TRACT | Status: DC
  Administered 2021-06-21 – 2021-06-23 (×12): 3 mL via RESPIRATORY_TRACT
  Filled 2021-06-21 (×14): qty 3

## 2021-06-21 MED ORDER — ACETAMINOPHEN 325 MG PO TABS
650.0000 mg | ORAL_TABLET | Freq: Four times a day (QID) | ORAL | Status: DC | PRN
  Administered 2021-06-21: 650 mg via ORAL
  Filled 2021-06-21: qty 2

## 2021-06-21 MED ORDER — ATORVASTATIN CALCIUM 20 MG PO TABS
40.0000 mg | ORAL_TABLET | Freq: Every day | ORAL | Status: DC
  Administered 2021-06-21 – 2021-06-23 (×3): 40 mg via ORAL
  Filled 2021-06-21 (×3): qty 2

## 2021-06-21 MED ORDER — NICOTINE 21 MG/24HR TD PT24
21.0000 mg | MEDICATED_PATCH | Freq: Every day | TRANSDERMAL | Status: DC
Start: 2021-06-21 — End: 2021-06-23
  Administered 2021-06-21 – 2021-06-23 (×3): 21 mg via TRANSDERMAL
  Filled 2021-06-21 (×3): qty 1

## 2021-06-21 NOTE — ED Provider Notes (Signed)
[facility] Emergency Department Provider Note   ____________________________________________   Event Date/Time   First MD Initiated Contact with Patient 06/21/21 [phone]     (approximate)  I have reviewed the triage vital signs and the nursing notes.   HISTORY  Chief Complaint Respiratory distress    HPI Jennifer Hayes is a 80 y.o. female brought to the ED via EMS from home with a chief complaint of respiratory distress.  Patient with a history of COPD and CHF on continuous 3-4 L nasal cannula oxygen.  Reports progressive shortness of breath x1 day.  Had increased her oxygen to 8 L prior to EMS arrival.  Endorses cough and chest tightness.  Denies fever, abdominal pain, nausea, vomiting or dizziness.  Patient given 2 albuterol nebulizer treatments and 2 DuoNeb's prior to arrival.     Past Medical History:  Diagnosis Date   Arthritis    CHF (congestive heart failure) (HCC)    COPD (chronic obstructive pulmonary disease) (HCC)    Depression    GERD (gastroesophageal reflux disease)    Stroke [facility])     Patient Active Problem List   Diagnosis Date Noted   Depression, recurrent (HCC) 03/20/2021   Memory loss of unknown cause 07/15/2020   MDD (major depressive disorder) 06/02/2019   Cigarette nicotine dependence without complication 04/07/2019   Major depressive disorder in full remission (HCC) 04/07/2019   HTN (hypertension) 12/26/2018   UTI (urinary tract infection) 11/04/2018   Chronic bilateral low back pain 05/09/2018   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 05/09/2018   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/31/2017   Other hyperlipidemia 10/17/2017   Chronic pain syndrome 09/03/2017   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   Lumbar post-laminectomy syndrome 09/03/2017   Chronic pruritus 01/04/2017   Vitamin B12 deficiency 01/01/2017   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 Authorizing Provider  amLODipine (NORVASC) 2.5 MG tablet Take 1 tablet (2.5 mg total) by mouth daily. 04/12/21   Sallyanne Kuster, NP  ARIPiprazole (ABILIFY) 2 MG tablet Take 1 tablet (2 mg total) by mouth daily. 04/12/21   Sallyanne Kuster, NP  atorvastatin (LIPITOR) 40 MG tablet Take 1 tablet (40 mg total) by mouth daily. 03/16/21   Sallyanne Kuster, NP  citalopram (CELEXA) 40 MG tablet Take 1 tablet (40 mg total) by mouth daily. 03/16/21 03/16/22  Sallyanne Kuster, NP  esomeprazole (NEXIUM) 40 MG capsule Take 1 capsule (40 mg total) by mouth daily. 12/21/20   Theotis Burrow, NP  Fluticasone-Umeclidin-Vilant (TRELEGY ELLIPTA) 100-62.5-25 MCG/INH AEPB Inhale 1 puff into the lungs daily. 04/12/21   Sallyanne Kuster, NP  Ipratropium-Albuterol (COMBIVENT) 20-100 MCG/ACT AERS respimat Inhale into the lungs. 06/07/18   [provider]  ipratropium-albuterol (DUONEB) 0.5-2.5 (3) MG/3ML SOLN Take 3 mLs by nebulization 2 (two) times a day as needed. 11/12/19   Johnna Acosta, NP  oxycodone (OXY-IR) 5 MG capsule Take by mouth.     [provider]  traZODone (DESYREL)  50 MG tablet Take 0.5-1 tablets (25-50 mg total) by mouth at bedtime as needed for sleep. 04/12/21   Sallyanne Kuster, NP    Allergies Bupropion and Penicillins  Family History  Family history unknown: Yes    Social History Social History   Tobacco Use   Smoking status: Every Day    Packs/day: 0.50    Types: Cigarettes   Smokeless tobacco:  Never  Vaping Use   Vaping Use: Some days  Substance Use Topics   Alcohol use: Not Currently    Comment: occasionally   Drug use: Never    Review of Systems  Constitutional: No fever/chills Eyes: No visual changes. ENT: No sore throat. Cardiovascular: Positive for chest pain. Respiratory: Positive for cough and shortness of breath. Gastrointestinal: No abdominal pain.  No nausea, no vomiting.  No diarrhea.  No constipation. Genitourinary: Negative for dysuria. Musculoskeletal: Negative for back pain. Skin: Negative for rash. Neurological: Negative for headaches, focal weakness or numbness.   ____________________________________________   PHYSICAL EXAM:  VITAL SIGNS: ED Triage Vitals  Enc Vitals Group     BP      Pulse      Resp      Temp      Temp src      SpO2      Weight      Height      Head Circumference      Peak Flow      Pain Score      Pain Loc      Pain Edu?      Excl. in GC?     Constitutional: Alert and oriented.  Elderly appearing and in moderate acute distress. Eyes: Conjunctivae are normal. PERRL. EOMI. Head: Atraumatic. Nose: No congestion/rhinnorhea. Mouth/Throat: Mucous membranes are moist.   Neck: No stridor.   Cardiovascular: Normal rate, regular rhythm. Grossly normal heart sounds.  Good peripheral circulation. Respiratory: Increased respiratory effort.  Tripoding.  Retractions. Lungs with audible wheezing and rales. Gastrointestinal: Soft and nontender. No distention. No abdominal bruits. No CVA tenderness. Musculoskeletal: No lower extremity tenderness nor edema.  No joint effusions. Neurologic:  Normal speech and language. No gross focal neurologic deficits are appreciated. . Skin:  Skin is warm, dry and intact. No rash noted. Psychiatric: Mood and affect are normal. Speech and behavior are normal.  ____________________________________________   LABS (all labs ordered are listed, but only abnormal results are displayed)  Labs  Reviewed  RESP PANEL BY RT-PCR (FLU A&B, COVID) ARPGX2  CBC WITH DIFFERENTIAL/PLATELET  COMPREHENSIVE METABOLIC PANEL  BRAIN NATRIURETIC PEPTIDE  LACTIC ACID, PLASMA  LACTIC ACID, PLASMA  BLOOD GAS, ARTERIAL  MAGNESIUM  TROPONIN I (HIGH SENSITIVITY)   ____________________________________________  EKG  ED ECG REPORT I, Ahmaad Neidhardt J, the attending physician, personally viewed and interpreted this ECG.   Date: 06/21/2021  EKG Time: 0650  Rate: 93  Rhythm: normal sinus rhythm  Axis: Normal  Intervals:none  ST&T Change: Nonspecific  ____________________________________________  RADIOLOGY I, Kaymarie Wynn J, personally viewed and evaluated these images (plain radiographs) as part of my medical decision making, as well as reviewing the written report by the radiologist.  ED MD interpretation: Pending  Official radiology report(s): No results found.  ____________________________________________   PROCEDURES  Procedure(s) performed (including Critical Care):  .1-3 Lead EKG Interpretation Performed by: Irean Hong, MD Authorized by: Irean Hong, MD     Interpretation: normal     ECG rate:  77   ECG rate assessment: normal  Rhythm: sinus rhythm     Ectopy: none     Conduction: normal   Comments:     Patient placed on cardiac monitor to evaluate for arrhythmias   CRITICAL CARE Performed by: Irean Hong   Total critical care time: 20 minutes  Critical care time was exclusive of separately billable procedures and treating other patients.  Critical care was necessary to treat or prevent imminent or life-threatening deterioration.  Critical care was time spent personally by me on the following activities: development of treatment plan with patient and/or surrogate as well as nursing, discussions with consultants, evaluation of patient's response to treatment, examination of patient, obtaining history from patient or surrogate, ordering and performing treatments and  interventions, ordering and review of laboratory studies, ordering and review of radiographic studies, pulse oximetry and re-evaluation of patient's condition.    ____________________________________________   INITIAL IMPRESSION / ASSESSMENT AND PLAN / ED COURSE  As part of my medical decision making, I reviewed the following data within the electronic MEDICAL RECORD NUMBER Nursing notes reviewed and incorporated, Labs reviewed, EKG interpreted, Old chart reviewed, Patient signed out to, Radiograph reviewed, and Notes from prior ED visits     80 year old female presenting in respiratory distress. Differential includes, but is not limited to, viral syndrome, bronchitis including COPD exacerbation, pneumonia, reactive airway disease including asthma, CHF including exacerbation with or without pulmonary/interstitial edema, pneumothorax, ACS, thoracic trauma, and pulmonary embolism.   Will obtain cardiac panel, chest x-ray.  Administer IV Solu-Medrol, DuoNeb, magnesium.  Placed on BiPAP.  Anticipate hospitalization.  Clinical Course as of 06/21/21 0655  Tue Jun 21, 2021  0654 Care transferred to Dr. Fuller Plan at shift change pending lab work, chest x-ray and admission. [JS]    Clinical Course User Index [JS] Irean Hong, MD     ____________________________________________   FINAL CLINICAL IMPRESSION(S) / ED DIAGNOSES  Final diagnoses:  Respiratory distress  COPD with acute exacerbation (HCC)  Hypoxia     ED Discharge Orders     None        Note:  This document was prepared using Dragon voice recognition software and may include unintentional dictation errors.    Irean Hong, MD 06/21/21 [phone]

## 2021-06-21 NOTE — ED Notes (Signed)
Patient tearful and not wanting to keep Bi-Pap on. Patient educated on the importance of keeping it on. Patient complaining of headache. Fuller Plan, MD to bedside and aware of same. Attempted to call patient's daughter-in-law as requested by patient with no answer. Patient willing to keep Bi-Pap on at this time.

## 2021-06-21 NOTE — ED Notes (Signed)
Pt requested morning dose of oxycodone that she normally takes at home. Niu MD made aware.

## 2021-06-21 NOTE — ED Notes (Signed)
Patient placed on purewick at this time. 

## 2021-06-21 NOTE — ED Notes (Signed)
Patient complaining of chest pressure after coughing frequently. EKG performed. Message sent to Clyde Lundborg, MD with same.

## 2021-06-21 NOTE — ED Notes (Addendum)
Niu MD made aware of pts blood pressure

## 2021-06-21 NOTE — ED Notes (Signed)
Niu, MD at bedside. °

## 2021-06-21 NOTE — ED Notes (Signed)
Patient eating lunch tray at this time °

## 2021-06-21 NOTE — Plan of Care (Signed)

## 2021-06-21 NOTE — ED Provider Notes (Signed)
7:34 AM Assumed care for off going team.   Blood pressure 136/72, pulse 86, temperature 98.3 F (36.8 C), temperature source Oral, resp. rate (!) 22, height 5\' 4"  (1.626 m), weight 74.8 kg, SpO2 97 %.  See their HPI for full report but in brief  Labs- admit--- most likely copd flare- COPD CHF 3-4L at home. SOB one day. She moved her oxygen to 8L and was 92%. Two duonebs/2 albuterol-- on bipap. Solumedral. Duoneb.   7:34 AM patient is wanting to take the BiPAP mask off.  I explained to patient that we should probably wait until we get the rest of her labs back.  Patient is willing to wait but is requesting something for her headache.  We will give a small dose of fentanyl  8:09 AM updated family at bedside.  Patient continued to look very comfortable on the BiPAP.  They are okay with leaving the BiPAP on for now.  Reviewed labs no evidence of heart attack or fluid.  Suspect this is mostly just her COPD.  We will discussed with the hospital team for admission for COPD exacerbation.    , MD 06/21/21 [phone]

## 2021-06-21 NOTE — ED Notes (Signed)
Pt transitioned off of bipap to 4L nasal cannula. O2 sats 97%

## 2021-06-21 NOTE — H&P (Addendum)
History and Physical    Jennifer Hayes [facility] GUR:[id number] DOB: 31-May-1941 DOA: 06/21/2021  Referring MD/NP/PA:   PCP: Pcp, No   Patient coming from:  The patient is coming from home.  At baseline, pt is independent for most of ADL.        Chief Complaint: SOB  HPI: Jennifer Hayes is a 80 y.o. female with medical history significant of dCHF, COPD on 3-4 L oxygen at home, hypertension, hyperlipidemia, TIA, GERD, depression, chronic pain syndrome, tobacco abuse, memory loss, UTI, who presents with shortness breath.  Patient states that her shortness breath started yesterday, which has been progressively worsening.  Patient has dry cough and chest tightness.  No active chest pain.  No fever or chills.  Patient denies nausea vomiting, diarrhea or abdominal pain.  No symptoms of UTI.  Patient continues to smoke.  Patient was found to have severe respiratory distress, using accessory of muscles for breathing.  Oxygen was initially increased to 8 L, but still has severe respiratory distress.  BiPAP started in ED.  ED Course: pt was found to have WBC 8.  5, negative COVID PCR, troponin level 4, lactic acid 1.0, electrolytes renal function okay, temperature normal, blood pressure 128/71, heart rate 86, RR 22, chest x-ray showed chronic interstitial coarsening and hyperexpansion, no infiltration.  Patient is admitted to progressive bed as inpatient.  Review of Systems:   General: no fevers, chills, no body weight gain, has fatigue HEENT: no blurry vision, hearing changes or sore throat Respiratory: has dyspnea, coughing, no wheezing CV: no chest pain, no palpitations GI: no nausea, vomiting, abdominal pain, diarrhea, constipation GU: no dysuria, burning on urination, increased urinary frequency, hematuria  Ext: has trace leg edema Neuro: no unilateral weakness, numbness, or tingling, no vision change or hearing loss Skin: no rash, no skin tear. MSK: No muscle spasm, no  deformity, no limitation of range of movement in spin Heme: No easy bruising.  Travel history: No recent long distant travel.  Allergy:  Allergies  Allergen Reactions   Bupropion Swelling    headache   Penicillins Hives    Past Medical History:  Diagnosis Date   Arthritis    CHF (congestive heart failure) (HCC)    COPD (chronic obstructive pulmonary disease) (HCC)    Depression    GERD (gastroesophageal reflux disease)    Stroke [facility])     Past Surgical History:  Procedure Laterality Date   APPENDECTOMY     BACK SURGERY      Social History:  reports that she has been smoking cigarettes. She has been smoking an average of .5 packs per day. She has never used smokeless tobacco. She reports that she does not currently use alcohol. She reports that she does not use drugs.  Family History: I have reviewed with the patient about family medical history, but she states that all family members do not have significant medical history.   Family History  Family history unknown: Yes     Prior to Admission medications   Medication Sig Start Date End Date Taking? Authorizing Provider  amLODipine (NORVASC) 2.5 MG tablet Take 1 tablet (2.5 mg total) by mouth daily. 04/12/21   Sallyanne Kuster, NP  ARIPiprazole (ABILIFY) 2 MG tablet Take 1 tablet (2 mg total) by mouth daily. 04/12/21   Sallyanne Kuster, NP  atorvastatin (LIPITOR) 40 MG tablet Take 1 tablet (40 mg total) by mouth daily. 03/16/21   Sallyanne Kuster, NP  citalopram (CELEXA) 40 MG tablet Take 1  tablet (40 mg total) by mouth daily. 03/16/21 03/16/22  Sallyanne Kuster, NP  esomeprazole (NEXIUM) 40 MG capsule Take 1 capsule (40 mg total) by mouth daily. 12/21/20   Theotis Burrow, NP  Fluticasone-Umeclidin-Vilant (TRELEGY ELLIPTA) 100-62.5-25 MCG/INH AEPB Inhale 1 puff into the lungs daily. 04/12/21   Sallyanne Kuster, NP  Ipratropium-Albuterol (COMBIVENT) 20-100 MCG/ACT AERS respimat Inhale into the lungs. 06/07/18   [provider]  ipratropium-albuterol (DUONEB) 0.5-2.5 (3) MG/3ML SOLN Take 3 mLs by nebulization 2 (two) times a day as needed. 11/12/19   Johnna Acosta, NP  oxycodone (OXY-IR) 5 MG capsule Take by mouth.     [provider]  traZODone (DESYREL) 50 MG tablet Take 0.5-1 tablets (25-50 mg total) by mouth at bedtime as needed for sleep. 04/12/21   Sallyanne Kuster, NP    Physical Exam: Vitals:   06/21/21 0700 06/21/21 0709 06/21/21 0730 06/21/21 0800  BP: 136/72  133/72 128/77  Pulse: 86  82 76  Resp: (!) 22  14 12   Temp:      TempSrc:      SpO2: 99% 97% 96% 98%  Weight:      Height:       General: In acute respiratory distress HEENT:       Eyes: PERRL, EOMI, no scleral icterus.       ENT: No discharge from the ears and nose, no pharynx injection, no tonsillar enlargement.        Neck: No JVD, no bruit, no mass felt. Heme: No neck lymph node enlargement. Cardiac: S1/S2, RRR, No murmurs, No gallops or rubs. Respiratory: Diffused rhonchi bilaterally GI: Soft, nondistended, nontender, no rebound pain, no organomegaly, BS present. GU: No hematuria Ext: Has trace leg edema bilaterally. 1+DP/PT pulse bilaterally. Musculoskeletal: No joint deformities, No joint redness or warmth, no limitation of ROM in spin. Skin: No rashes.  Neuro: Alert, oriented X3, cranial nerves II-XII grossly intact, moves all extremities normally.  Psych: Patient is not psychotic, no suicidal or hemocidal ideation.  Labs on Admission: I have personally reviewed following labs and imaging studies  CBC: Recent Labs  Lab 06/21/21 0647  WBC 8.5  NEUTROABS 4.5  HGB 13.9  HCT 39.7  MCV 88.0  PLT 176   Basic Metabolic Panel: Recent Labs  Lab 06/21/21 0647  NA 140  K 3.9  CL 104  CO2 29  GLUCOSE 120*  BUN 13  CREATININE 0.58  CALCIUM 9.1  MG 2.3   GFR: Estimated Creatinine Clearance: 57.4 mL/min (by C-G formula based on SCr of 0.58 mg/dL). Liver Function Tests: Recent Labs  Lab  06/21/21 0647  AST 14*  ALT 15  ALKPHOS 80  BILITOT 0.9  PROT 6.6  ALBUMIN 3.9   No results for input(s): LIPASE, AMYLASE in the last 168 hours. No results for input(s): AMMONIA in the last 168 hours. Coagulation Profile: No results for input(s): INR, PROTIME in the last 168 hours. Cardiac Enzymes: No results for input(s): CKTOTAL, CKMB, CKMBINDEX, TROPONINI in the last 168 hours. BNP (last 3 results) No results for input(s): PROBNP in the last 8760 hours. HbA1C: No results for input(s): HGBA1C in the last 72 hours. CBG: No results for input(s): GLUCAP in the last 168 hours. Lipid Profile: No results for input(s): CHOL, HDL, LDLCALC, TRIG, CHOLHDL, LDLDIRECT in the last 72 hours. Thyroid Function Tests: No results for input(s): TSH, T4TOTAL, FREET4, T3FREE, THYROIDAB in the last 72 hours. Anemia Panel: No results for input(s): VITAMINB12, FOLATE, FERRITIN, TIBC, IRON,  RETICCTPCT in the last 72 hours. Urine analysis:    Component Value Date/Time   COLORURINE Yellow 03/11/2013 0421   APPEARANCEUR Clear 03/11/2013 0421   LABSPEC 1.019 03/11/2013 0421   PHURINE 5.0 03/11/2013 0421   GLUCOSEU Negative 03/11/2013 0421   HGBUR Negative 03/11/2013 0421   BILIRUBINUR Negative 04/12/2021 1341   BILIRUBINUR Negative 03/11/2013 0421   KETONESUR Negative 03/11/2013 0421   PROTEINUR Negative 04/12/2021 1341   PROTEINUR Negative 03/11/2013 0421   UROBILINOGEN negative (A) 04/12/2021 1341   NITRITE Negative 04/12/2021 1341   NITRITE Negative 03/11/2013 0421   LEUKOCYTESUR Negative 04/12/2021 1341   LEUKOCYTESUR Negative 03/11/2013 0421   Sepsis Labs: @LABRCNTIP (procalcitonin:4,lacticidven:4) ) Recent Results (from the past 240 hour(s))  Resp Panel by RT-PCR (Flu A&B, Covid) Nasopharyngeal Swab     Status: None   Collection Time: 06/21/21  6:47 AM   Specimen: Nasopharyngeal Swab; Nasopharyngeal(NP) swabs in vial transport medium  Result Value Ref Range Status   SARS Coronavirus  2 by RT PCR NEGATIVE NEGATIVE Final    Comment: (NOTE) SARS-CoV-2 target nucleic acids are NOT DETECTED.  The SARS-CoV-2 RNA is generally detectable in upper respiratory specimens during the acute phase of infection. The lowest concentration of SARS-CoV-2 viral copies this assay can detect is 138 copies/mL. A negative result does not preclude SARS-Cov-2 infection and should not be used as the sole basis for treatment or other patient management decisions. A negative result may occur with  improper specimen collection/handling, submission of specimen other than nasopharyngeal swab, presence of viral mutation(s) within the areas targeted by this assay, and inadequate number of viral copies(<138 copies/mL). A negative result must be combined with clinical observations, patient history, and epidemiological information. The expected result is Negative.  Fact Sheet for Patients:  06/23/21  Fact Sheet for Healthcare Providers:  BloggerCourse.com  This test is no t yet approved or cleared by the [employer] FDA and  has been authorized for detection and/or diagnosis of SARS-CoV-2 by FDA under an Emergency Use Authorization (EUA). This EUA will remain  in effect (meaning this test can be used) for the duration of the COVID-19 declaration under Section 564(b)(1) of the Act, 21 U.S.C.section 360bbb-3(b)(1), unless the authorization is terminated  or revoked sooner.       Influenza A by PCR NEGATIVE NEGATIVE Final   Influenza B by PCR NEGATIVE NEGATIVE Final    Comment: (NOTE) The Xpert Xpress SARS-CoV-2/FLU/RSV plus assay is intended as an aid in the diagnosis of influenza from Nasopharyngeal swab specimens and should not be used as a sole basis for treatment. Nasal washings and aspirates are unacceptable for Xpert Xpress SARS-CoV-2/FLU/RSV testing.  Fact Sheet for Patients: [place]  Fact  Sheet for Healthcare Providers: BloggerCourse.com  This test is not yet approved or cleared by the [employer] FDA and has been authorized for detection and/or diagnosis of SARS-CoV-2 by FDA under an Emergency Use Authorization (EUA). This EUA will remain in effect (meaning this test can be used) for the duration of the COVID-19 declaration under Section 564(b)(1) of the Act, 21 U.S.C. section 360bbb-3(b)(1), unless the authorization is terminated or revoked.  Performed at [facility], [address] [place], [place]      Radiological Exams on Admission: DG Chest [facility] 1 View  Result Date: 06/21/2021 CLINICAL DATA:  Respiratory distress. EXAM: PORTABLE CHEST 1 VIEW COMPARISON:  03/22/2020 FINDINGS: 0717 hours. The lungs are clear without focal pneumonia, edema, pneumothorax or pleural effusion. The cardiopericardial silhouette is within  normal limits for size. Interstitial markings are diffusely coarsened with chronic features. Skin folds overlie the right lung. Bones are diffusely demineralized. Degenerative changes noted in both shoulders. Telemetry leads overlie the chest. IMPRESSION: Hyperexpansion with chronic interstitial coarsening. No acute cardiopulmonary findings. Electronically Signed   By: Kennith Center M.D.   On: 06/21/2021 07:41     EKG: I have personally reviewed.  Sinus rhythm, QTC 450, low voltage, nonspecific to change  Assessment/Plan Principal Problem:   COPD exacerbation (HCC) Active Problems:   Chronic pain syndrome   History of TIA (transient ischemic attack)   HTN (hypertension)   MDD (major depressive disorder)   HLD (hyperlipidemia)   Acute on chronic respiratory failure with hypoxia (HCC)   Chronic diastolic CHF (congestive heart failure) (HCC)   Tobacco abuse  Acute on chronic respiratory failure with hypoxia due to COPD exacerbation [facility]): Patient has cough, shortness of breath, currently on BiPAP.  Chest  x-ray negative for infiltration.  Patient has diffuse rhonchi on auscultation, clinically consistent with COPD exacerbation.  Patient continues to smoke.  -will admit to progressive unit as inpatient -Bronchodilators -Patient received 2 g of magnesium sulfate in ED -Solu-Medrol 40 mg IV bid -Z pak  -Mucinex for cough  -Incentive spirometry -Follow up sputum culture -Nasal cannula oxygen as needed to maintain O2 saturation 93% or greater when pt is off BiPAP  Chronic pain syndrome -Continue home as needed oxycodone, 5 mg prn bid  History of TIA (transient ischemic attack) -Lipitor  HTN (hypertension): Patient is not taking amlodipine currently. -IV hydralazine as needed  MDD (major depressive disorder) -Abilify, Celexa  HLD (hyperlipidemia) -Lipitor  Chronic diastolic CHF (congestive heart failure) (HCC): 2D echo on 03/03/2021 showed EF of 63%.  Patient does not have leg edema.  No pulm edema on chest x-ray.  BNP normal 72.  CHF is compensated. -Watch volume status closely  Tobacco abuse -Did counseling about importance of quitting smoking -Nicotine patch  Addendum: Patient had 1 episode of hypotension with blood pressure 84/51, which improved to 113/78 after giving 1 L normal saline bolus.  Etiology is not clear.  Clinically patient does not seem to have sepsis.  May be due to dehydration -IV fluid: 1 L normal saline was given, then 100 cc/h -Monitor blood pressure closely -Follow-up of blood culture    DVT ppx:  SQ Lovenox Code Status: Full code Family Communication:  Yes, patient's daughter-in-law    at bed side Disposition Plan:  Anticipate discharge back to previous environment Consults called: None Admission status and Level of care: Progressive Cardiac:   as inpt     Status is: Inpatient  Remains inpatient appropriate because:Inpatient level of care appropriate due to severity of illness  Dispo: The patient is from: Home              Anticipated d/c is to:  Home              Patient currently is not medically stable to d/c.   Difficult to place patient No          Date of Service 06/21/2021    Lorretta Harp [facility]   If 7PM-7AM, please contact night-coverage [email or web address] 06/21/2021, 8:49 AM

## 2021-06-22 DIAGNOSIS — I10 Essential (primary) hypertension: Secondary | ICD-10-CM

## 2021-06-22 DIAGNOSIS — F419 Anxiety disorder, unspecified: Secondary | ICD-10-CM

## 2021-06-22 DIAGNOSIS — F17200 Nicotine dependence, unspecified, uncomplicated: Secondary | ICD-10-CM

## 2021-06-22 DIAGNOSIS — I959 Hypotension, unspecified: Secondary | ICD-10-CM

## 2021-06-22 DIAGNOSIS — J9621 Acute and chronic respiratory failure with hypoxia: Secondary | ICD-10-CM

## 2021-06-22 DIAGNOSIS — I5032 Chronic diastolic (congestive) heart failure: Secondary | ICD-10-CM

## 2021-06-22 DIAGNOSIS — F32A Depression, unspecified: Secondary | ICD-10-CM

## 2021-06-22 DIAGNOSIS — G894 Chronic pain syndrome: Secondary | ICD-10-CM

## 2021-06-22 DIAGNOSIS — J441 Chronic obstructive pulmonary disease with (acute) exacerbation: Principal | ICD-10-CM

## 2021-06-22 NOTE — Progress Notes (Signed)
PROGRESS NOTE  Jennifer Hayes [facility] TDV:[id number] DOB: 05-11-1941   PCP: Pcp, No  Patient is from: Home.  Lives alone.  Uses cane at baseline.  DOA: 06/21/2021 LOS: 1  Chief complaints:  Shortness of breath, dry cough and chest tightness    Brief Narrative / Interim history: 80 year old F with PMH of COPD/chronic RF on 3 to 4 L by [place], diastolic CHF, HTN, HTN, HLD, TIA, GERD, depression, chronic pain syndrome on opiate and tobacco use disorder presenting with acute dyspnea, dry cough and chest tightness, and admitted for acute on chronic respiratory failure with hypoxia due to COPD exacerbation.  Oxygen initially increased to 8 L without improvement in her respiratory distress requiring BiPAP in ED.  She was slightly tachypneic.  CXR with chronic interstitial coarsening and hyperexpansion.  Basic labs and COVID-19 PCR negative.  Troponin and lactic acid within normal.  Started on systemic steroid, azithromycin and breathing treatments  Subjective: Seen and examined earlier this morning.  No major events overnight of this morning.  No complaints.  Reports improvement in her breathing but not quite back to her baseline.  Still with intermittent dry cough.  She denies chest pain.  Denies GI or UTI symptoms.  Patient's daughter at bedside.  Objective: Vitals:   06/22/21 1024 06/22/21 1115 06/22/21 1121 06/22/21 1607  BP:  (!) 113/49  129/60  Pulse:  84  83  Resp:  18  18  Temp:  98.3 F (36.8 C)  98.2 F (36.8 C)  TempSrc:  Oral  Oral  SpO2: 92% 93% 93% 91%  Weight:      Height:        Intake/Output Summary (Last 24 hours) at 06/22/2021 1630 Last data filed at 06/22/2021 1013 Gross per 24 hour  Intake 1282.28 ml  Output 700 ml  Net 582.28 ml   Filed Weights   06/21/21 0645 06/21/21 1700 06/22/21 0646  Weight: 74.8 kg 77.2 kg 76.7 kg    Examination:  GENERAL: No apparent distress.  Nontoxic. HEENT: MMM.  Vision and hearing grossly intact.  NECK: Supple.  No apparent  JVD.  RESP: 92% on 3 L.  No IWOB.  Rhonchi bilaterally. CVS:  RRR. Heart sounds normal.  ABD/GI/GU: BS+. Abd soft, NTND.  MSK/EXT:  Moves extremities. No apparent deformity. No edema.  SKIN: no apparent skin lesion or wound NEURO: Awake, alert and oriented appropriately.  No apparent focal neuro deficit. PSYCH: Calm. Normal affect.   Procedures:  None  Microbiology summarized: COVID-19 and influenza PCR nonreactive.  Assessment & Plan: Acute on chronic RF with hypoxia due to COPD exacerbation-likely due to ongoing tobacco abuse. -Initially required BiPAP due to significant respiratory distress that did not improve on 8 L by [place] -CXR consistent with COPD without infiltrate or evidence of CHF. -Serial troponin and lactic acid was normal. -Encourage tobacco cessation -Continue IV Solu-Medrol, azithromycin, bronchodilators, antitussive, mucolytic's, IS, OOB and therapy -Wean oxygen as able-minimum oxygen to keep saturation above 88%  Chronic diastolic CHF: TTE in 02/2021 with LVEF of 63%.  Appears euvolemic on exam.  BNP 74.  Does not seem to be on diuretics at home. -Monitor fluid status -Discontinue IV fluid   Chronic pain syndrome-follows by pain clinic.  On oxycodone chronically. -Continue oxycodone as needed   History of TIA (transient ischemic attack) -Lipitor   Hypotension patient with history of essential hypertension: On amlodipine 2.5 mg at home.  Hypotension resolved. -Continue holding home amlodipine. -Discontinue IV fluid -IV hydralazine as needed  MDD (major depressive disorder): Stable -Abilify, Celexa   HLD (hyperlipidemia) -Lipitor   Tobacco abuse: Reports smoking about a pack to a pack and a half in a day -Encourage cessation.  She is determined to quit.  Likes to continue nicotine patch. -Continue nicotine patch on discharge   Body mass index is 30.95 kg/m.         DVT prophylaxis:  enoxaparin (LOVENOX) injection 40 mg Start: 06/21/21  2200  Code Status: Full code Family Communication: Patient and/or RN.  Updated patient's daughter at bedside. Level of care: Progressive Cardiac.  Change level of care to MedSurg Status is: Inpatient  Remains inpatient appropriate because:IV treatments appropriate due to intensity of illness or inability to take PO and Inpatient level of care appropriate due to severity of illness  Dispo: The patient is from: Home              Anticipated d/c is to: Home              Patient currently is not medically stable to d/c.   Difficult to place patient No       Consultants:  None   Sch Meds:  Scheduled Meds:  ARIPiprazole  2 mg Oral Daily   atorvastatin  40 mg Oral Daily   azithromycin  250 mg Oral Daily   citalopram  40 mg Oral Daily   enoxaparin (LOVENOX) injection  40 mg Subcutaneous Q24H   ipratropium-albuterol  3 mL Nebulization Q4H   methylPREDNISolone (SOLU-MEDROL) injection  40 mg Intravenous Q12H   nicotine  21 mg Transdermal Daily   pantoprazole  40 mg Oral Daily   Continuous Infusions: PRN Meds:.acetaminophen, albuterol, dextromethorphan-guaiFENesin, hydrALAZINE, ondansetron (ZOFRAN) IV, oxyCODONE, traZODone  Antimicrobials: Anti-infectives (From admission, onward)    Start     Dose/Rate Route Frequency Ordered Stop   06/22/21 1000  azithromycin (ZITHROMAX) tablet 250 mg       See Hyperspace for full Linked Orders Report.   250 mg Oral Daily 06/21/21 0846 06/26/21 0959   06/21/21 1000  azithromycin (ZITHROMAX) tablet 500 mg       See Hyperspace for full Linked Orders Report.   500 mg Oral Daily 06/21/21 0846 06/21/21 0940        I have personally reviewed the following labs and images: CBC: Recent Labs  Lab 06/21/21 0647  WBC 8.5  NEUTROABS 4.5  HGB 13.9  HCT 39.7  MCV 88.0  PLT 176   BMP &GFR Recent Labs  Lab 06/21/21 0647  NA 140  K 3.9  CL 104  CO2 29  GLUCOSE 120*  BUN 13  CREATININE 0.58  CALCIUM 9.1  MG 2.3   Estimated Creatinine  Clearance: 55.5 mL/min (by C-G formula based on SCr of 0.58 mg/dL). Liver & Pancreas: Recent Labs  Lab 06/21/21 0647  AST 14*  ALT 15  ALKPHOS 80  BILITOT 0.9  PROT 6.6  ALBUMIN 3.9   No results for input(s): LIPASE, AMYLASE in the last 168 hours. No results for input(s): AMMONIA in the last 168 hours. Diabetic: No results for input(s): HGBA1C in the last 72 hours. No results for input(s): GLUCAP in the last 168 hours. Cardiac Enzymes: No results for input(s): CKTOTAL, CKMB, CKMBINDEX, TROPONINI in the last 168 hours. No results for input(s): PROBNP in the last 8760 hours. Coagulation Profile: No results for input(s): INR, PROTIME in the last 168 hours. Thyroid Function Tests: No results for input(s): TSH, T4TOTAL, FREET4, T3FREE, THYROIDAB in the last 72 hours.  Lipid Profile: No results for input(s): CHOL, HDL, LDLCALC, TRIG, CHOLHDL, LDLDIRECT in the last 72 hours. Anemia Panel: No results for input(s): VITAMINB12, FOLATE, FERRITIN, TIBC, IRON, RETICCTPCT in the last 72 hours. Urine analysis:    Component Value Date/Time   COLORURINE Yellow 03/11/2013 0421   APPEARANCEUR Clear 03/11/2013 0421   LABSPEC 1.019 03/11/2013 0421   PHURINE 5.0 03/11/2013 0421   GLUCOSEU Negative 03/11/2013 0421   HGBUR Negative 03/11/2013 0421   BILIRUBINUR Negative 04/12/2021 1341   BILIRUBINUR Negative 03/11/2013 0421   KETONESUR Negative 03/11/2013 0421   PROTEINUR Negative 04/12/2021 1341   PROTEINUR Negative 03/11/2013 0421   UROBILINOGEN negative (A) 04/12/2021 1341   NITRITE Negative 04/12/2021 1341   NITRITE Negative 03/11/2013 0421   LEUKOCYTESUR Negative 04/12/2021 1341   LEUKOCYTESUR Negative 03/11/2013 0421   Sepsis Labs: Invalid input(s): PROCALCITONIN, LACTICIDVEN  Microbiology: Recent Results (from the past 240 hour(s))  Resp Panel by RT-PCR (Flu A&B, Covid) Nasopharyngeal Swab     Status: None   Collection Time: 06/21/21  6:47 AM   Specimen: Nasopharyngeal Swab;  Nasopharyngeal(NP) swabs in vial transport medium  Result Value Ref Range Status   SARS Coronavirus 2 by RT PCR NEGATIVE NEGATIVE Final    Comment: (NOTE) SARS-CoV-2 target nucleic acids are NOT DETECTED.  The SARS-CoV-2 RNA is generally detectable in upper respiratory specimens during the acute phase of infection. The lowest concentration of SARS-CoV-2 viral copies this assay can detect is 138 copies/mL. A negative result does not preclude SARS-Cov-2 infection and should not be used as the sole basis for treatment or other patient management decisions. A negative result may occur with  improper specimen collection/handling, submission of specimen other than nasopharyngeal swab, presence of viral mutation(s) within the areas targeted by this assay, and inadequate number of viral copies(<138 copies/mL). A negative result must be combined with clinical observations, patient history, and epidemiological information. The expected result is Negative.  Fact Sheet for Patients:  BloggerCourse.com  Fact Sheet for Healthcare Providers:  SeriousBroker.it  This test is no t yet approved or cleared by the [place] FDA and  has been authorized for detection and/or diagnosis of SARS-CoV-2 by FDA under an Emergency Use Authorization (EUA). This EUA will remain  in effect (meaning this test can be used) for the duration of the COVID-19 declaration under Section 564(b)(1) of the Act, 21 U.S.C.section 360bbb-3(b)(1), unless the authorization is terminated  or revoked sooner.       Influenza A by PCR NEGATIVE NEGATIVE Final   Influenza B by PCR NEGATIVE NEGATIVE Final    Comment: (NOTE) The Xpert Xpress SARS-CoV-2/FLU/RSV plus assay is intended as an aid in the diagnosis of influenza from Nasopharyngeal swab specimens and should not be used as a sole basis for treatment. Nasal washings and aspirates are unacceptable for Xpert Xpress  SARS-CoV-2/FLU/RSV testing.  Fact Sheet for Patients: BloggerCourse.com  Fact Sheet for Healthcare Providers: SeriousBroker.it  This test is not yet approved or cleared by the [place] FDA and has been authorized for detection and/or diagnosis of SARS-CoV-2 by FDA under an Emergency Use Authorization (EUA). This EUA will remain in effect (meaning this test can be used) for the duration of the COVID-19 declaration under Section 564(b)(1) of the Act, 21 U.S.C. section 360bbb-3(b)(1), unless the authorization is terminated or revoked.  Performed at [facility], [address] [place], [place] [address]   CULTURE, BLOOD (ROUTINE X 2) w Reflex to ID Panel     Status: None (Preliminary  result)   Collection Time: 06/21/21  1:30 PM   Specimen: BLOOD  Result Value Ref Range Status   Specimen Description BLOOD LEFT WRIST  Final   Special Requests   Final    BOTTLES DRAWN AEROBIC AND ANAEROBIC Blood Culture adequate volume   Culture   Final    NO GROWTH < 24 HOURS Performed at [facility], [address] [place], [place] [address]    Report Status PENDING  Incomplete  CULTURE, BLOOD (ROUTINE X 2) w Reflex to ID Panel     Status: None (Preliminary result)   Collection Time: 06/21/21  1:30 PM   Specimen: BLOOD  Result Value Ref Range Status   Specimen Description BLOOD RIGHT FA  Final   Special Requests   Final    BOTTLES DRAWN AEROBIC AND ANAEROBIC Blood Culture adequate volume   Culture   Final    NO GROWTH < 24 HOURS Performed at [facility], [address], [place], [place] [address]    Report Status PENDING  Incomplete    Radiology Studies: No results found.    Kenedie Dirocco T. [name] [facility]  If 7PM-7AM, please contact night-coverage [email or web address] 06/22/2021, 4:30 PM

## 2021-06-22 NOTE — Progress Notes (Addendum)
SATURATION QUALIFICATIONS: (This note is used to comply with regulatory documentation for home oxygen)  Patient Saturations on Room Air at Rest = 82%  Patient Saturations on 3 Liters of oxygen while Ambulating = 89% 

## 2021-06-22 NOTE — Evaluation (Signed)
Occupational Therapy Evaluation Patient Details Name: Jennifer Hayes MRN: [id number] DOB: 04-Jan-1941 Today's Date: 06/22/2021   History of Present Illness Shanitra Phillippi is a 80yoF who comes to [facility] on 06/21/21 c SOB. Pt admitted in COPD exacerbation. PMH: dCHF, COPD on 3-4L at home, HTN, HLD, TIA< GERD< depression, chronic pain sundrome, tobacco use, memory loss, UTI.   Clinical Impression   Ms. Muckenstrum was seen for OT evaluation this date. Pt was independent in all ADL and functional mobility, living alone in a 1 level apartment home PTA. Pt on 3 liters of O2 at home. Pt reports becoming easily fatigued or out of breath with minimal exertion over the last few days. Pt currently requires supervision for safety during exertional ADL management such as bathing and LB dressing due to current functional impairments (See OT Problem List below). Pt educated in energy conservation strategies including pursed lip breathing, activity pacing, home/routines modifications, work simplification, AE/DME, prioritizing of meaningful occupations, and falls prevention. Handout provided. Pt verbalized understanding and would benefit from additional skilled OT services to maximize recall and carryover of learned techniques and facilitate implementation of learned techniques into daily routines. Do not anticipate need for f/u OT services upon hospital DC.       Recommendations for follow up therapy are one component of a multi-disciplinary discharge planning process, led by the attending physician.  Recommendations may be updated based on patient status, additional functional criteria and insurance authorization.   Follow Up Recommendations  No OT follow up    Equipment Recommendations  None recommended by OT    Recommendations for Other Services       Precautions / Restrictions Precautions Precautions: Fall Restrictions Weight Bearing Restrictions: No      Mobility Bed Mobility Overal  bed mobility: Modified Independent                  Transfers Overall transfer level: Modified independent Equipment used: None                  Balance Overall balance assessment: Modified Independent                                         ADL either performed or assessed with clinical judgement   ADL Overall ADL's : Needs assistance/impaired                                       General ADL Comments: Pt reports feeling near baseline level of functional independenece. She states she is up ad lib in room and does not require assistance for dressing or grooming. Supervision for exertional ADL management including bathing and LB dressing tasks. Pt requires ongoing education on energy conservation strategies to maximize safety and functional independence in ADL management.     Vision Baseline Vision/History: 1 Wears glasses Ability to See in Adequate Light: 1 Impaired Patient Visual Report: No change from baseline       Perception     Praxis      Pertinent Vitals/Pain Pain Assessment: No/denies pain     Hand Dominance     Extremity/Trunk Assessment Upper Extremity Assessment Upper Extremity Assessment: Overall WFL for tasks assessed   Lower Extremity Assessment Lower Extremity Assessment: Generalized weakness       Communication Communication Communication: No difficulties  Cognition Arousal/Alertness: Awake/alert Behavior During Therapy: WFL for tasks assessed/performed Overall Cognitive Status: Within Functional Limits for tasks assessed                                     General Comments  Pt recieved with [place] removed, resting on RA. SPO2 noted to be 85%. Pt placed back on [place] and rebounds to 91-93% and remains at this level t/o session.    Exercises Other Exercises Other Exercises: Pt educated on energy conservation strategies including safe use of AE/DME for ADL management, the 4 P's, pursed lip  breathing, and routines modifications to support safety and functional indep. upon hospital DC.   Shoulder Instructions      Home Living Family/patient expects to be discharged to:: Private residence Living Arrangements: Alone Available Help at Discharge: Family Type of Home: Apartment Home Access: Level entry     Home Layout: One level         Firefighter: Standard     Home Equipment: Cane - single point   Additional Comments: Home O2 floor concentrator; does not have a mobile O2 means.      Prior Functioning/Environment Level of Independence: Needs assistance  Gait / Transfers Assistance Needed: household AMB ADL's / Homemaking Assistance Needed: modified indepdent ADL; family assists with IADL management such as grocery shopping. Pt able to make light meals and do light housework as needed.            OT Problem List: Decreased strength;Decreased activity tolerance;Decreased safety awareness;Cardiopulmonary status limiting activity;Decreased knowledge of use of DME or AE      OT Treatment/Interventions: Self-care/ADL training;Therapeutic exercise;Therapeutic activities;Energy conservation;DME and/or AE instruction;Patient/family education    OT Goals(Current goals can be found in the care plan section) Acute Rehab OT Goals Patient Stated Goal: improve breathing OT Goal Formulation: With patient Time For Goal Achievement: 07/06/21 Potential to Achieve Goals: Good  OT Frequency: Min 1X/week   Barriers to D/C:            Co-evaluation              AM-PAC OT "6 Clicks" Daily Activity     Outcome Measure Help from another person eating meals?: None Help from another person taking care of personal grooming?: None Help from another person toileting, which includes using toliet, bedpan, or urinal?: A Little Help from another person bathing (including washing, rinsing, drying)?: A Little Help from another person to put on and taking off regular upper  body clothing?: None Help from another person to put on and taking off regular lower body clothing?: A Little 6 Click Score: 21   End of Session Equipment Utilized During Treatment: Gait belt;Rolling walker  Activity Tolerance: Patient tolerated treatment well Patient left: in bed  OT Visit Diagnosis: Other abnormalities of gait and mobility (R26.89);Muscle weakness (generalized) (M62.81)                Time: 5462-7035 OT Time Calculation (min): 21 min Charges:  OT General Charges $OT Visit: 1 Visit OT Evaluation $OT Eval Low Complexity: 1 Low OT Treatments $Self Care/Home Management : 8-22 mins  Rockney Ghee, M.S., OTR/L Ascom: [phone] 06/22/21, 2:45 PM

## 2021-06-22 NOTE — Progress Notes (Signed)
Orders for CPAP QHS. Attempted to place patient on cpap unit. Patient wore unit for about 45 mins and then pulled it off. She stated that she could not tolerate the mask. Removed unit from patient's room. Applied patient back on her 4L nasal cannula. Will continue to monitor.

## 2021-06-22 NOTE — Evaluation (Signed)
hysical Therapy Evaluation Patient Details Name: Jennifer Hayes MRN: [id number] DOB: September 05, 1941 Today's Date: 06/22/2021  History of Present Illness  Jennifer Hayes is a 80yoF who [facility] c SOB. Pt admitted in COPD exacerbation. PMH: dCHF, COPD on 3-4L at home, HTN, HLD, TIA< GERD< depression, chronic pain sundrome, tobacco use, memory loss, UTI.  Clinical Impression  Pt admitted with above diagnosis. Pt currently with functional limitations due to the deficits listed below (see "PT Problem List"). Patient agreeable to PT evaluation. Patient provides detailed description of PLOF and home environment. Patient's assessment this date reveals the patient requires a higher degree of supplemental O2 compared to baseline in order to complete their typical ADL. At baseline, the patient is able to perform ADL with modified independence. Pt has no apparent acute changes to strength or balance. Patient will benefit from skilled PT intervention to maximize independence and safety in mobility required for basic ADL performance at discharge.    -Resting reclined on 4L: 89%; resting short sitting at EOB: 92%  -AMB 40ft on 5L/min 86%.         Recommendations for follow up therapy are one component of a multi-disciplinary discharge planning process, led by the attending physician.  Recommendations may be updated based on patient status, additional functional criteria and insurance authorization.  Follow Up Recommendations No PT follow up    Equipment Recommendations  None recommended by PT;Other (comment) (portable O2 concentrator)    Recommendations for Other Services       Precautions / Restrictions Precautions Precautions: Fall Restrictions Weight Bearing Restrictions: No      Mobility  Bed Mobility Overal bed mobility: Modified Independent                  Transfers Overall transfer level: Modified independent Equipment used: None                 Ambulation/Gait Ambulation/Gait assistance: Supervision Gait Distance (Feet): 75 Feet Assistive device: None Gait Pattern/deviations: WFL(Within Functional Limits)     General Gait Details: alternating fwd/retro at bedside, 5L/min donned, terminal sats 88% SpO2, no dyspnea per patient  Stairs            Wheelchair Mobility    Modified Rankin (Stroke Patients Only)       Balance Overall balance assessment: Modified Independent                                           Pertinent Vitals/Pain Pain Assessment: No/denies pain    Home Living Family/patient expects to be discharged to:: Private residence Living Arrangements: Alone Available Help at Discharge: Family Type of Home: Apartment Home Access: Level entry     Home Layout: One level Home Equipment: Cane - single point Additional Comments: Home O2 floor concentrator; does not have a mobile O2 means.    Prior Function Level of Independence: Needs assistance   Gait / Transfers Assistance Needed: household AMB  ADL's / Homemaking Assistance Needed: modified indepdent ADL; family bringes groceries.  Comments: 1 fall onto couch, tipping on her feet     Hand Dominance        Extremity/Trunk Assessment                Communication      Cognition Arousal/Alertness: Awake/alert Behavior During Therapy: WFL for tasks assessed/performed Overall Cognitive Status: Within Functional Limits for  tasks assessed                                        General Comments      Exercises     Assessment/Plan    PT Assessment Patient needs continued PT services  PT Problem List Decreased activity tolerance;Decreased balance;Decreased mobility       PT Treatment Interventions Gait training;Functional mobility training;Therapeutic activities;Patient/family education    PT Goals (Current goals can be found in the Care Plan section)  Acute Rehab PT Goals Patient Stated  Goal: improve breathing PT Goal Formulation: With patient Time For Goal Achievement: 07/06/21 Potential to Achieve Goals: Good    Frequency Min 2X/week   Barriers to discharge   has no mobile O2 option    Co-evaluation               AM-PAC PT "6 Clicks" Mobility  Outcome Measure Help needed turning from your back to your side while in a flat bed without using bedrails?: None Help needed moving from lying on your back to sitting on the side of a flat bed without using bedrails?: None Help needed moving to and from a bed to a chair (including a wheelchair)?: None Help needed standing up from a chair using your arms (e.g., wheelchair or bedside chair)?: None Help needed to walk in hospital room?: A Little Help needed climbing 3-5 steps with a railing? : A Little 6 Click Score: 22    End of Session Equipment Utilized During Treatment: Oxygen Activity Tolerance: Patient tolerated treatment well;No increased pain;Treatment limited secondary to medical complications (Comment) (desat on 5L with activity) Patient left: in chair;with family/visitor present;with call bell/phone within reach   PT Visit Diagnosis: Other abnormalities of gait and mobility (R26.89);Difficulty in walking, not elsewhere classified (R26.2)    Time: 8768-1157 PT Time Calculation (min) (ACUTE ONLY): 21 min   Charges:   PT Evaluation $PT Eval Low Complexity: 1 Low         10:31 AM, 06/22/21 Rosamaria Lints, PT, DPT Physical Therapist - [facility]  [phone] (ASCOM)     Kaileb Monsanto C 06/22/2021, 10:29 AM

## 2021-06-23 DIAGNOSIS — Z720 Tobacco use: Secondary | ICD-10-CM

## 2021-06-23 DIAGNOSIS — Z8673 Personal history of transient ischemic attack (TIA), and cerebral infarction without residual deficits: Secondary | ICD-10-CM

## 2021-06-23 DIAGNOSIS — E785 Hyperlipidemia, unspecified: Secondary | ICD-10-CM

## 2021-06-23 MED ORDER — NICOTINE 21 MG/24HR TD PT24: 21.0000 mg | MEDICATED_PATCH | Freq: Every day | TRANSDERMAL | 0 refills | Status: DC

## 2021-06-23 MED ORDER — PREDNISONE 50 MG PO TABS: 50.0000 mg | ORAL_TABLET | Freq: Every day | ORAL | 0 refills | Status: DC

## 2021-06-23 MED ORDER — TRELEGY ELLIPTA 100-62.5-25 MCG/INH IN AEPB: 1.0000 | INHALATION_SPRAY | Freq: Every day | RESPIRATORY_TRACT | 11 refills | Status: DC

## 2021-06-23 MED ORDER — AZITHROMYCIN 250 MG PO TABS: 250.0000 mg | ORAL_TABLET | Freq: Every day | ORAL | 0 refills | Status: DC

## 2021-06-23 NOTE — Progress Notes (Signed)
Discussed discharge instructions with patient including medications and follow appointments.   Encourage patient to monitor daily weights and worsening SOB and to take medication as prescribed (including nicotine patch and smoking cessation).

## 2021-06-23 NOTE — Discharge Summary (Signed)
Physician Discharge Summary  Jennifer Hayes Erven YNW:[id number] DOB: Feb 05, 1941 DOA: 06/21/2021  PCP: Pcp, No  Admit date: 06/21/2021 Discharge date: 06/23/2021  Admitted From: Home Disposition: Home  Recommendations for Outpatient Follow-up:  Follow ups as below. Please obtain CBC/BMP/Mag at follow up Please follow up on the following pending results: None  Home Health: Not indicated Equipment/Devices: Patient has home oxygen  Discharge Condition: Stable CODE STATUS: Full code   Follow-up Information     Jaclyn Shaggy, MD. Schedule an appointment as soon as possible for a visit in 1 week(s).   Specialty: Internal Medicine Why: Appointment manager not in office as of 06/23/21 Call tomorrow morning to speak to Mrs. Arlana Pouch @ [phone] Contact information: [address]   [place] [address] [phone]                   Hospital Course: 80 year old F with PMH of COPD/chronic RF on 3 to 4 L by [place], diastolic CHF, HTN, HTN, HLD, TIA, GERD, depression, chronic pain syndrome on opiate and tobacco use disorder presenting with acute dyspnea, dry cough and chest tightness, and admitted for acute on chronic respiratory failure with hypoxia due to COPD exacerbation likely due to ongoing tobacco use disorder.  Oxygen initially increased to 8 L without improvement in her respiratory distress requiring BiPAP in ED.  She was slightly tachypneic.  CXR with chronic interstitial coarsening and hyperexpansion.  Basic labs and COVID-19 PCR negative.  Troponin and lactic acid within normal.  Started on systemic steroid, azithromycin and breathing treatments with improvement in his symptoms.  Ambulated on home 3 L and maintain appropriate saturation.  She is discharged on p.o. prednisone and azithromycin for 3 more days.  She is already on Trelegy Ellipta and as needed DuoNeb.  Encouraged to quit smoking cigarettes.  Outpatient follow-up with PCP as above.  See individual  problem list below for more on hospital course  Discharge Diagnoses:  Acute on chronic RF with hypoxia due to COPD exacerbation-likely due to ongoing tobacco abuse.  Resolved. Initially required BiPAP due to significant respiratory distress that did not improve on 8 L by [place]. CXR consistent with COPD without infiltrate or evidence of CHF. Serial troponin and lactic acid was normal. -Discharged on p.o. prednisone and azithromycin for 3 more days to complete treatment course -Renewed her prescription for Trelegy Ellipta. -Encouraged tobacco cessation -Advised to use minimum oxygen to keep saturation above 88%   Chronic diastolic CHF: TTE in 02/2021 with LVEF of 63%.  Appears euvolemic on exam.  BNP 72.  Does not seem to be on diuretics at home.   Chronic pain syndrome-follows by pain clinic.  On oxycodone chronically. -Continue oxycodone as needed   History of TIA/HLD -Lipitor   Hypotension patient with history of essential hypertension: On amlodipine 2.5 mg at home.  Hypotension resolved. -Resume home amlodipine   MDD (major depressive disorder): Stable -Continue home Abilify, Celexa   Tobacco abuse: Reports smoking about a pack to a pack and a half in a day -Encourage cessation.  She is determined to quit.  Likes to continue nicotine patch. -Continue nicotine patch on discharge  Class I obesity Body mass index is 31.53 kg/m.            Discharge Exam: Vitals:   06/23/21 0737 06/23/21 1059  BP: (!) 137/55 (!) 127/53  Pulse: 72 81  Resp: 16 19  Temp: 98.3 F (36.8 C) 98.7 F (37.1 C)  SpO2: 94% 91%  GENERAL: No apparent distress.  Nontoxic. HEENT: MMM.  Vision and hearing grossly intact.  NECK: Supple.  No apparent JVD.  RESP: 91% on 3 L.  No IWOB.  Fair aeration bilaterally. CVS:  RRR. Heart sounds normal.  ABD/GI/GU: Bowel sounds present. Soft. Non tender.  MSK/EXT:  Moves extremities. No apparent deformity. No edema.  SKIN: no apparent skin lesion or  wound NEURO: Awake, alert and oriented appropriately.  No apparent focal neuro deficit. PSYCH: Calm. Normal affect.   Discharge Instructions  Discharge Instructions     Call MD for:  difficulty breathing, headache or visual disturbances   Complete by: As directed    Call MD for:  extreme fatigue   Complete by: As directed    Call MD for:  persistant dizziness or light-headedness   Complete by: As directed    Call MD for:  persistant nausea and vomiting   Complete by: As directed    Call MD for:  temperature >100.4   Complete by: As directed    Diet - low sodium heart healthy   Complete by: As directed    Discharge instructions   Complete by: As directed    It has been a pleasure taking care of you!  You were hospitalized due to COPD exacerbation likely from smoking cigarettes.  Your symptoms improved with treatment.  We strongly recommend you quit smoking cigarettes, and use your inhalers as prescribed.  Your target oxygen saturation is 88 to 92%.  Higher level of oxygen is not good with COPD.  Review your new medication list and the directions on your medications before you take them.  Please follow-up with your primary care doctor in 1 to 2 weeks.   Take care,   Increase activity slowly   Complete by: As directed       Allergies as of 06/23/2021       Reactions   Bupropion Swelling   headache   Penicillins Hives        Medication List     TAKE these medications    amLODipine 2.5 MG tablet Commonly known as: NORVASC Take 1 tablet (2.5 mg total) by mouth daily.   ARIPiprazole 2 MG tablet Commonly known as: ABILIFY Take 1 tablet (2 mg total) by mouth daily.   atorvastatin 40 MG tablet Commonly known as: LIPITOR Take 1 tablet (40 mg total) by mouth daily.   azithromycin 250 MG tablet Commonly known as: ZITHROMAX Take 1 tablet (250 mg total) by mouth daily. Start taking on: June 24, 2021   citalopram 40 MG tablet Commonly known as: CELEXA Take 1  tablet (40 mg total) by mouth daily.   esomeprazole 40 MG capsule Commonly known as: NEXIUM Take 1 capsule (40 mg total) by mouth daily.   Ipratropium-Albuterol 20-100 MCG/ACT Aers respimat Commonly known as: COMBIVENT Inhale into the lungs.   ipratropium-albuterol 0.5-2.5 (3) MG/3ML Soln Commonly known as: DUONEB Take 3 mLs by nebulization 2 (two) times a day as needed.   nicotine 21 mg/24hr patch Commonly known as: NICODERM CQ - dosed in mg/24 hours Place 1 patch (21 mg total) onto the skin daily.   oxycodone 5 MG capsule Commonly known as: OXY-IR Take 5 mg by mouth 2 (two) times daily as needed for pain.   predniSONE 50 MG tablet Commonly known as: DELTASONE Take 1 tablet (50 mg total) by mouth daily with breakfast. Start taking on: June 24, 2021   traZODone 50 MG tablet Commonly known as: DESYREL Take 0.5-1 tablets (  25-50 mg total) by mouth at bedtime as needed for sleep.   Trelegy Ellipta 100-62.5-25 MCG/INH Aepb Generic drug: Fluticasone-Umeclidin-Vilant Inhale 1 puff into the lungs daily.        Consultations: None  Procedures/Studies:  DG Chest Port 1 View  Result Date: 06/21/2021 CLINICAL DATA:  Respiratory distress. EXAM: PORTABLE CHEST 1 VIEW COMPARISON:  03/22/2020 FINDINGS: 0717 hours. The lungs are clear without focal pneumonia, edema, pneumothorax or pleural effusion. The cardiopericardial silhouette is within normal limits for size. Interstitial markings are diffusely coarsened with chronic features. Skin folds overlie the right lung. Bones are diffusely demineralized. Degenerative changes noted in both shoulders. Telemetry leads overlie the chest. IMPRESSION: Hyperexpansion with chronic interstitial coarsening. No acute cardiopulmonary findings. Electronically Signed   By: Kennith Center M.D.   On: 06/21/2021 07:41       The results of significant diagnostics from this hospitalization (including imaging, microbiology, ancillary and laboratory)  are listed below for reference.     Microbiology: Recent Results (from the past 240 hour(s))  Resp Panel by RT-PCR (Flu A&B, Covid) Nasopharyngeal Swab     Status: None   Collection Time: 06/21/21  6:47 AM   Specimen: Nasopharyngeal Swab; Nasopharyngeal(NP) swabs in vial transport medium  Result Value Ref Range Status   SARS Coronavirus 2 by RT PCR NEGATIVE NEGATIVE Final    Comment: (NOTE) SARS-CoV-2 target nucleic acids are NOT DETECTED.  The SARS-CoV-2 RNA is generally detectable in upper respiratory specimens during the acute phase of infection. The lowest concentration of SARS-CoV-2 viral copies this assay can detect is 138 copies/mL. A negative result does not preclude SARS-Cov-2 infection and should not be used as the sole basis for treatment or other patient management decisions. A negative result may occur with  improper specimen collection/handling, submission of specimen other than nasopharyngeal swab, presence of viral mutation(s) within the areas targeted by this assay, and inadequate number of viral copies(<138 copies/mL). A negative result must be combined with clinical observations, patient history, and epidemiological information. The expected result is Negative.  Fact Sheet for Patients:  BloggerCourse.com  Fact Sheet for Healthcare Providers:  SeriousBroker.it  This test is no t yet approved or cleared by the [place] FDA and  has been authorized for detection and/or diagnosis of SARS-CoV-2 by FDA under an Emergency Use Authorization (EUA). This EUA will remain  in effect (meaning this test can be used) for the duration of the COVID-19 declaration under Section 564(b)(1) of the Act, 21 U.S.C.section 360bbb-3(b)(1), unless the authorization is terminated  or revoked sooner.       Influenza A by PCR NEGATIVE NEGATIVE Final   Influenza B by PCR NEGATIVE NEGATIVE Final    Comment: (NOTE) The Xpert Xpress  SARS-CoV-2/FLU/RSV plus assay is intended as an aid in the diagnosis of influenza from Nasopharyngeal swab specimens and should not be used as a sole basis for treatment. Nasal washings and aspirates are unacceptable for Xpert Xpress SARS-CoV-2/FLU/RSV testing.  Fact Sheet for Patients: BloggerCourse.com  Fact Sheet for Healthcare Providers: SeriousBroker.it  This test is not yet approved or cleared by the [place] FDA and has been authorized for detection and/or diagnosis of SARS-CoV-2 by FDA under an Emergency Use Authorization (EUA). This EUA will remain in effect (meaning this test can be used) for the duration of the COVID-19 declaration under Section 564(b)(1) of the Act, 21 U.S.C. section 360bbb-3(b)(1), unless the authorization is terminated or revoked.  Performed at [facility], [address], [place], [place]  [address]   CULTURE, BLOOD (ROUTINE X 2) w Reflex to ID Panel     Status: None (Preliminary result)   Collection Time: 06/21/21  1:30 PM   Specimen: BLOOD  Result Value Ref Range Status   Specimen Description BLOOD LEFT WRIST  Final   Special Requests   Final    BOTTLES DRAWN AEROBIC AND ANAEROBIC Blood Culture adequate volume   Culture   Final    NO GROWTH 2 DAYS Performed at [facility], [address] [place], [place] [address]    Report Status PENDING  Incomplete  CULTURE, BLOOD (ROUTINE X 2) w Reflex to ID Panel     Status: None (Preliminary result)   Collection Time: 06/21/21  1:30 PM   Specimen: BLOOD  Result Value Ref Range Status   Specimen Description BLOOD RIGHT FA  Final   Special Requests   Final    BOTTLES DRAWN AEROBIC AND ANAEROBIC Blood Culture adequate volume   Culture   Final    NO GROWTH 2 DAYS Performed at [facility], [address], [place], [place] [address]    Report Status PENDING  Incomplete     Labs:  CBC: Recent Labs  Lab  06/21/21 0647  WBC 8.5  NEUTROABS 4.5  HGB 13.9  HCT 39.7  MCV 88.0  PLT 176   BMP &GFR Recent Labs  Lab 06/21/21 0647  NA 140  K 3.9  CL 104  CO2 29  GLUCOSE 120*  BUN 13  CREATININE 0.58  CALCIUM 9.1  MG 2.3   Estimated Creatinine Clearance: 56.1 mL/min (by C-G formula based on SCr of 0.58 mg/dL). Liver & Pancreas: Recent Labs  Lab 06/21/21 0647  AST 14*  ALT 15  ALKPHOS 80  BILITOT 0.9  PROT 6.6  ALBUMIN 3.9   No results for input(s): LIPASE, AMYLASE in the last 168 hours. No results for input(s): AMMONIA in the last 168 hours. Diabetic: No results for input(s): HGBA1C in the last 72 hours. No results for input(s): GLUCAP in the last 168 hours. Cardiac Enzymes: No results for input(s): CKTOTAL, CKMB, CKMBINDEX, TROPONINI in the last 168 hours. No results for input(s): PROBNP in the last 8760 hours. Coagulation Profile: No results for input(s): INR, PROTIME in the last 168 hours. Thyroid Function Tests: No results for input(s): TSH, T4TOTAL, FREET4, T3FREE, THYROIDAB in the last 72 hours. Lipid Profile: No results for input(s): CHOL, HDL, LDLCALC, TRIG, CHOLHDL, LDLDIRECT in the last 72 hours. Anemia Panel: No results for input(s): VITAMINB12, FOLATE, FERRITIN, TIBC, IRON, RETICCTPCT in the last 72 hours. Urine analysis:    Component Value Date/Time   COLORURINE Yellow 03/11/2013 0421   APPEARANCEUR Clear 03/11/2013 0421   LABSPEC 1.019 03/11/2013 0421   PHURINE 5.0 03/11/2013 0421   GLUCOSEU Negative 03/11/2013 0421   HGBUR Negative 03/11/2013 0421   BILIRUBINUR Negative 04/12/2021 1341   BILIRUBINUR Negative 03/11/2013 0421   KETONESUR Negative 03/11/2013 0421   PROTEINUR Negative 04/12/2021 1341   PROTEINUR Negative 03/11/2013 0421   UROBILINOGEN negative (A) 04/12/2021 1341   NITRITE Negative 04/12/2021 1341   NITRITE Negative 03/11/2013 0421   LEUKOCYTESUR Negative 04/12/2021 1341   LEUKOCYTESUR Negative 03/11/2013 0421   Sepsis  Labs: Invalid input(s): PROCALCITONIN, LACTICIDVEN   Time coordinating discharge: 45 minutes  SIGNED:  Almon Hercules, MD  [facility] 06/23/2021, 4:56 PM  If 7PM-7AM, please contact night-coverage [email or web address] Home

## 2021-06-23 NOTE — Plan of Care (Signed)

## 2021-06-26 LAB — CULTURE, BLOOD (ROUTINE X 2)
Culture: NO GROWTH
Culture: NO GROWTH
Special Requests: ADEQUATE
Special Requests: ADEQUATE

## 2021-06-28 ENCOUNTER — Other Ambulatory Visit: Payer: Self-pay | Admitting: *Deleted

## 2021-06-28 NOTE — Patient Outreach (Addendum)
[facility]) Care Management  06/28/2021  Arionne Iams [facility] [birth year] [id number]  Referral from [facility] for transition of care calls. Hospital dx: Acute on chronic respiratory failure  Past Medical Hx: COPD, A/CRF, CHF, HTN, HLD, TIA, GERD, depression, chronic pain, O2 dependent, smoker  Talked with [name] today and she consented to [facility] services and consent to review EMR. Enrolled in transition of care calls. Advised will call weekly over the next month. She is in agreement.  Completed Transition of care template  Pt has discharge instructions  Does not have her newly prescribed meds (daughter picking them up today) Also not taking amlodipine! Discharge orders say to resume! Does not have a primary care provider and is making arrangements for one and will hopefully be able to see them in the next         2  weeks. Pt given NP # to call for any problems. Has her inhalers, nebs and O2. Picked up on meds on her list that she is not taking (amlodipine, Abilify, pantoprazole)  Pt would like in home assistance for IADLs. [facility] and ask for information on the Aid and Attendance program.  Focus on COPD self management, medication administration, smoking cessation.  Plan: Call in one week for follow up. Advised pt she should be taking at least the amlodipine 2.5 mg now.  Zara Council. Burgess Estelle, MSN, [facility] Gerontological Nurse Practitioner [facility] Care Management [phone]

## 2021-07-05 ENCOUNTER — Other Ambulatory Visit: Payer: Self-pay | Admitting: *Deleted

## 2021-07-05 NOTE — Patient Outreach (Addendum)
[facility]) Care Management  [facility] The Care Manager  07/05/2021   Jennifer Hayes [facility] [birth year] [id number]  Subjective: Second transition of care call, following up hospitalization for respiratory failure.  Encounter Medications:  Outpatient Encounter Medications as of 06/28/2021  Medication Sig Note   atorvastatin (LIPITOR) 40 MG tablet Take 1 tablet (40 mg total) by mouth daily.    citalopram (CELEXA) 40 MG tablet Take 1 tablet (40 mg total) by mouth daily.    Fluticasone-Umeclidin-Vilant (TRELEGY ELLIPTA) 100-62.5-25 MCG/INH AEPB Inhale 1 puff into the lungs daily.    Ipratropium-Albuterol (COMBIVENT) 20-100 MCG/ACT AERS respimat Inhale into the lungs.    ipratropium-albuterol (DUONEB) 0.5-2.5 (3) MG/3ML SOLN Take 3 mLs by nebulization 2 (two) times a day as needed.    oxycodone (OXY-IR) 5 MG capsule Take 5 mg by mouth 2 (two) times daily as needed for pain. 06/28/2021: Pt reports she is taking 9 (Nine) mg as needed bid. ? If this is a typo.   traZODone (DESYREL) 50 MG tablet Take 0.5-1 tablets (25-50 mg total) by mouth at bedtime as needed for sleep.    ARIPiprazole (ABILIFY) 2 MG tablet Take 1 tablet (2 mg total) by mouth daily. (Patient not taking: No sig reported) 06/28/2021: Unsure if she took it and doesn't have it now.   nicotine (NICODERM CQ - DOSED IN MG/24 HOURS) 21 mg/24hr patch Place 1 patch (21 mg total) onto the skin daily. (Patient not taking: No sig reported)    predniSONE (DELTASONE) 50 MG tablet Take 1 tablet (50 mg total) by mouth daily with breakfast. (Patient not taking: Reported on 06/28/2021) 06/28/2021: Did not pick up yet, daughter is getting it today.   No facility-administered encounter medications on file as of 06/28/2021.    Functional Status:  In your present state of health, do you have any difficulty performing the following activities: 06/21/2021  Hearing? N  Vision? N  Difficulty concentrating or making decisions? N  Walking or  climbing stairs? Y  Dressing or bathing? Y  Doing errands, shopping? N  Some recent data might be hidden    Fall/Depression Screening: Fall Risk  04/12/2021 12/21/2020 09/15/2020  Falls in the past year? 0 0 1  Number falls in past yr: - - 0  Injury with Fall? - - 0  Risk for fall due to : - No Fall Risks -  Follow up - Falls evaluation completed -   PHQ 2/9 Scores 04/12/2021 12/21/2020 09/15/2020 05/05/2020 03/22/2020 01/20/2020 07/01/2019  PHQ - 2 Score 0 0 0 3 0 0 0    Assessment: COPD not controlled as well as desired  Care Plan   Goals Addressed             This Visit's Progress    Make appt with new primary care provider and Keep All Appointments over the next 90 days.       Timeframe:  Long-Range Goal Priority:  High Start Date:    07/05/21                         Expected End Date:     10/07/21                  Follow Up Date 10/4//22    Choose a new primary care provider ASAP - keep a calendar with appointment dates    Why is this important?   Part of staying healthy is seeing the doctor for follow-up care.  If you forget your appointments, there are some things you can do to stay on track.    Notes: 07/05/21 Pt reported on first call 06/28/21 that she needs a new primary care provider and that she intends to find one. Today, she reports she has not found a new MD. Encouraged pt to do so, especially after having been in the hospital. Follow up is important to prevent a readmission. Encouraged choosing and making an appt ASAP.     Manage my medication as evidenced by pt report that she has all meds and by reconcilliation with pharmacy over the next 90 days.       Timeframe:  Long-Range Goal Priority:  High Start Date:     07/05/21                        Expected End Date:   10/07/21                    Follow Up Date 07/12/21    - call for medicine refill 2 or 3 days before it runs out - keep a list of all the medicines I take; vitamins and herbals too - use a pillbox to  sort medicine    Why is this important?   These steps will help you keep on track with your medicines.   Notes: 07/05/21 Pt phone disconnected before NP could ask if she had the missing meds filled last week. She does admit today that she did NOT resume her BP medication (Amlodipine) as advised on last call as it was on her discharge order. Started inquiry about why she is not taking this medication but call was cut short. Called again but went to voice mail. Left message would like to continue conversation. NP did ask if she would monitor her BP so we can make a determination if she needs it or not. She does have a BP monitor.     Patient to monitor her BP over the next 30 days to establish a baseline.       Start date: 07/05/21 Short term Priority HIGH Projected end date: 08/07/21 Follow up date: 07/12/21 Barriers: Health Behaviors Knowledge  Notes: 07/05/21 Pt has a BP cuff but doesn't use it. Pt was on amlodipine but is not taking it at present, unknown reason why. Request she begin using the BP cuff to get a good sense of what her pressures are and if or not she should be taking this medication.      Pt will monitor her pulse ox level and wear her O2 if her reading is 90% or lower as reported by pt over the next 30 days.       Start date: 07/05/21 Short term goal HIGH Priority Expected end date 08/07/21 Follow up 07/12/21 Barriers: Health Behaviors Knowledge  07/05/21 Pt has a pulse ox finger monitor but doesn't know where it is. Was trying to go without her O2 and had some respiratory issues, not feeling well and she resumed using it. Encouraged her to monitor. Absolutely needs to wear her O2 if <88 but would probably benefit wearing it when 90% or below as activity will tend to lower the concentration.        Plan:  Follow-up: Patient agrees to Care Plan and Follow-up. Follow-up in 1 week(s)  Noralyn Pick C. Burgess Estelle, MSN, [facility] Gerontological Nurse Practitioner [facility] Care  Management [phone]

## 2021-07-19 ENCOUNTER — Other Ambulatory Visit: Payer: Self-pay | Admitting: *Deleted

## 2021-07-19 NOTE — Patient Outreach (Signed)
  [facility]) Care Management  [facility] Care Manager  07/19/2021   Malcolm Hetz [facility] 1941/07/16 [id number]  Subjective: Third outreach call for [facility] follow up, unsuccessful. Left a message on pt's phone and also called and left message for her daughter to request assistance in reaching Ms. Muckenstrum.  Zara Council. Burgess Estelle, MSN, GNP-BC Gerontological Nurse Practitioner [facility] Care Management [phone]  Daughter provided home number. NP to call pt later this week.  Zara Council. Burgess Estelle, MSN, [facility] Gerontological Nurse Practitioner [facility] Care Management [phone]

## 2021-07-21 ENCOUNTER — Other Ambulatory Visit: Payer: Medicare Other | Admitting: *Deleted

## 2021-07-21 NOTE — Patient Outreach (Signed)
[facility]) Care Management  [facility] Care Manager  07/21/2021   Marisal Swarey [facility] [birth year] [id number]  Subjective: Telephone outreach to f/u on HTN, COPD  Encounter Medications:  Outpatient Encounter Medications as of 07/21/2021  Medication Sig Note   atorvastatin (LIPITOR) 40 MG tablet Take 1 tablet (40 mg total) by mouth daily.    citalopram (CELEXA) 40 MG tablet Take 1 tablet (40 mg total) by mouth daily.    Fluticasone-Umeclidin-Vilant (TRELEGY ELLIPTA) 100-62.5-25 MCG/INH AEPB Inhale 1 puff into the lungs daily.    Ipratropium-Albuterol (COMBIVENT) 20-100 MCG/ACT AERS respimat Inhale into the lungs.    ipratropium-albuterol (DUONEB) 0.5-2.5 (3) MG/3ML SOLN Take 3 mLs by nebulization 2 (two) times a day as needed.    nicotine (NICODERM CQ - DOSED IN MG/24 HOURS) 21 mg/24hr patch Place 1 patch (21 mg total) onto the skin daily.    oxycodone (OXY-IR) 5 MG capsule Take 5 mg by mouth 2 (two) times daily as needed for pain. 06/28/2021: Pt reports she is taking 9 (Nine) mg as needed bid. ? If this is a typo.   traZODone (DESYREL) 50 MG tablet Take 0.5-1 tablets (25-50 mg total) by mouth at bedtime as needed for sleep.    ARIPiprazole (ABILIFY) 2 MG tablet Take 1 tablet (2 mg total) by mouth daily. (Patient not taking: No sig reported) 06/28/2021: Unsure if she took it and doesn't have it now.   predniSONE (DELTASONE) 50 MG tablet Take 1 tablet (50 mg total) by mouth daily with breakfast. (Patient not taking: No sig reported) 06/28/2021: Did not pick up yet, daughter is getting it today.   No facility-administered encounter medications on file as of 07/21/2021.    Functional Status:  In your present state of health, do you have any difficulty performing the following activities: 06/21/2021  Hearing? N  Vision? N  Difficulty concentrating or making decisions? N  Walking or climbing stairs? Y  Dressing or bathing? Y  Doing errands, shopping? N  Some recent data  might be hidden    Fall/Depression Screening: Fall Risk  04/12/2021 12/21/2020 09/15/2020  Falls in the past year? 0 0 1  Number falls in past yr: - - 0  Injury with Fall? - - 0  Risk for fall due to : - No Fall Risks -  Follow up - Falls evaluation completed -   PHQ 2/9 Scores 04/12/2021 12/21/2020 09/15/2020 05/05/2020 03/22/2020 01/20/2020 07/01/2019  PHQ - 2 Score 0 0 0 3 0 0 0    Assessment: HTN - stable   COPD status improved with smoking cessation  Care Plan   Goals Addressed             This Visit's Progress    Make appt with new primary care provider and Keep All Appointments over the next 90 days.   On track    Timeframe:  Long-Range Goal Priority:  High Start Date:    07/05/21                         Expected End Date:     10/07/21                  Follow Up Date 10/4//22    Choose a new primary care provider ASAP - keep a calendar with appointment dates    Why is this important?   Part of staying healthy is seeing the doctor for follow-up care.  If you forget your appointments,  there are some things you can do to stay on track.    Notes:  07/21/21 Pt has chosen a new primary care provider (Dr. Arlana Pouch) and is on the waiting list for a new pt appt. 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 Advised it is extremely important to be seen after a hospitalization to prevent a readmission. Reminded to use new [facility] calendar to keep record of her upcoming appts. 07/05/21 Pt reported on first call 06/28/21 that she needs a new primary care provider and that she intends to find one. Today, she reports she has not found a new MD. Encouraged pt to do so, especially after having been in the hospital. Follow up is important to prevent a readmission. Encouraged choosing and making an appt ASAP.     Manage my medication as evidenced by pt report that she has all meds and by reconcilliation with pharmacy over the next 90 days.   On track    Timeframe:  Long-Range Goal Priority:  High Start Date:      07/05/21                        Expected End Date:   10/07/21                    Follow Up Date 08/22/21  - call for medicine refill 2 or 3 days before it runs out - keep a list of all the medicines I take; vitamins and herbals too - use a pillbox to sort medicine    Why is this important?   These steps will help you keep on track with your medicines.   Notes: 07/21/21 Pt reports she is not taking amlodipine. She has been checking her BPs at home and they are running in the 120's/70's. NP advised feel better about that but it will be good for her new provider to evaluate and make a judgement as to whether she needs to be on an antihypertensive med. 07/05/21 Pt phone disconnected before NP could ask if she had the missing meds filled last week. She does admit today that she did NOT resume her BP medication (Amlodipine) as advised on last call as it was on her discharge order. Started inquiry about why she is not taking this medication but call was cut short. Called again but went to voice mail. Left message would like to continue conversation. NP did ask if she would monitor her BP so we can make a determination if she needs it or not. She does have a BP monitor.     COMPLETED: Patient to monitor her BP over the next 30 days to establish a baseline.       Start date: 07/05/21 Short term Priority HIGH Projected end date: 08/07/21, extend until pt has new provider evaluation Follow up date: 08/22/21 Barriers: Health Behaviors Knowledge  Notes: 07/21/21 Pt has been checking her BPs at home. She says they are in the 120's/70's. She does not know how to look at the hx on her machine. NP has sent a calendar for her to record her  BPs, appts, etc.  07/05/21 Pt has a BP cuff but doesn't use it. Pt was on amlodipine but is not taking it at present, unknown reason why. Request she begin using the BP cuff to get a good sense of what her pressures are and if or not she should be taking this medication.       Pt will monitor her  pulse ox level and wear her O2 if her reading is 90% or lower as reported by pt over the next 30 days.   Not on track    Start date: 07/05/21 Short term goal HIGH Priority Expected end date 08/07/21, extend to next call date 08/22/21 Follow up 07/12/21 Barriers: Health Behaviors Knowledge  07/21/21 Pt admits she has not been checking her pulse ox but hasn't felt she needs it or her oxygen. She reports she has not smoked in about 10 days. She is wearing a nicotine patch. She reports she feels so much better! Praised for her commitment to quit smoking. Virtually patted her on the back, this is the best thing you can do to improve your quality of life! Please do check your pulse O2 and record it in your [facility] book to report back to NP. 07/05/21 Pt has a pulse ox finger monitor but doesn't know where it is. Was trying to go without her O2 and had some respiratory issues, not feeling well and she resumed using it. Encouraged her to monitor. Absolutely needs to wear her O2 if <88 but would probably benefit wearing it when 90% or below as activity will tend to lower the concentration.        Plan:  Follow-up: Patient agrees to Care Plan and Follow-up. Follow-up in 1 month(s)  Israella Hubert C. Burgess Estelle, MSN, [facility] Gerontological Nurse Practitioner [facility] Care Management [phone]

## 2021-08-05 ENCOUNTER — Other Ambulatory Visit: Payer: Self-pay

## 2021-08-05 ENCOUNTER — Emergency Department: Payer: Medicare Other

## 2021-08-05 DIAGNOSIS — R062 Wheezing: Secondary | ICD-10-CM | POA: Diagnosis not present

## 2021-08-05 DIAGNOSIS — Z5321 Procedure and treatment not carried out due to patient leaving prior to being seen by health care provider: Secondary | ICD-10-CM | POA: Insufficient documentation

## 2021-08-05 DIAGNOSIS — R059 Cough, unspecified: Secondary | ICD-10-CM | POA: Diagnosis not present

## 2021-08-05 DIAGNOSIS — R0602 Shortness of breath: Secondary | ICD-10-CM | POA: Insufficient documentation

## 2021-08-05 LAB — BASIC METABOLIC PANEL
Anion gap: 6 (ref 5–15)
BUN: 16 mg/dL (ref 8–23)
CO2: 27 mmol/L (ref 22–32)
Calcium: 9 mg/dL (ref 8.9–10.3)
Chloride: 104 mmol/L (ref 98–111)
Creatinine, Ser: 0.71 mg/dL (ref 0.44–1.00)
GFR, Estimated: >60 mL/min (ref >60)
Glucose, Bld: 107 mg/dL — ABNORMAL HIGH (ref 70–99)
Potassium: 3.9 mmol/L (ref 3.5–5.1)
Sodium: 137 mmol/L (ref 135–145)

## 2021-08-05 LAB — CBC
HCT: 38 % (ref 36.0–46.0)
Hemoglobin: 13.3 g/dL (ref 12.0–15.0)
MCH: 31.1 pg (ref 26.0–34.0)
MCHC: 35 g/dL (ref 30.0–36.0)
MCV: 88.8 fL (ref 80.0–100.0)
Platelets: 144 10*3/uL — ABNORMAL LOW (ref 150–400)
RBC: 4.28 MIL/uL (ref 3.87–5.11)
RDW: 12.6 % (ref 11.5–15.5)
WBC: 5.8 10*3/uL (ref 4.0–10.5)
nRBC: 0 % (ref 0.0–0.2)

## 2021-08-05 NOTE — ED Triage Notes (Signed)
Pt to ED for shob for past few days. Hx COPD. Denies cp. States shob feels better now.  Speaking in complete sentences, NAD noted

## 2021-08-05 NOTE — ED Notes (Signed)
Placed on 2L [place]  

## 2021-08-05 NOTE — ED Triage Notes (Signed)
First nurse Note:  Arrives via [facility] from home.  C/O SOB x 1 week. Wheezing on EMS arrival . 2 duo nebs given.  + cough.  VS wnl.

## 2021-08-06 ENCOUNTER — Emergency Department
Admission: EM | Admit: 2021-08-06 | Discharge: 2021-08-06 | Disposition: A | Discharge status: Home / Self Care | Payer: Medicare Other | Attending: Student in an Organized Health Care Education/Training Program | Admitting: Student in an Organized Health Care Education/Training Program

## 2021-08-06 LAB — TROPONIN I (HIGH SENSITIVITY): Troponin I (High Sensitivity): <2 ng/L (ref <18)

## 2021-08-22 ENCOUNTER — Other Ambulatory Visit: Payer: Self-pay | Admitting: *Deleted

## 2021-08-22 NOTE — Patient Outreach (Addendum)
[facility]) Care Management  08/22/2021  [name] 11/22/1940 [id number]  [facility]) Care Management Geriatric Nurse Practitioner Note   08/22/2021 Name:  Jennifer Hayes MRN:  [id number] DOB:  1941/08/27  Summary: Pt reports her COPD is stable since her ED visit on 08/05/21  Recommendations/Changes made from today's visit: Please use your COPD CONTROLLING INHALER "TRELEGY" DAILY.  Subjective: Jennifer Hayes is an 80 y.o. year old female who is a primary patient of No primary care provider on file. The care management team was consulted for assistance with care management and/or care coordination needs.  Geriatric Nurse Practitioner completed Telephone Visit today  Jennifer Hayes spoke with NP briefly stating that she is better since she went to the ED. She thanked NP for her call and ended the conversation.  Objective: Pt did not report her BPs today.  Medications Reviewed Today     Reviewed by Deloria Lair, NP (Nurse Practitioner) on 08/22/21 at 74  Med List Status: <None>   Medication Order Taking? Sig Documenting Provider Last Dose Status Informant  ARIPiprazole (ABILIFY) 2 MG tablet [id number] No Take 1 tablet (2 mg total) by mouth daily.  Patient not taking: No sig reported   [name], NP Not Taking Active            Med Note [name], Toluwanimi Radebaugh   Tue Jun 28, 2021 11:20 AM) Marena Chancy if she took it and doesn't have it now.  atorvastatin (LIPITOR) 40 MG tablet [id number] No Take 1 tablet (40 mg total) by mouth daily. [name], NP Taking Active   citalopram (CELEXA) 40 MG tablet [id number] No Take 1 tablet (40 mg total) by mouth daily. [name], NP Taking Active   Fluticasone-Umeclidin-Vilant (TRELEGY ELLIPTA) 100-62.5-25 MCG/INH AEPB [id number] No Inhale 1 puff into the lungs daily. Mercy Riding, MD Taking Active   Ipratropium-Albuterol (COMBIVENT) 20-100 MCG/ACT AERS respimat  [id number] No Inhale into the lungs. [provider] Taking Active   ipratropium-albuterol (DUONEB) 0.5-2.5 (3) MG/3ML SOLN [id number] No Take 3 mLs by nebulization 2 (two) times a day as needed. Kendell Bane, NP Taking Active   nicotine (NICODERM CQ - DOSED IN MG/24 HOURS) 21 mg/24hr patch [id number] No Place 1 patch (21 mg total) onto the skin daily. Mercy Riding, MD Taking Active   oxycodone (OXY-IR) 5 MG capsule [id number] No Take 5 mg by mouth 2 (two) times daily as needed for pain. [provider] Taking Active            Med Note [name], [name]   Tue Jun 28, 2021 11:43 AM) Pt reports she is taking 9 (Nine) mg as needed bid. ? If this is a typo.  predniSONE (DELTASONE) 50 MG tablet [id number] No Take 1 tablet (50 mg total) by mouth daily with breakfast.  Patient not taking: No sig reported   Mercy Riding, MD Not Taking Active            Med Note [name], [name]   Tue Jun 28, 2021 11:43 AM) Did not pick up yet, daughter is getting it today.  traZODone (DESYREL) 50 MG tablet [id number] No Take 0.5-1 tablets (25-50 mg total) by mouth at bedtime as needed for sleep. [name], NP Taking Active            SDOH:  (Social Determinants of Health) assessments and interventions performed: No needs at this time.  Care Plan  Review of patient past medical history, allergies, medications, health  status, including review of consultants reports, laboratory and other test data, was performed as part of comprehensive evaluation for care management services.   Care Plan : RN Care Manager Plan of Care  Updates made by Deloria Lair, NP since 08/22/2021 12:00 AM     Problem: Chronic Disease Management: COPD & HTN   Priority: High     Long-Range Goal: Chronic Disease Management: COPD & HTN   Start Date: 08/22/2021  Expected End Date: 12/06/2021  Priority: High  Note:   Current Barriers:  Knowledge Deficits related to plan of care for management of HTN and COPD No  Advanced Directives in place  RNCM Clinical Goal(s):  Patient will verbalize understanding of plan for management of HTN and COPD verbalize basic understanding of  HTN and COPD disease process and self health management plan within the next 90 days. take all medications exactly as prescribed and will call provider for medication related questions attend all scheduled medical appointments: within the next 90 days. demonstrate Improved adherence to prescribed treatment plan for HTN and COPD as evidenced by reporting BP values and no ED visits for SOB over the next 90 days. demonstrate a decrease in HTN and COPD exacerbations by monthly pt report. will demonstrate ongoing self health care management ability improving self care reports each month collaborate with the care management team towards completion of advanced directives by the end of 90 days  through collaboration with RN Care manager, provider, and care team.   Interventions: Inter-disciplinary care team collaboration (see longitudinal plan of care) Evaluation of current treatment plan related to  self management and patient's adherence to plan as established by provider  Hypertension Interventions: Last practice recorded BP readings:  BP Readings from Last 3 Encounters:  08/05/21 128/77  06/23/21 (!) 127/53  05/16/21 112/70  Most recent eGFR/CrCl: No results found for: EGFR  No components found for: CRCL  Reviewed medications with patient and discussed importance of compliance; Assessed social determinant of health barriers;   COPD Interventions:   Provided patient with basic written and verbal COPD education on self care/management/and exacerbation prevention; Provided instruction about proper use of medications used for management of COPD including inhalers; Screening for signs and symptoms of depression related to chronic disease state;  Assessed social determinant of health barriers;   Patient Goals/Self-Care  Activities: Patient will self administer medications as prescribed Patient will attend all scheduled provider appointments Patient will call pharmacy for medication refills Patient will call provider office for new concerns or questions  Follow Up Plan:  Telephone follow up appointment with care management team member scheduled for:  2 weeks.       Plan: Telephone follow up appointment with care management team member scheduled for:  2 WEEKS.  Will send letter with expectations to discuss goal progression. Allow 10 minutes.  Eulah Pont. [name], MSN, [facility] Gerontological Nurse Practitioner [facility] Care Management [phone]

## 2021-09-07 ENCOUNTER — Other Ambulatory Visit: Payer: Self-pay | Admitting: *Deleted

## 2021-09-07 ENCOUNTER — Ambulatory Visit: Admitting: *Deleted

## 2021-09-07 NOTE — Patient Outreach (Signed)
  [facility]) Care Management  [facility] Care Manager  09/07/2021   Meleni Delahunt [facility] 06-27-1941 [id number]  Subjective: Pt has shown reluctance in previous conversations about participation. She has indicated today, she does not want to continue in the program. She feels like she is doing OK and has made progress on the previously established goals.  Encounter Medications:  Outpatient Encounter Medications as of 09/07/2021  Medication Sig Note   ARIPiprazole (ABILIFY) 2 MG tablet Take 1 tablet (2 mg total) by mouth daily. (Patient not taking: No sig reported) 06/28/2021: Unsure if she took it and doesn't have it now.   atorvastatin (LIPITOR) 40 MG tablet Take 1 tablet (40 mg total) by mouth daily.    citalopram (CELEXA) 40 MG tablet Take 1 tablet (40 mg total) by mouth daily.    Fluticasone-Umeclidin-Vilant (TRELEGY ELLIPTA) 100-62.5-25 MCG/INH AEPB Inhale 1 puff into the lungs daily.    Ipratropium-Albuterol (COMBIVENT) 20-100 MCG/ACT AERS respimat Inhale into the lungs.    ipratropium-albuterol (DUONEB) 0.5-2.5 (3) MG/3ML SOLN Take 3 mLs by nebulization 2 (two) times a day as needed.    nicotine (NICODERM CQ - DOSED IN MG/24 HOURS) 21 mg/24hr patch Place 1 patch (21 mg total) onto the skin daily.    oxycodone (OXY-IR) 5 MG capsule Take 5 mg by mouth 2 (two) times daily as needed for pain. 06/28/2021: Pt reports she is taking 9 (Nine) mg as needed bid. ? If this is a typo.   predniSONE (DELTASONE) 50 MG tablet Take 1 tablet (50 mg total) by mouth daily with breakfast. (Patient not taking: No sig reported) 06/28/2021: Did not pick up yet, daughter is getting it today.   traZODone (DESYREL) 50 MG tablet Take 0.5-1 tablets (25-50 mg total) by mouth at bedtime as needed for sleep.    No facility-administered encounter medications on file as of 09/07/2021.    Functional Status:  In your present state of health, do you have any difficulty performing the following activities:  06/21/2021  Hearing? N  Vision? N  Difficulty concentrating or making decisions? N  Walking or climbing stairs? Y  Dressing or bathing? Y  Doing errands, shopping? N  Some recent data might be hidden    Fall/Depression Screening: Fall Risk  04/12/2021 12/21/2020 09/15/2020  Falls in the past year? 0 0 1  Number falls in past yr: - - 0  Injury with Fall? - - 0  Risk for fall due to : - No Fall Risks -  Follow up - Falls evaluation completed -   PHQ 2/9 Scores 04/12/2021 12/21/2020 09/15/2020 05/05/2020 03/22/2020 01/20/2020 07/01/2019  PHQ - 2 Score 0 0 0 3 0 0 0    Assessment: COPD  Plan: Will close case per patient request. Follow-up: Patient requests no follow-up at this time.  Zara Council. Burgess Estelle, MSN, [facility] Gerontological Nurse Practitioner [facility] Care Management [phone]

## 2021-10-04 ENCOUNTER — Other Ambulatory Visit: Payer: Self-pay

## 2021-10-04 ENCOUNTER — Encounter: Payer: Self-pay | Admitting: Radiology

## 2021-10-04 ENCOUNTER — Emergency Department: Payer: Medicare Other

## 2021-10-04 ENCOUNTER — Emergency Department
Admission: EM | Admit: 2021-10-04 | Discharge: 2021-10-04 | Disposition: A | Discharge status: Home / Self Care | Payer: Medicare Other | Attending: Emergency Medicine | Admitting: Emergency Medicine

## 2021-10-04 DIAGNOSIS — Z7951 Long term (current) use of inhaled steroids: Secondary | ICD-10-CM | POA: Insufficient documentation

## 2021-10-04 DIAGNOSIS — I110 Hypertensive heart disease with heart failure: Secondary | ICD-10-CM | POA: Diagnosis not present

## 2021-10-04 DIAGNOSIS — R0789 Other chest pain: Secondary | ICD-10-CM | POA: Insufficient documentation

## 2021-10-04 DIAGNOSIS — R079 Chest pain, unspecified: Secondary | ICD-10-CM

## 2021-10-04 DIAGNOSIS — I5032 Chronic diastolic (congestive) heart failure: Secondary | ICD-10-CM | POA: Insufficient documentation

## 2021-10-04 DIAGNOSIS — R0602 Shortness of breath: Secondary | ICD-10-CM | POA: Insufficient documentation

## 2021-10-04 DIAGNOSIS — Z79899 Other long term (current) drug therapy: Secondary | ICD-10-CM | POA: Diagnosis not present

## 2021-10-04 DIAGNOSIS — J441 Chronic obstructive pulmonary disease with (acute) exacerbation: Secondary | ICD-10-CM | POA: Diagnosis not present

## 2021-10-04 DIAGNOSIS — G8929 Other chronic pain: Secondary | ICD-10-CM | POA: Insufficient documentation

## 2021-10-04 DIAGNOSIS — F17210 Nicotine dependence, cigarettes, uncomplicated: Secondary | ICD-10-CM | POA: Insufficient documentation

## 2021-10-04 DIAGNOSIS — M25511 Pain in right shoulder: Secondary | ICD-10-CM | POA: Insufficient documentation

## 2021-10-04 DIAGNOSIS — Z20822 Contact with and (suspected) exposure to covid-19: Secondary | ICD-10-CM | POA: Diagnosis not present

## 2021-10-04 DIAGNOSIS — M25519 Pain in unspecified shoulder: Secondary | ICD-10-CM

## 2021-10-04 LAB — CBC WITH DIFFERENTIAL/PLATELET
Abs Immature Granulocytes: 0.02 10*3/uL (ref 0.00–0.07)
Basophils Absolute: 0.1 10*3/uL (ref 0.0–0.1)
Basophils Relative: 1 %
Eosinophils Absolute: 0.3 10*3/uL (ref 0.0–0.5)
Eosinophils Relative: 4 %
HCT: 38.8 % (ref 36.0–46.0)
Hemoglobin: 13.3 g/dL (ref 12.0–15.0)
Immature Granulocytes: 0 %
Lymphocytes Relative: 37 %
Lymphs Abs: 3.2 10*3/uL (ref 0.7–4.0)
MCH: 29.8 pg (ref 26.0–34.0)
MCHC: 34.3 g/dL (ref 30.0–36.0)
MCV: 87 fL (ref 80.0–100.0)
Monocytes Absolute: 0.5 10*3/uL (ref 0.1–1.0)
Monocytes Relative: 6 %
Neutro Abs: 4.5 10*3/uL (ref 1.7–7.7)
Neutrophils Relative %: 52 %
Platelets: 170 10*3/uL (ref 150–400)
RBC: 4.46 MIL/uL (ref 3.87–5.11)
RDW: 12.1 % (ref 11.5–15.5)
WBC: 8.6 10*3/uL (ref 4.0–10.5)
nRBC: 0 % (ref 0.0–0.2)

## 2021-10-04 LAB — TROPONIN I (HIGH SENSITIVITY)
Troponin I (High Sensitivity): 4 ng/L (ref <18)
Troponin I (High Sensitivity): 4 ng/L (ref <18)

## 2021-10-04 LAB — LIPASE, BLOOD: Lipase: 29 U/L (ref 11–51)

## 2021-10-04 LAB — COMPREHENSIVE METABOLIC PANEL
ALT: 14 U/L (ref 0–44)
AST: 15 U/L (ref 15–41)
Albumin: 3.6 g/dL (ref 3.5–5.0)
Alkaline Phosphatase: 75 U/L (ref 38–126)
Anion gap: 6 (ref 5–15)
BUN: 14 mg/dL (ref 8–23)
CO2: 26 mmol/L (ref 22–32)
Calcium: 8.8 mg/dL — ABNORMAL LOW (ref 8.9–10.3)
Chloride: 106 mmol/L (ref 98–111)
Creatinine, Ser: 0.62 mg/dL (ref 0.44–1.00)
GFR, Estimated: >60 mL/min (ref >60)
Glucose, Bld: 113 mg/dL — ABNORMAL HIGH (ref 70–99)
Potassium: 3.5 mmol/L (ref 3.5–5.1)
Sodium: 138 mmol/L (ref 135–145)
Total Bilirubin: 0.5 mg/dL (ref 0.3–1.2)
Total Protein: 6.2 g/dL — ABNORMAL LOW (ref 6.5–8.1)

## 2021-10-04 LAB — RESP PANEL BY RT-PCR (FLU A&B, COVID) ARPGX2
Influenza A by PCR: NEGATIVE
Influenza B by PCR: NEGATIVE
SARS Coronavirus 2 by RT PCR: NEGATIVE

## 2021-10-04 LAB — PROTIME-INR
INR: 1 (ref 0.8–1.2)
Prothrombin Time: 12.8 seconds (ref 11.4–15.2)

## 2021-10-04 LAB — MAGNESIUM: Magnesium: 2 mg/dL (ref 1.7–2.4)

## 2021-10-04 MED ORDER — ONDANSETRON HCL 4 MG/2ML IJ SOLN
4.0000 mg | INTRAMUSCULAR | Status: AC
  Administered 2021-10-04: 03:00:00 4 mg via INTRAVENOUS
  Filled 2021-10-04: qty 2

## 2021-10-04 MED ORDER — MORPHINE SULFATE (PF) 4 MG/ML IV SOLN
4.0000 mg | Freq: Once | INTRAVENOUS | Status: AC
  Administered 2021-10-04: 03:00:00 4 mg via INTRAVENOUS
  Filled 2021-10-04: qty 1

## 2021-10-04 NOTE — Discharge Instructions (Addendum)
Your workup in the Emergency Department today was reassuring.  We did not find any specific abnormalities.  We recommend you drink plenty of fluids, take your regular medications and/or any new ones prescribed today, and follow up with the doctor(s) listed in these documents as recommended.  Return to the Emergency Department if you develop new or worsening symptoms that concern you.  

## 2021-10-04 NOTE — ED Triage Notes (Signed)
Patient from home with c/o chest pain, right shoulder pain, shortness of breath; hx of smoking/ COPD/ DM; A/O x4; skin warm/ dry; respirations even non-labored

## 2021-10-04 NOTE — ED Provider Notes (Signed)
[facility] Emergency Department Provider Note  ____________________________________________   Event Date/Time   First MD Initiated Contact with Patient 10/04/21 0132     (approximate)  I have reviewed the triage vital signs and the nursing notes.   HISTORY  Chief Complaint Chest Pain    HPI Jennifer Hayes is a 80 y.o. female with extensive medical history as listed below who presents by EMS for evaluation of right his symptoms that have been going on for about 24 hours.  She reports some increased shortness of breath, chest pressure, and some worsening right shoulder pain although she says she has "bone-on-bone pain" and is treated for chronic pain of the right shoulder.  She has COPD, continues to smoke, and uses oxygen as needed but she says she has been needing it more often than usual based on her shortness of breath.  She has no known history of heart disease and has never had a heart attack.  She said that the discomfort in her chest feels like a pressure that goes through to her back and it can be severe at times.  Nothing in particular makes her symptoms better or worse.  She denies fever, sore throat, nausea, vomiting, and abdominal pain.     Past Medical History:  Diagnosis Date   Arthritis    CHF (congestive heart failure) (HCC)    COPD (chronic obstructive pulmonary disease) (HCC)    Depression    GERD (gastroesophageal reflux disease)    Stroke [facility])     Patient Active Problem List   Diagnosis Date Noted   COPD exacerbation (HCC) 06/21/2021   HLD (hyperlipidemia) 06/21/2021   Acute on chronic respiratory failure with hypoxia (HCC) 06/21/2021   Chronic diastolic CHF (congestive heart failure) (HCC) 06/21/2021   Tobacco abuse 06/21/2021   Depression, recurrent (HCC) 03/20/2021   Memory loss of unknown cause 07/15/2020   MDD (major depressive disorder) 06/02/2019   Cigarette nicotine dependence without complication 04/07/2019    Major depressive disorder in full remission (HCC) 04/07/2019   HTN (hypertension) 12/26/2018   UTI (urinary tract infection) 11/04/2018   Chronic bilateral low back pain 05/09/2018   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 05/09/2018   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/31/2017   Other hyperlipidemia 10/17/2017   Chronic pain syndrome 09/03/2017   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   Lumbar post-laminectomy syndrome 09/03/2017   Chronic pruritus 01/04/2017   Vitamin B12 deficiency 01/01/2017   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 Authorizing Provider  ARIPiprazole (ABILIFY) 2 MG tablet Take 1 tablet (2 mg total) by mouth daily. Patient not taking: No sig reported 04/12/21   Sallyanne Kuster, NP  atorvastatin (LIPITOR) 40 MG tablet Take 1 tablet (40 mg total) by mouth daily. 03/16/21   Sallyanne Kuster, NP  citalopram (CELEXA) 40 MG tablet Take 1 tablet (40 mg total) by mouth daily. 03/16/21 03/16/22  Sallyanne Kuster, NP  Fluticasone-Umeclidin-Vilant (TRELEGY ELLIPTA) 100-62.5-25 MCG/INH AEPB Inhale 1 puff  into the lungs daily. 06/23/21   Almon Hercules, MD  Ipratropium-Albuterol (COMBIVENT) 20-100 MCG/ACT AERS respimat Inhale into the lungs. 06/07/18   [provider]  ipratropium-albuterol (DUONEB) 0.5-2.5 (3) MG/3ML SOLN Take 3 mLs by nebulization 2 (two) times a day as needed. 11/12/19   [place], Coralee North, NP  nicotine (NICODERM CQ - DOSED IN  MG/24 HOURS) 21 mg/24hr patch Place 1 patch (21 mg total) onto the skin daily. 06/23/21   Almon Hercules, MD  oxycodone (OXY-IR) 5 MG capsule Take 5 mg by mouth 2 (two) times daily as needed for pain.    [provider]  predniSONE (DELTASONE) 50 MG tablet Take 1 tablet (50 mg total) by mouth daily with breakfast. Patient not taking: No sig reported 06/24/21   Almon Hercules, MD  traZODone (DESYREL) 50 MG tablet Take 0.5-1 tablets (25-50 mg total) by mouth at bedtime as needed for sleep. 04/12/21   Sallyanne Kuster, NP    Allergies Bupropion and Penicillins  Family History  Family history unknown: Yes    Social History Social History   Tobacco Use   Smoking status: Every Day    Packs/day: 0.50    Types: Cigarettes   Smokeless tobacco: Never  Vaping Use   Vaping Use: Some days  Substance Use Topics   Alcohol use: Not Currently    Comment: occasionally   Drug use: Never    Review of Systems Constitutional: No fever/chills Eyes: No visual changes. ENT: No sore throat. Cardiovascular: Positive for chest pressure. Respiratory: Positive for shortness of breath Gastrointestinal: No abdominal pain.  No nausea, no vomiting.  No diarrhea.  No constipation. Genitourinary: Negative for dysuria. Musculoskeletal: Acute versus chronic right shoulder pain. Integumentary: Negative for rash. Neurological: Negative for headaches, focal weakness or numbness.   ____________________________________________   PHYSICAL EXAM:  ED Triage Vitals  Enc Vitals Group     BP 10/04/21 0138 (!) 154/84     Pulse Rate 10/04/21 0138 63     Resp 10/04/21 0138 18     Temp 10/04/21 0138 97.8 F (36.6 C)     Temp Source 10/04/21 0138 Oral     SpO2 10/04/21 0138 98 %     Weight 10/04/21 0137 73 kg (160 lb 15 oz)     Height 10/04/21 0137 1.6 m (5\' 3" )     Head Circumference --      Peak Flow --      Pain Score 10/04/21 0137 10     Pain Loc --      Pain Edu? --      Excl. in GC? --       Constitutional: Alert and oriented.  Eyes: Conjunctivae are normal.  Head: Atraumatic. Nose: No congestion/rhinnorhea. Mouth/Throat: Patient is wearing a mask. Neck: No stridor.  No meningeal signs.   Cardiovascular: Normal rate, regular rhythm. Good peripheral circulation. Respiratory: Normal respiratory effort.  No retractions. Gastrointestinal: Soft and nontender. No distention.  Musculoskeletal: No lower extremity tenderness nor edema. No gross deformities of extremities. Neurologic:  Normal speech and language. No gross focal neurologic deficits are appreciated.  Skin:  Skin is warm, dry and intact. Psychiatric: Mood and affect are normal. Speech and behavior are normal.  ____________________________________________   LABS (all labs ordered are listed, but only abnormal results are displayed)  Labs Reviewed  COMPREHENSIVE METABOLIC PANEL - Abnormal; Notable for the following components:      Result Value   Glucose, Bld 113 (*)    Calcium  8.8 (*)    Total Protein 6.2 (*)    All other components within normal limits  RESP PANEL BY RT-PCR (FLU A&B, COVID) ARPGX2  MAGNESIUM  LIPASE, BLOOD  CBC WITH DIFFERENTIAL/PLATELET  PROTIME-INR  TROPONIN I (HIGH SENSITIVITY)  TROPONIN I (HIGH SENSITIVITY)   ____________________________________________  EKG  ED ECG REPORT I, Loleta Rose, the attending physician, personally viewed and interpreted this ECG.  Date: 10/04/2021 EKG Time: 1:33 AM Rate: 69 Rhythm: Most probably atrial fibrillation, although somewhat difficult to appreciate QRS Axis: Left axis deviation Intervals: Abnormal due to atrial fibrillation, otherwise unremarkable ST/T Wave abnormalities: Non-specific ST segment / T-wave changes, but no clear evidence of acute ischemia. Narrative Interpretation: no definitive evidence of acute ischemia; does not meet STEMI criteria.  ____________________________________________  RADIOLOGY I, Loleta Rose, personally  viewed and evaluated these images (plain radiographs) as part of my medical decision making, as well as reviewing the written report by the radiologist.  ED MD interpretation: No acute abnormality identified on chest x-ray  Official radiology report(s): DG Chest Portable 1 View  Result Date: 10/04/2021 CLINICAL DATA:  Chest pain, right shoulder pain and shortness of breath. EXAM: PORTABLE CHEST 1 VIEW COMPARISON:  August 05, 2021 FINDINGS: Multiple overlying radiopaque cardiac lead wires are noted. Mild, stable, diffuse, chronic appearing increased lung markings are seen. There is no evidence of acute infiltrate, pleural effusion or pneumothorax. The heart size and mediastinal contours are within normal limits. There is moderate severity calcification of the aortic arch. Marked severity degenerative changes are seen involving both shoulders and throughout the thoracic spine. IMPRESSION: Stable exam without evidence of acute or active cardiopulmonary disease. Electronically Signed   By: Aram Candela M.D.   On: 10/04/2021 01:59    ____________________________________________   PROCEDURES   Procedure(s) performed (including Critical Care):  .1-3 Lead EKG Interpretation Performed by: Loleta Rose, MD Authorized by: Loleta Rose, MD     Interpretation: abnormal     ECG rate:  72   ECG rate assessment: normal     Rhythm: atrial fibrillation     Ectopy: none     Conduction: normal     ____________________________________________   INITIAL IMPRESSION / MDM / ASSESSMENT AND PLAN / ED COURSE  As part of my medical decision making, I reviewed the following data within the electronic MEDICAL RECORD NUMBER Nursing notes reviewed and incorporated, Labs reviewed , EKG interpreted , Old chart reviewed, Radiograph reviewed , and Notes from prior ED visits   Differential diagnosis includes, but is not limited to, COPD exacerbation, ACS, pneumonia, viral respiratory infection, PE,  pneumothorax.  Difficult to appreciate if this is new onset atrial fibrillation.  That is most consistent with her EKG findings although she has had EKGs in the past that looks similar although they have always been interpreted as sinus rhythm.  She is having new chest pressure but does not seem to be in respiratory distress and her symptoms are not consistent with COPD exacerbation.  She is at high risk for ACS.  EKG is nonischemic even though it is somewhat abnormal.  Lab work and chest x-ray are pending. The patient is on the cardiac monitor to evaluate for evidence of arrhythmia and/or significant heart rate changes.  Patient already received full dose aspirin in route to the hospital by EMS.     Clinical Course as of 10/04/21 0745  Tue Oct 04, 2021  0212 DG Chest Portable 1 View I personally reviewed the patient's imaging and agree with the  radiologist's interpretation that the patient has stable COPD without evidence of an acute issue. [CF]  [id number] Patient reports ongoing pain.  Giving morphine 4 mg IV and Zofran 4 mg IV.  Initial troponin is within normal limits.  Comprehensive metabolic panel, lipase, CBC, and coags are all within normal limits as well. [CF]  [phone] I was reviewing the medical record and saw that the patient went to [facility] emergency department yesterday for essentially the same complaint although she focus more on her shoulder pain.  She was discharged with recommendations to go to an outpatient pain clinic.  She has been resting comfortably.  When she awakened easily, she says she is feeling much better.  At this point there is no evidence of any acute or emergent medical condition.  Her symptoms could be the result of unstable angina but I think it is much more likely that she is suffering from pain associated with her chronic shoulder issues.  I suggest that she follow-up as an outpatient and she readily agreed with this plan.  I gave my usual and customary return  precautions. [CF]    Clinical Course User Index [CF] Loleta Rose, MD     ____________________________________________  FINAL CLINICAL IMPRESSION(S) / ED DIAGNOSES  Final diagnoses:  Chronic shoulder pain, unspecified laterality  Chest pain, unspecified type     MEDICATIONS GIVEN DURING THIS VISIT:  Medications  morphine 4 MG/ML injection 4 mg (4 mg Intravenous Given 10/04/21 0323)  ondansetron (ZOFRAN) injection 4 mg (4 mg Intravenous Given 10/04/21 0319)     ED Discharge Orders     None        Note:  This document was prepared using Dragon voice recognition software and may include unintentional dictation errors.   Loleta Rose, MD 10/04/21 [phone]

## 2021-10-21 ENCOUNTER — Telehealth: Payer: Self-pay

## 2021-10-21 NOTE — Telephone Encounter (Signed)
Completed medical records for [facility] faxed records to [phone]

## 2021-10-31 ENCOUNTER — Other Ambulatory Visit: Payer: Self-pay

## 2021-10-31 ENCOUNTER — Encounter: Payer: Self-pay | Admitting: Nurse Practitioner

## 2021-10-31 ENCOUNTER — Ambulatory Visit (INDEPENDENT_AMBULATORY_CARE_PROVIDER_SITE_OTHER): Payer: Medicare Other | Admitting: Nurse Practitioner

## 2021-10-31 VITALS — BP 104/71 | HR 91 | Temp 98.1°F | Ht 62.0 in | Wt 148.1 lb

## 2021-10-31 DIAGNOSIS — M25511 Pain in right shoulder: Secondary | ICD-10-CM | POA: Diagnosis not present

## 2021-10-31 DIAGNOSIS — F5101 Primary insomnia: Secondary | ICD-10-CM

## 2021-10-31 DIAGNOSIS — Z7689 Persons encountering health services in other specified circumstances: Secondary | ICD-10-CM

## 2021-10-31 DIAGNOSIS — F3342 Major depressive disorder, recurrent, in full remission: Secondary | ICD-10-CM

## 2021-10-31 DIAGNOSIS — J449 Chronic obstructive pulmonary disease, unspecified: Secondary | ICD-10-CM | POA: Diagnosis not present

## 2021-10-31 DIAGNOSIS — E782 Mixed hyperlipidemia: Secondary | ICD-10-CM

## 2021-10-31 DIAGNOSIS — G8929 Other chronic pain: Secondary | ICD-10-CM

## 2021-10-31 MED ORDER — TRAZODONE HCL 50 MG PO TABS: 25.0000 mg | ORAL_TABLET | Freq: Every evening | ORAL | 0 refills | Status: DC | PRN

## 2021-10-31 MED ORDER — MELOXICAM 15 MG PO TABS: 15.0000 mg | ORAL_TABLET | Freq: Every day | ORAL | 2 refills | Status: DC

## 2021-10-31 MED ORDER — DICLOFENAC SODIUM 1 % EX GEL: 4.0000 g | Freq: Four times a day (QID) | CUTANEOUS | 2 refills | Status: DC

## 2021-10-31 NOTE — Progress Notes (Signed)
New Patient Office Visit  Subjective:  Patient ID: Jennifer Hayes, female    DOB: June 20, 1941  Age: 81 y.o. MRN: [id number]  CC:  Chief Complaint  Patient presents with   New Patient (Initial Visit)    HPI Jennifer Hayes [facility] presents to establish new primary care provider. She does have significant right shoulder pain. Has appointment to see orthopedics and pain management coming up on 11/07/2021 in [place]. She does use voltaren gel as needed which helps the pain some.   She states that she is also having trouble sleeping. States that she tosses and turns most of the night. This has been problem for about two to three months. She has taken trazodone in the past. She feels like it worked well. Has not been on this for some time.   Past Medical History:  Diagnosis Date   Arthritis    CHF (congestive heart failure) (HCC)    COPD (chronic obstructive pulmonary disease) (HCC)    Depression    GERD (gastroesophageal reflux disease)    Stroke [facility])     Past Surgical History:  Procedure Laterality Date   APPENDECTOMY     BACK SURGERY      Family History  Family history unknown: Yes    Social History   Socioeconomic History   Marital status: Widowed    Spouse name: Not on file   Number of children: Not on file   Years of education: Not on file   Highest education level: Not on file  Occupational History   Not on file  Tobacco Use   Smoking status: Every Day    Packs/day: 0.50    Types: Cigarettes   Smokeless tobacco: Never  Vaping Use   Vaping Use: Some days  Substance and Sexual Activity   Alcohol use: Not Currently    Comment: occasionally   Drug use: Never   Sexual activity: Yes  Other Topics Concern   Not on file  Social History Narrative   She lives alone at home and has a cat that she adores.    Social Determinants of Health   Financial Resource Strain: Not on file  Food Insecurity: No Food Insecurity   Worried About Sales executive in the Last Year: Never true   Ran Out of Food in the Last Year: Never true  Transportation Needs: No Transportation Needs   Lack of Transportation (Medical): No   Lack of Transportation (Non-Medical): No  Physical Activity: Not on file  Stress: Not on file  Social Connections: Not on file  Intimate Partner Violence: Not on file    ROS Review of Systems  Constitutional:  Positive for fatigue. Negative for activity change, appetite change, chills and fever.  HENT:  Negative for congestion, postnasal drip, rhinorrhea, sinus pressure, sinus pain, sneezing and sore throat.   Eyes: Negative.   Respiratory:  Positive for shortness of breath and wheezing. Negative for cough and chest tightness.        Stable COPD   Cardiovascular:  Negative for chest pain and palpitations.  Gastrointestinal:  Negative for abdominal pain, constipation, diarrhea, nausea and vomiting.  Endocrine: Negative for cold intolerance, heat intolerance, polydipsia and polyuria.  Genitourinary:  Negative for dyspareunia, dysuria, flank pain, frequency and urgency.  Musculoskeletal:  Positive for arthralgias and myalgias. Negative for back pain.       Chronic right shoulder pain   Skin:  Negative for rash.  Allergic/Immunologic: Positive for environmental allergies.  Neurological:  Negative  for dizziness, weakness and headaches.  Hematological:  Negative for adenopathy.  Psychiatric/Behavioral:  Positive for dysphoric mood and sleep disturbance. The patient is nervous/anxious.    Objective:   Today's Vitals   10/31/21 1038  BP: 104/71  Pulse: 91  Temp: 98.1 F (36.7 C)  SpO2: 93%  Weight: 148 lb 1.9 oz (67.2 kg)  Height: 5\' 2"  (1.575 m)   Body mass index is 27.09 kg/m.   Physical Exam Vitals and nursing note reviewed.  Constitutional:      Appearance: Normal appearance. She is well-developed.  HENT:     Head: Normocephalic and atraumatic.     Mouth/Throat:     Mouth: Mucous membranes are moist.      Pharynx: Oropharynx is clear.  Eyes:     Extraocular Movements: Extraocular movements intact.     Conjunctiva/sclera: Conjunctivae normal.     Pupils: Pupils are equal, round, and reactive to light.  Neck:     Vascular: No carotid bruit.  Cardiovascular:     Rate and Rhythm: Normal rate and regular rhythm.     Pulses: Normal pulses.     Heart sounds: Normal heart sounds.  Pulmonary:     Effort: Pulmonary effort is normal.     Breath sounds: Wheezing and rhonchi present.     Comments: Clear with cough Abdominal:     Palpations: Abdomen is soft.  Musculoskeletal:        General: Normal range of motion.     Cervical back: Normal range of motion and neck supple.     Comments: Right shoulder pain with reduced range of motion and strength.  No bony abnormalities or deformities are noted at this time.  Lymphadenopathy:     Cervical: No cervical adenopathy.  Skin:    General: Skin is warm and dry.     Capillary Refill: Capillary refill takes less than 2 seconds.  Neurological:     General: No focal deficit present.     Mental Status: She is alert and oriented to person, place, and time.  Psychiatric:        Attention and Perception: Attention and perception normal.        Mood and Affect: Mood is anxious and depressed.        Speech: Speech normal.        Behavior: Behavior normal. Behavior is cooperative.        Thought Content: Thought content normal.        Cognition and Memory: Cognition and memory normal.        Judgment: Judgment normal.    Assessment & Plan:  1. Encounter to establish care Appointment today to establish new primary care provider.   2. Chronic right shoulder pain Restart meloxicam 15 mg tablets daily to take as needed.  May apply diclofenac gel up to 4 times daily as needed to help with pain and inflammation.  Keep annual appointment with pain management as scheduled. - meloxicam (MOBIC) 15 MG tablet; Take 1 tablet (15 mg total) by mouth daily.  Dispense:  30 tablet; Refill: 2 - diclofenac Sodium (VOLTAREN) 1 % GEL; Apply 4 g topically 4 (four) times daily.  Dispense: 100 g; Refill: 2  3. Chronic obstructive pulmonary disease, unspecified COPD type ([place]) Stable.  Continue inhalers and respiratory medications as prescribed.  4. Mixed hyperlipidemia Continue atorvastatin as prescribed.  Check fasting lipids at next visit and adjust dosing as indicated.  5. Recurrent major depressive disorder, in full remission ([place]) Stable.  Continue all medications for depression as previously prescribed.  6. Primary insomnia Start trazodone.  May take 1/2 to 1 tablet at bedtime as needed for insomnia.  We will reassess at next visit. - traZODone (DESYREL) 50 MG tablet; Take 0.5-1 tablets (25-50 mg total) by mouth at bedtime as needed for sleep.  Dispense: 90 tablet; Refill: 0   Problem List Items Addressed This Visit       Respiratory   Chronic obstructive pulmonary disease ([place])     Other   Insomnia   Relevant Medications   traZODone (DESYREL) 50 MG tablet   Major depressive disorder in full remission ([place])   Relevant Medications   traZODone (DESYREL) 50 MG tablet   HLD (hyperlipidemia)   Chronic right shoulder pain   Relevant Medications   traZODone (DESYREL) 50 MG tablet   meloxicam (MOBIC) 15 MG tablet   diclofenac Sodium (VOLTAREN) 1 % GEL   Other Visit Diagnoses     Encounter to establish care    -  Primary       Outpatient Encounter Medications as of 10/31/2021  Medication Sig   diclofenac Sodium (VOLTAREN) 1 % GEL Apply 4 g topically 4 (four) times daily.   meloxicam (MOBIC) 15 MG tablet Take 1 tablet (15 mg total) by mouth daily.   ARIPiprazole (ABILIFY) 2 MG tablet Take 1 tablet (2 mg total) by mouth daily. (Patient not taking: Reported on 06/28/2021)   atorvastatin (LIPITOR) 40 MG tablet Take 1 tablet (40 mg total) by mouth daily.   citalopram (CELEXA) 40 MG tablet Take 1 tablet (40 mg total) by mouth daily.    Fluticasone-Umeclidin-Vilant (TRELEGY ELLIPTA) 100-62.5-25 MCG/INH AEPB Inhale 1 puff into the lungs daily.   Ipratropium-Albuterol (COMBIVENT) 20-100 MCG/ACT AERS respimat Inhale into the lungs.   ipratropium-albuterol (DUONEB) 0.5-2.5 (3) MG/3ML SOLN Take 3 mLs by nebulization 2 (two) times a day as needed.   nicotine (NICODERM CQ - DOSED IN MG/24 HOURS) 21 mg/24hr patch Place 1 patch (21 mg total) onto the skin daily.   traZODone (DESYREL) 50 MG tablet Take 0.5-1 tablets (25-50 mg total) by mouth at bedtime as needed for sleep.   [DISCONTINUED] oxycodone (OXY-IR) 5 MG capsule Take 5 mg by mouth 2 (two) times daily as needed for pain.   [DISCONTINUED] predniSONE (DELTASONE) 50 MG tablet Take 1 tablet (50 mg total) by mouth daily with breakfast.   [DISCONTINUED] traZODone (DESYREL) 50 MG tablet Take 0.5-1 tablets (25-50 mg total) by mouth at bedtime as needed for sleep.   No facility-administered encounter medications on file as of 10/31/2021.    Follow-up: Return in about 6 weeks (around 12/12/2021) for medicare wellness, FBW at time of visit.   Ronnell Freshwater, NP  This note was dictated using Systems analyst. Rapid proofreading was performed to expedite the delivery of the information. Despite proofreading, phonetic errors will occur which are common with this voice recognition software. Please take this into consideration. If there are any concerns, please contact our office.

## 2021-11-06 DIAGNOSIS — M25511 Pain in right shoulder: Secondary | ICD-10-CM | POA: Insufficient documentation

## 2021-11-06 DIAGNOSIS — G8929 Other chronic pain: Secondary | ICD-10-CM | POA: Insufficient documentation

## 2021-12-12 ENCOUNTER — Other Ambulatory Visit: Payer: Self-pay

## 2021-12-12 DIAGNOSIS — Z0000 Encounter for general adult medical examination without abnormal findings: Secondary | ICD-10-CM

## 2021-12-12 DIAGNOSIS — Z130 Encounter for screening for diseases of the blood and blood-forming organs and certain disorders involving the immune mechanism: Secondary | ICD-10-CM

## 2021-12-13 ENCOUNTER — Ambulatory Visit: Admitting: Nurse Practitioner

## 2022-01-17 ENCOUNTER — Encounter: Payer: Self-pay | Admitting: Internal Medicine

## 2022-01-17 ENCOUNTER — Ambulatory Visit (INDEPENDENT_AMBULATORY_CARE_PROVIDER_SITE_OTHER): Payer: Medicare Other | Admitting: Internal Medicine

## 2022-01-17 VITALS — BP 126/83 | HR 84 | Ht 62.0 in | Wt 158.3 lb

## 2022-01-17 DIAGNOSIS — I5032 Chronic diastolic (congestive) heart failure: Secondary | ICD-10-CM

## 2022-01-17 DIAGNOSIS — M5416 Radiculopathy, lumbar region: Secondary | ICD-10-CM

## 2022-01-17 DIAGNOSIS — K2100 Gastro-esophageal reflux disease with esophagitis, without bleeding: Secondary | ICD-10-CM

## 2022-01-17 DIAGNOSIS — M12811 Other specific arthropathies, not elsewhere classified, right shoulder: Secondary | ICD-10-CM

## 2022-01-17 DIAGNOSIS — I10 Essential (primary) hypertension: Secondary | ICD-10-CM | POA: Diagnosis not present

## 2022-01-17 DIAGNOSIS — E782 Mixed hyperlipidemia: Secondary | ICD-10-CM | POA: Diagnosis not present

## 2022-01-17 DIAGNOSIS — E669 Obesity, unspecified: Secondary | ICD-10-CM

## 2022-01-17 DIAGNOSIS — F339 Major depressive disorder, recurrent, unspecified: Secondary | ICD-10-CM | POA: Diagnosis not present

## 2022-01-17 DIAGNOSIS — Z720 Tobacco use: Secondary | ICD-10-CM

## 2022-01-17 DIAGNOSIS — J431 Panlobular emphysema: Secondary | ICD-10-CM

## 2022-01-17 DIAGNOSIS — M5030 Other cervical disc degeneration, unspecified cervical region: Secondary | ICD-10-CM

## 2022-01-17 DIAGNOSIS — F3342 Major depressive disorder, recurrent, in full remission: Secondary | ICD-10-CM

## 2022-01-17 DIAGNOSIS — R413 Other amnesia: Secondary | ICD-10-CM

## 2022-01-17 MED ORDER — CITALOPRAM HYDROBROMIDE 40 MG PO TABS: 40.0000 mg | ORAL_TABLET | Freq: Every day | ORAL | 3 refills | Status: DC

## 2022-01-17 MED ORDER — CELECOXIB 200 MG PO CAPS: 200.0000 mg | ORAL_CAPSULE | Freq: Every day | ORAL | 3 refills | Status: DC

## 2022-01-17 MED ORDER — ATORVASTATIN CALCIUM 40 MG PO TABS: 40.0000 mg | ORAL_TABLET | Freq: Every day | ORAL | 3 refills | Status: DC

## 2022-01-17 NOTE — Assessment & Plan Note (Signed)
Stable

## 2022-01-17 NOTE — Assessment & Plan Note (Signed)
-   I instructed the patient to stop smoking and provided them with smoking cessation materials.  - I informed the patient that smoking puts them at increased risk for cancer, COPD, hypertension, and more.  - Informed the patient to seek help if they begin to have trouble breathing, develop chest pain, start to cough up blood, feel faint, or pass out.  

## 2022-01-17 NOTE — Progress Notes (Signed)
Lose bm ? ?Established Patient Office Visit ? ?Subjective:  ?Patient ID: Jennifer Hayes, female    DOB: 07/01/1941  Age: 81 y.o. MRN: [id number] ? ?CC:  ?Chief ComplaintHetty Hayes  ?Patient presents with  ? New Patient (Initial Visit)  ?  Patient has complaints of having loss of bowel control that first started around 2 months ago..   ? ? ?HPI ? ?Jennifer Hayes presents for loose bm, pt dont feel good ? ?Past Medical History:  ?Diagnosis Date  ? Arthritis   ? CHF (congestive heart failure) (HCC)   ? COPD (chronic obstructive pulmonary disease) (HCC)   ? Depression   ? GERD (gastroesophageal reflux disease)   ? Stroke [facility](HCC)   ? ? ?Past Surgical History:  ?Procedure Laterality Date  ? APPENDECTOMY    ? BACK SURGERY    ? ? ?Family History  ?Family history unknown: Yes  ? ? ?Social History  ? ?Socioeconomic History  ? Marital status: Widowed  ?  Spouse name: Not on file  ? Number of children: Not on file  ? Years of education: Not on file  ? Highest education level: Not on file  ?Occupational History  ? Not on file  ?Tobacco Use  ? Smoking status: Every Day  ?  Packs/day: 0.50  ?  Types: Cigarettes  ? Smokeless tobacco: Never  ?Vaping Use  ? Vaping Use: Some days  ?Substance and Sexual Activity  ? Alcohol use: Not Currently  ?  Comment: occasionally  ? Drug use: Never  ? Sexual activity: Yes  ?Other Topics Concern  ? Not on file  ?Social History Narrative  ? She lives alone at home and has a cat that she adores.   ? ?Social Determinants of Health  ? ?Financial Resource Strain: Not on file  ?Food Insecurity: No Food Insecurity  ? Worried About Programme researcher, broadcasting/film/videounning Out of Food in the Last Year: Never true  ? Ran Out of Food in the Last Year: Never true  ?Transportation Needs: No Transportation Needs  ? Lack of Transportation (Medical): No  ? Lack of Transportation (Non-Medical): No  ?Physical Activity: Not on file  ?Stress: Not on file  ?Social Connections: Not on file  ?Intimate Partner Violence: Not on file  ? ? ? ?Current  Outpatient Medications:  ?  ARIPiprazole (ABILIFY) 2 MG tablet, Take 1 tablet (2 mg total) by mouth daily., Disp: 30 tablet, Rfl: 0 ?  diclofenac Sodium (VOLTAREN) 1 % GEL, Apply 4 g topically 4 (four) times daily., Disp: 100 g, Rfl: 2 ?  Fluticasone-Umeclidin-Vilant (TRELEGY ELLIPTA) 100-62.5-25 MCG/INH AEPB, Inhale 1 puff into the lungs daily., Disp: 1 each, Rfl: 11 ?  Ipratropium-Albuterol (COMBIVENT) 20-100 MCG/ACT AERS respimat, Inhale into the lungs., Disp: , Rfl:  ?  ipratropium-albuterol (DUONEB) 0.5-2.5 (3) MG/3ML SOLN, Take 3 mLs by nebulization 2 (two) times a day as needed., Disp: 360 mL, Rfl: 3 ?  traZODone (DESYREL) 50 MG tablet, Take 0.5-1 tablets (25-50 mg total) by mouth at bedtime as needed for sleep., Disp: 90 tablet, Rfl: 0 ?  atorvastatin (LIPITOR) 40 MG tablet, Take 1 tablet (40 mg total) by mouth daily., Disp: 90 tablet, Rfl: 3 ?  celecoxib (CELEBREX) 200 MG capsule, Take 1 capsule (200 mg total) by mouth daily at 2 PM., Disp: 90 capsule, Rfl: 3 ?  citalopram (CELEXA) 40 MG tablet, Take 1 tablet (40 mg total) by mouth daily., Disp: 90 tablet, Rfl: 3  ? ?Allergies  ?Allergen Reactions  ? Bupropion Swelling  ?  headache  ? Penicillins Hives  ? ? ?ROS ?Review of Systems  ?Constitutional: Negative.   ?HENT: Negative.    ?Eyes: Negative.   ?Respiratory: Negative.    ?Cardiovascular: Negative.   ?Gastrointestinal: Negative.   ?Endocrine: Negative.   ?Genitourinary: Negative.   ?Musculoskeletal: Negative.   ?Skin: Negative.   ?Allergic/Immunologic: Negative.   ?Neurological: Negative.   ?Hematological: Negative.   ?Psychiatric/Behavioral: Negative.    ?All other systems reviewed and are negative. ? ?  ?Objective:  ?  ?Physical Exam ?Constitutional:   ?   Appearance: She is obese. She is not ill-appearing.  ?HENT:  ?   Head: Normocephalic.  ?   Nose: Nose normal.  ?Eyes:  ?   Pupils: Pupils are equal, round, and reactive to light.  ?Cardiovascular:  ?   Rate and Rhythm: Normal rate.  ?Pulmonary:  ?    Breath sounds: Wheezing and rhonchi present.  ?Skin: ?   General: Skin is warm.  ?Neurological:  ?   General: No focal deficit present.  ?Psychiatric:     ?   Mood and Affect: Mood normal.  ? ? ?BP 126/83   Pulse 84   Ht 5\' 2"  (1.575 m)   Wt 158 lb 4.8 oz (71.8 kg)   BMI 28.95 kg/m?  ?Wt Readings from Last 3 Encounters:  ?01/17/22 158 lb 4.8 oz (71.8 kg)  ?10/31/21 148 lb 1.9 oz (67.2 kg)  ?10/04/21 160 lb 15 oz (73 kg)  ? ? ? ?Health Maintenance Due  ?Topic Date Due  ? Pneumonia Vaccine 42+ Years old (1 - PCV) Never done  ? Hepatitis C Screening  Never done  ? Zoster Vaccines- Shingrix (1 of 2) Never done  ? COVID-19 Vaccine (4 - Booster for Moderna series) 12/01/2020  ? ? ?There are no preventive care reminders to display for this patient. ? ?Lab Results  ?Component Value Date  ? TSH 1.020 09/15/2020  ? ?Lab Results  ?Component Value Date  ? WBC 8.6 10/04/2021  ? HGB 13.3 10/04/2021  ? HCT 38.8 10/04/2021  ? MCV 87.0 10/04/2021  ? PLT 170 10/04/2021  ? ?Lab Results  ?Component Value Date  ? NA 138 10/04/2021  ? K 3.5 10/04/2021  ? CO2 26 10/04/2021  ? GLUCOSE 113 (H) 10/04/2021  ? BUN 14 10/04/2021  ? CREATININE 0.62 10/04/2021  ? BILITOT 0.5 10/04/2021  ? ALKPHOS 75 10/04/2021  ? AST 15 10/04/2021  ? ALT 14 10/04/2021  ? PROT 6.2 (L) 10/04/2021  ? ALBUMIN 3.6 10/04/2021  ? CALCIUM 8.8 (L) 10/04/2021  ? ANIONGAP 6 10/04/2021  ? ?Lab Results  ?Component Value Date  ? CHOL 211 (H) 07/15/2020  ? ?Lab Results  ?Component Value Date  ? HDL 45 (L) 07/15/2020  ? ?Lab Results  ?Component Value Date  ? LDLCALC 136 (H) 07/15/2020  ? ?Lab Results  ?Component Value Date  ? TRIG 164 (H) 07/15/2020  ? ?Lab Results  ?Component Value Date  ? CHOLHDL 4.7 07/15/2020  ? ?Lab Results  ?Component Value Date  ? HGBA1C 5.5 06/05/2019  ? ? ?  ?Assessment & Plan:  ? ?Problem List Items Addressed This Visit   ? ?  ? Cardiovascular and Mediastinum  ? HTN (hypertension) - Primary  ?  Blood pressure is under control ?  ?  ? Relevant  Medications  ? atorvastatin (LIPITOR) 40 MG tablet  ? Chronic diastolic CHF (congestive heart failure) (HCC)  ? Relevant Medications  ? atorvastatin (LIPITOR) 40 MG tablet  ?  ?  Respiratory  ? Panlobular emphysema (HCC)  ?  Patient was advised to quit smoking, she still has wheezing bilaterally in the chest ?  ?  ?  ? Digestive  ? GERD (gastroesophageal reflux disease)  ?  - The patient's GERD is stable on medication.  ?- Instructed the patient to avoid eating spicy and acidic foods, as well as foods high in fat. ?- Instructed the patient to avoid eating large meals or meals 2-3 hours prior to sleeping. ?  ?  ?  ? Nervous and Auditory  ? Lumbar neuritis  ?  She is not having any sciatica ?  ?  ? Relevant Medications  ? citalopram (CELEXA) 40 MG tablet  ?  ? Musculoskeletal and Integument  ? DDD (degenerative disc disease), cervical  ?  Stable ?  ?  ? Relevant Medications  ? celecoxib (CELEBREX) 200 MG capsule  ? Rotator cuff arthropathy, right  ?  Stable ?  ?  ?  ? Other  ? 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 Relevant Medications  ? citalopram (CELEXA) 40 MG tablet  ? Obesity, Class I, BMI 30-34.9  ? Memory loss of unknown cause  ?  Stable ?  ?  ? Depression, recurrent (HCC)  ? Relevant Medications  ? citalopram (CELEXA) 40 MG tablet  ? HLD (hyperlipidemia)  ? Relevant Medications  ? atorvastatin (LIPITOR) 40 MG tablet  ? Tobacco abuse  ?  - I instructed the patient to stop smoking and provided them with smoking cessation materials.  ?- I informed the patient that smoking puts them at increased risk for cancer, COPD, hypertension, and more.  ?- Informed the patient to seek help if they begin to have trouble breathing, develop chest pain, start to cough up blood, feel faint, or pass out. ?  ?  ? ? ?Meds ordered this encounter  ?Medications  ? atorvastatin (LIPITOR) 40 MG tablet  ?  Sig: Take 1 tablet (40 mg total) by mouth daily.  ?  Dispense:  90 tablet  ?  Refill:  3  ? citalopram (CELEXA) 40 MG  tablet  ?  Sig: Take 1 tablet (40 mg total) by mouth daily.  ?  Dispense:  90 tablet  ?  Refill:  3  ? celecoxib (CELEBREX) 200 MG capsule  ?  Sig: Take 1 capsule (200 mg total) by mouth daily at 2 PM.  ?  Dispen

## 2022-01-17 NOTE — Assessment & Plan Note (Signed)
Blood pressure is under control 

## 2022-01-17 NOTE — Assessment & Plan Note (Signed)
She is not having any sciatica ?

## 2022-01-17 NOTE — Assessment & Plan Note (Signed)
-   The patient's GERD is stable on medication.  - Instructed the patient to avoid eating spicy and acidic foods, as well as foods high in fat. - Instructed the patient to avoid eating large meals or meals 2-3 hours prior to sleeping. 

## 2022-01-17 NOTE — Assessment & Plan Note (Signed)
Patient was advised to quit smoking, she still has wheezing bilaterally in the chest ?

## 2022-02-02 ENCOUNTER — Other Ambulatory Visit: Payer: Self-pay | Admitting: *Deleted

## 2022-02-02 DIAGNOSIS — F339 Major depressive disorder, recurrent, unspecified: Secondary | ICD-10-CM

## 2022-02-02 MED ORDER — CITALOPRAM HYDROBROMIDE 40 MG PO TABS: 40.0000 mg | ORAL_TABLET | Freq: Every day | ORAL | 0 refills | Status: DC

## 2022-02-02 MED ORDER — CELECOXIB 200 MG PO CAPS: 200.0000 mg | ORAL_CAPSULE | Freq: Every day | ORAL | 0 refills | Status: DC

## 2022-02-15 ENCOUNTER — Ambulatory Visit: Admitting: Internal Medicine

## 2022-03-24 ENCOUNTER — Encounter: Payer: Self-pay | Admitting: Nurse Practitioner

## 2022-03-24 ENCOUNTER — Ambulatory Visit (INDEPENDENT_AMBULATORY_CARE_PROVIDER_SITE_OTHER): Payer: Medicare Other | Admitting: Nurse Practitioner

## 2022-03-24 VITALS — BP 118/71 | HR 76 | Ht 62.0 in | Wt 154.9 lb

## 2022-03-24 DIAGNOSIS — G4700 Insomnia, unspecified: Secondary | ICD-10-CM

## 2022-03-24 DIAGNOSIS — R5383 Other fatigue: Secondary | ICD-10-CM | POA: Diagnosis not present

## 2022-03-24 DIAGNOSIS — E7849 Other hyperlipidemia: Secondary | ICD-10-CM | POA: Diagnosis not present

## 2022-03-24 DIAGNOSIS — E538 Deficiency of other specified B group vitamins: Secondary | ICD-10-CM

## 2022-03-24 DIAGNOSIS — E782 Mixed hyperlipidemia: Secondary | ICD-10-CM

## 2022-03-24 DIAGNOSIS — F339 Major depressive disorder, recurrent, unspecified: Secondary | ICD-10-CM

## 2022-03-24 NOTE — Progress Notes (Unsigned)
Established Patient Office Visit  Subjective:  Patient ID: Jennifer Hayes, female    DOB: April 13, 1941  Age: 81 y.o. MRN: [id number]  CC:  Chief Complaint  Patient presents with   Follow-up    Patient has been having difficulty sleeping. Patient states that she tosses and turns and then once she does falls asleep she wakes up in just a few hours.      HPI  Jennifer Hayes presents for insomnia.   HPI   Past Medical History:  Diagnosis Date   Arthritis    CHF (congestive heart failure) (HCC)    COPD (chronic obstructive pulmonary disease) (HCC)    Depression    GERD (gastroesophageal reflux disease)    Stroke [facility])     Past Surgical History:  Procedure Laterality Date   APPENDECTOMY     BACK SURGERY      Family History  Family history unknown: Yes    Social History   Socioeconomic History   Marital status: Widowed    Spouse name: Not on file   Number of children: Not on file   Years of education: Not on file   Highest education level: Not on file  Occupational History   Not on file  Tobacco Use   Smoking status: Every Day    Packs/day: 0.50    Types: Cigarettes   Smokeless tobacco: Never  Vaping Use   Vaping Use: Some days  Substance and Sexual Activity   Alcohol use: Not Currently    Comment: occasionally   Drug use: Never   Sexual activity: Yes  Other Topics Concern   Not on file  Social History Narrative   She lives alone at home and has a cat that she adores.    Social Determinants of Health   Financial Resource Strain: Not on file  Food Insecurity: No Food Insecurity (06/28/2021)   Hunger Vital Sign    Worried About Running Out of Food in the Last Year: Never true    Ran Out of Food in the Last Year: Never true  Transportation Needs: No Transportation Needs (06/28/2021)   PRAPARE - Administrator, Civil Service (Medical): No    Lack of Transportation (Non-Medical): No  Physical Activity: Not on file   Stress: Not on file  Social Connections: Not on file  Intimate Partner Violence: Not on file     Outpatient Medications Prior to Visit  Medication Sig Dispense Refill   atorvastatin (LIPITOR) 40 MG tablet Take 1 tablet (40 mg total) by mouth daily. 90 tablet 3   celecoxib (CELEBREX) 200 MG capsule Take 1 capsule (200 mg total) by mouth daily at 2 PM. 30 capsule 0   citalopram (CELEXA) 40 MG tablet Take 1 tablet (40 mg total) by mouth daily. 30 tablet 0   esomeprazole (NEXIUM) 40 MG capsule Take 40 mg by mouth daily at 12 noon.     Fluticasone-Umeclidin-Vilant (TRELEGY ELLIPTA) 100-62.5-25 MCG/INH AEPB Inhale 1 puff into the lungs daily. 1 each 11   Ipratropium-Albuterol (COMBIVENT) 20-100 MCG/ACT AERS respimat Inhale into the lungs.     ipratropium-albuterol (DUONEB) 0.5-2.5 (3) MG/3ML SOLN Take 3 mLs by nebulization 2 (two) times a day as needed. 360 mL 3   traZODone (DESYREL) 50 MG tablet Take 0.5-1 tablets (25-50 mg total) by mouth at bedtime as needed for sleep. (Patient not taking: Reported on 03/24/2022) 90 tablet 0   ARIPiprazole (ABILIFY) 2 MG tablet Take 1 tablet (2 mg total) by mouth  daily. 30 tablet 0   diclofenac Sodium (VOLTAREN) 1 % GEL Apply 4 g topically 4 (four) times daily. 100 g 2   No facility-administered medications prior to visit.    Allergies  Allergen Reactions   Bupropion Swelling    headache   Penicillins Hives    ROS Review of Systems  Constitutional:  Positive for fatigue.      Objective:    Physical Exam  BP 118/71   Pulse 76   Ht 5\' 2"  (1.575 m)   Wt 154 lb 14.4 oz (70.3 kg)   BMI 28.33 kg/m  Wt Readings from Last 3 Encounters:  03/24/22 154 lb 14.4 oz (70.3 kg)  01/17/22 158 lb 4.8 oz (71.8 kg)  10/31/21 148 lb 1.9 oz (67.2 kg)     Health Maintenance Due  Topic Date Due   Pneumonia Vaccine 17+ Years old (1 - PCV) Never done   Hepatitis C Screening  Never done   Zoster Vaccines- Shingrix (1 of 2) Never done   COVID-19 Vaccine (4  - Moderna series) 12/01/2020    There are no preventive care reminders to display for this patient.  Lab Results  Component Value Date   TSH 1.020 09/15/2020   Lab Results  Component Value Date   WBC 8.6 10/04/2021   HGB 13.3 10/04/2021   HCT 38.8 10/04/2021   MCV 87.0 10/04/2021   PLT 170 10/04/2021   Lab Results  Component Value Date   NA 138 10/04/2021   K 3.5 10/04/2021   CO2 26 10/04/2021   GLUCOSE 113 (H) 10/04/2021   BUN 14 10/04/2021   CREATININE 0.62 10/04/2021   BILITOT 0.5 10/04/2021   ALKPHOS 75 10/04/2021   AST 15 10/04/2021   ALT 14 10/04/2021   PROT 6.2 (L) 10/04/2021   ALBUMIN 3.6 10/04/2021   CALCIUM 8.8 (L) 10/04/2021   ANIONGAP 6 10/04/2021   Lab Results  Component Value Date   CHOL 211 (H) 07/15/2020   Lab Results  Component Value Date   HDL 45 (L) 07/15/2020   Lab Results  Component Value Date   LDLCALC 136 (H) 07/15/2020   Lab Results  Component Value Date   TRIG 164 (H) 07/15/2020   Lab Results  Component Value Date   CHOLHDL 4.7 07/15/2020   Lab Results  Component Value Date   HGBA1C 5.5 06/05/2019      Assessment & Plan:   Problem List Items Addressed This Visit   None    No orders of the defined types were placed in this encounter.    Follow-up: No follow-ups on file.    06/07/2019, NP

## 2022-03-25 LAB — COMPLETE METABOLIC PANEL WITH GFR
AG Ratio: 2 (calc) (ref 1.0–2.5)
ALT: 8 U/L (ref 6–29)
AST: 9 U/L — ABNORMAL LOW (ref 10–35)
Albumin: 4.1 g/dL (ref 3.6–5.1)
Alkaline phosphatase (APISO): 98 U/L (ref 37–153)
BUN/Creatinine Ratio: 23 (calc) — ABNORMAL HIGH (ref 6–22)
BUN: 13 mg/dL (ref 7–25)
CO2: 22 mmol/L (ref 20–32)
Calcium: 9.2 mg/dL (ref 8.6–10.4)
Chloride: 105 mmol/L (ref 98–110)
Creat: 0.57 mg/dL — ABNORMAL LOW (ref 0.60–1.00)
Globulin: 2.1 g/dL (calc) (ref 1.9–3.7)
Glucose, Bld: 83 mg/dL (ref 65–99)
Potassium: 4 mmol/L (ref 3.5–5.3)
Sodium: 139 mmol/L (ref 135–146)
Total Bilirubin: 0.7 mg/dL (ref 0.2–1.2)
Total Protein: 6.2 g/dL (ref 6.1–8.1)
eGFR: 92 mL/min/{1.73_m2} (ref >=60)

## 2022-03-25 LAB — CBC WITH DIFFERENTIAL/PLATELET
Absolute Monocytes: 470 cells/uL (ref 200–950)
Basophils Absolute: 59 cells/uL (ref 0–200)
Basophils Relative: 0.7 %
Eosinophils Absolute: 294 cells/uL (ref 15–500)
Eosinophils Relative: 3.5 %
HCT: 43.1 % (ref 35.0–45.0)
Hemoglobin: 14.6 g/dL (ref 11.7–15.5)
Lymphs Abs: 2764 cells/uL (ref 850–3900)
MCH: 31.5 pg (ref 27.0–33.0)
MCHC: 33.9 g/dL (ref 32.0–36.0)
MCV: 92.9 fL (ref 80.0–100.0)
MPV: 10.7 fL (ref 7.5–12.5)
Monocytes Relative: 5.6 %
Neutro Abs: 4813 cells/uL (ref 1500–7800)
Neutrophils Relative %: 57.3 %
Platelets: 215 10*3/uL (ref 140–400)
RBC: 4.64 10*6/uL (ref 3.80–5.10)
RDW: 12.4 % (ref 11.0–15.0)
Total Lymphocyte: 32.9 %
WBC: 8.4 10*3/uL (ref 3.8–10.8)

## 2022-03-25 LAB — TSH: TSH: 1.35 mIU/L (ref 0.40–4.50)

## 2022-03-25 LAB — VITAMIN B12: Vitamin B-12: 224 pg/mL (ref 200–1100)

## 2022-03-25 LAB — LIPID PANEL
Cholesterol: 165 mg/dL (ref <200)
HDL: 40 mg/dL — ABNORMAL LOW (ref >=50)
LDL Cholesterol (Calc): 100 mg/dL (calc) — ABNORMAL HIGH
Non-HDL Cholesterol (Calc): 125 mg/dL (calc) (ref <130)
Total CHOL/HDL Ratio: 4.1 (calc) (ref <5.0)
Triglycerides: 151 mg/dL — ABNORMAL HIGH (ref <150)

## 2022-03-29 NOTE — Assessment & Plan Note (Signed)
Patient complains of being fatigued. Lab order for vitamin B12 deficiency.

## 2022-03-29 NOTE — Assessment & Plan Note (Signed)
Advised patient to take trazodone 50 mg half an hour before bedtime. Advised patient to avoid screen time 1 to 2-hour before going to sleep. Follow a good sleep hygiene. We will continue to monitor.

## 2022-03-29 NOTE — Assessment & Plan Note (Signed)
Stable on medication

## 2022-03-29 NOTE — Assessment & Plan Note (Signed)
Continue Lipitor 40 mg daily for cardiovascular risk reduction. Lab ordered.

## 2022-04-20 ENCOUNTER — Ambulatory Visit

## 2022-04-28 ENCOUNTER — Ambulatory Visit (INDEPENDENT_AMBULATORY_CARE_PROVIDER_SITE_OTHER): Payer: Medicare Other | Admitting: *Deleted

## 2022-04-28 DIAGNOSIS — Z0000 Encounter for general adult medical examination without abnormal findings: Secondary | ICD-10-CM | POA: Diagnosis not present

## 2022-04-28 DIAGNOSIS — F5101 Primary insomnia: Secondary | ICD-10-CM

## 2022-04-28 MED ORDER — TRAZODONE HCL 50 MG PO TABS: 25.0000 mg | ORAL_TABLET | Freq: Every evening | ORAL | 0 refills | Status: DC | PRN

## 2022-04-28 NOTE — Progress Notes (Cosign Needed)
Subjective:   Jennifer Hayes is a 81 y.o. female who presents for an Initial Medicare Annual Wellness Visit.  I discussed the limitations of evaluation and management by telemedicine and the availability of in person appointments. Patient expressed understanding and agreed to proceed.   Visit performed using audio  Patient:home Provider:home   Review of Systems    Defer to provider Cardiac Risk Factors include: advanced age (>37men, >38 women);smoking/ tobacco exposure     Objective:    There were no vitals filed for this visit. There is no height or weight on file to calculate BMI.     10/04/2021    4:06 AM 06/21/2021    4:35 PM 03/09/2020    3:13 PM 03/09/2020    2:35 PM 01/27/2020    3:36 PM 12/14/2019    1:33 PM 06/06/2015    5:33 PM  Advanced Directives  Does Patient Have a Medical Advance Directive? No No  No No Yes Yes  Type of Advance Directive      Living will Living will  Does patient want to make changes to medical advance directive?       No - Patient declined  Copy of Healthcare Power of Attorney in Chart?       No - copy requested  Would patient like information on creating a medical advance directive?  No - Patient declined No - Patient declined        Current Medications (verified) Outpatient Encounter Medications as of 04/28/2022  Medication Sig   atorvastatin (LIPITOR) 40 MG tablet Take 1 tablet (40 mg total) by mouth daily.   celecoxib (CELEBREX) 200 MG capsule Take 1 capsule (200 mg total) by mouth daily at 2 PM.   citalopram (CELEXA) 40 MG tablet Take 1 tablet (40 mg total) by mouth daily.   esomeprazole (NEXIUM) 40 MG capsule Take 40 mg by mouth daily at 12 noon.   Fluticasone-Umeclidin-Vilant (TRELEGY ELLIPTA) 100-62.5-25 MCG/INH AEPB Inhale 1 puff into the lungs daily.   Ipratropium-Albuterol (COMBIVENT) 20-100 MCG/ACT AERS respimat Inhale into the lungs.   ipratropium-albuterol (DUONEB) 0.5-2.5 (3) MG/3ML SOLN Take 3 mLs by nebulization 2  (two) times a day as needed.   traZODone (DESYREL) 50 MG tablet Take 0.5-1 tablets (25-50 mg total) by mouth at bedtime as needed for sleep.   [DISCONTINUED] traZODone (DESYREL) 50 MG tablet Take 0.5-1 tablets (25-50 mg total) by mouth at bedtime as needed for sleep. (Patient not taking: Reported on 03/24/2022)   No facility-administered encounter medications on file as of 04/28/2022.    Allergies (verified) Bupropion and Penicillins   History: Past Medical History:  Diagnosis Date   Arthritis    CHF (congestive heart failure) (HCC)    COPD (chronic obstructive pulmonary disease) (HCC)    Depression    GERD (gastroesophageal reflux disease)    Stroke [facility])    Past Surgical History:  Procedure Laterality Date   APPENDECTOMY     BACK SURGERY     Family History  Family history unknown: Yes   Social History   Socioeconomic History   Marital status: Widowed    Spouse name: Not on file   Number of children: Not on file   Years of education: Not on file   Highest education level: Not on file  Occupational History   Not on file  Tobacco Use   Smoking status: Every Day    Packs/day: 0.50    Types: Cigarettes   Smokeless tobacco: Never  Vaping Use  Vaping Use: Some days  Substance and Sexual Activity   Alcohol use: Not Currently    Comment: occasionally   Drug use: Never   Sexual activity: Yes  Other Topics Concern   Not on file  Social History Narrative   She lives alone at home and has a cat that she adores.    Social Determinants of Health   Financial Resource Strain: Low Risk  (04/28/2022)   Overall Financial Resource Strain (CARDIA)    Difficulty of Paying Living Expenses: Not hard at all  Food Insecurity: No Food Insecurity (04/28/2022)   Hunger Vital Sign    Worried About Running Out of Food in the Last Year: Never true    Ran Out of Food in the Last Year: Never true  Transportation Needs: No Transportation Needs (04/28/2022)   PRAPARE - Therapist, art (Medical): No    Lack of Transportation (Non-Medical): No  Physical Activity: Inactive (04/28/2022)   Exercise Vital Sign    Days of Exercise per Week: 0 days    Minutes of Exercise per Session: 0 min  Stress: No Stress Concern Present (04/28/2022)   [employer] of Occupational Health - Occupational Stress Questionnaire    Feeling of Stress : Only a little  Social Connections: Moderately Integrated (04/28/2022)   Social Connection and Isolation Panel [NHANES]    Frequency of Communication with Friends and Family: More than three times a week    Frequency of Social Gatherings with Friends and Family: More than three times a week    Attends Religious Services: More than 4 times per year    Active Member of [employer] or Organizations: Yes    Attends Banker Meetings: 1 to 4 times per year    Marital Status: Widowed    Tobacco Counseling Ready to quit: Not Answered Counseling given: Not Answered   Clinical Intake:  Pre-visit preparation completed: Yes  Pain : No/denies pain     Nutritional Risks: None Diabetes: No  How often do you need to have someone help you when you read instructions, pamphlets, or other written materials from your doctor or pharmacy?: 1 - Never What is the last grade level you completed in school?: 12  Diabetic?no  Interpreter Needed?: No  Information entered by :: Melody Comas, CMA   Activities of Daily Living    04/28/2022    1:38 PM 10/31/2021   10:42 AM  In your present state of health, do you have any difficulty performing the following activities:  Hearing? 0 0  Vision? 0 0  Difficulty concentrating or making decisions? 0 0  Walking or climbing stairs? 0 0  Dressing or bathing? 0 0  Doing errands, shopping? 0 0  Preparing Food and eating ? N   Using the Toilet? N   In the past six months, have you accidently leaked urine? N   Do you have problems with loss of bowel control? N   Managing your  Medications? N   Managing your Finances? N   Housekeeping or managing your Housekeeping? N     Patient Care Team: Corky Downs, MD as PCP - General (Internal Medicine)  Indicate any recent Medical Services you may have received from other than [facility] providers in the past year (date may be approximate).     Assessment:   This is a routine wellness examination for [place].  Hearing/Vision screen No results found.  Dietary issues and exercise activities discussed: Current Exercise Habits: The patient  does not participate in regular exercise at present, Exercise limited by: orthopedic condition(s)   Goals Addressed   None    Depression Screen    04/28/2022   10:05 AM 03/24/2022   11:30 AM 03/24/2022   11:29 AM 10/31/2021   10:41 AM 04/12/2021    9:48 AM 12/21/2020   10:15 AM 09/15/2020   10:22 AM  PHQ 2/9 Scores  PHQ - 2 Score 0 0 0 0 0 0 0  PHQ- 9 Score 0 3  2       Fall Risk    04/28/2022    1:38 PM 10/31/2021   10:42 AM 04/12/2021    9:48 AM 12/21/2020   10:15 AM 09/15/2020   10:22 AM  Fall Risk   Falls in the past year? 0 0 0 0 1  Number falls in past yr: 0 0   0  Injury with Fall? 0 0   0  Risk for fall due to : No Fall Risks   No Fall Risks   Follow up Falls evaluation completed Falls evaluation completed  Falls evaluation completed     [place]:  Any stairs in or around the home? No  If so, are there any without handrails? No  Home free of loose throw rugs in walkways, pet beds, electrical cords, etc? Yes  Adequate lighting in your home to reduce risk of falls? Yes   ASSISTIVE DEVICES UTILIZED TO PREVENT FALLS:  Life alert? No  Use of a cane, walker or w/c? No  Grab bars in the bathroom? No  Shower chair or bench in shower? No  Elevated toilet seat or a handicapped toilet? No   TIMED UP AND GO:  Was the test performed? No .  Length of time to ambulate-na    Cognitive Function:    04/28/2022    1:39 PM  MMSE - Mini  Mental State Exam  Not completed: Unable to complete        04/28/2022    1:39 PM  6CIT Screen  What Year? 0 points  What month? 0 points  What time? 0 points  Count back from 20 0 points  Months in reverse 0 points  Repeat phrase 0 points  Total Score 0 points    Immunizations Immunization History  Administered Date(s) Administered   Influenza Inj Mdck Quad Pf 11/23/2016   Influenza Inj Mdck Quad With Preservative 11/23/2016   Influenza, High Dose Seasonal PF 06/02/2015   Influenza,inj,Quad PF,6+ Mos 11/23/2016   Influenza-Unspecified 11/23/2016   Moderna Sars-Covid-2 Vaccination 11/11/2019, 12/09/2019, 10/06/2020   Tdap 06/02/2015    TDAP status: Up to date  Flu Vaccine status: Up to date  Pneumococcal vaccine status: Declined,  Education has been provided regarding the importance of this vaccine but patient still declined. Advised may receive this vaccine at local pharmacy or Health Dept. Aware to provide a copy of the vaccination record if obtained from local pharmacy or Health Dept. Verbalized acceptance and understanding.   Covid-19 vaccine status: Declined, Education has been provided regarding the importance of this vaccine but patient still declined. Advised may receive this vaccine at local pharmacy or Health Dept.or vaccine clinic. Aware to provide a copy of the vaccination record if obtained from local pharmacy or Health Dept. Verbalized acceptance and understanding.  Qualifies for Shingles Vaccine? Yes   Zostavax completed No   Shingrix Completed?: No.    Education has been provided regarding the importance of this vaccine. Patient  has been advised to call insurance company to determine out of pocket expense if they have not yet received this vaccine. Advised may also receive vaccine at local pharmacy or Health Dept. Verbalized acceptance and understanding.  Screening Tests Health Maintenance  Topic Date Due   Pneumonia Vaccine 79+ Years old (1 - PCV) Never  done   Hepatitis C Screening  Never done   Zoster Vaccines- Shingrix (1 of 2) Never done   COVID-19 Vaccine (4 - Moderna series) 12/01/2020   INFLUENZA VACCINE  05/09/2022   TETANUS/TDAP  06/01/2025   DEXA SCAN  Completed   HPV VACCINES  Aged Out    Health Maintenance  Health Maintenance Due  Topic Date Due   Pneumonia Vaccine 39+ Years old (1 - PCV) Never done   Hepatitis C Screening  Never done   Zoster Vaccines- Shingrix (1 of 2) Never done   COVID-19 Vaccine (4 - Moderna series) 12/01/2020    Declines colonoscopy  Declines Mammogram  Declines bone density  Lung Cancer Screening: (Low Dose CT Chest recommended if Age 30-80 years, 30 pack-year currently smoking OR have quit w/in 15years.) does qualify.   Lung Cancer Screening Referral: declines  Additional Screening:  Hepatitis C Screening: does qualify; Complete with next labs   Vision Screening: Recommended annual ophthalmology exams for early detection of glaucoma and other disorders of the eye. Is the patient up to date with their annual eye exam?  Yes  Who is the provider or what is the name of the office in which the patient attends annual eye exams? [facility]  If pt is not established with a provider, would they like to be referred to a provider to establish care? No .   Dental Screening: Recommended annual dental exams for proper oral hygiene  Community Resource Referral / Chronic Care Management: CRR required this visit?  No   CCM required this visit?  No      Plan:     I have personally reviewed and noted the following in the patient's chart:   Medical and social history Use of alcohol, tobacco or illicit drugs  Current medications and supplements including opioid prescriptions. Patient is not currently taking opioid prescriptions. Functional ability and status Nutritional status Physical activity Advanced directives List of other physicians Hospitalizations, surgeries, and ER visits in previous  12 months Vitals Screenings to include cognitive, depression, and falls Referrals and appointments  In addition, I have reviewed and discussed with patient certain preventive protocols, quality metrics, and best practice recommendations. A written personalized care plan for preventive services as well as general preventive health recommendations were provided to patient.     Melody Comas, [place]   04/28/2022   Nurse Notes:  Ms. Jacque , Thank you for taking time to come for your Medicare Wellness Visit. I appreciate your ongoing commitment to your health goals. Please review the following plan we discussed and let me know if I can assist you in the future.   These are the goals we discussed:  Goals   None     This is a list of the screening recommended for you and due dates:  Health Maintenance  Topic Date Due   Pneumonia Vaccine (1 - PCV) Never done   Hepatitis C Screening: USPSTF Recommendation to screen - Ages 73-79 yo.  Never done   Zoster (Shingles) Vaccine (1 of 2) Never done   COVID-19 Vaccine (4 - Moderna series) 12/01/2020   Flu Shot  05/09/2022   Tetanus Vaccine  06/01/2025   DEXA scan (bone density measurement)  Completed   HPV Vaccine  Aged Out     I have reviewed and agreed to the above documentation.   Theresia Lo, FNP

## 2022-05-19 ENCOUNTER — Encounter: Payer: Self-pay | Admitting: Nurse Practitioner

## 2022-05-19 ENCOUNTER — Ambulatory Visit (INDEPENDENT_AMBULATORY_CARE_PROVIDER_SITE_OTHER): Payer: Medicare Other | Admitting: Nurse Practitioner

## 2022-05-19 VITALS — BP 116/74 | HR 96 | Ht 62.0 in | Wt 167.7 lb

## 2022-05-19 DIAGNOSIS — F5101 Primary insomnia: Secondary | ICD-10-CM | POA: Diagnosis not present

## 2022-05-19 DIAGNOSIS — R062 Wheezing: Secondary | ICD-10-CM | POA: Insufficient documentation

## 2022-05-19 DIAGNOSIS — E782 Mixed hyperlipidemia: Secondary | ICD-10-CM | POA: Diagnosis not present

## 2022-05-19 DIAGNOSIS — N644 Mastodynia: Secondary | ICD-10-CM

## 2022-05-19 MED ORDER — IBUPROFEN 800 MG PO TABS: 800.0000 mg | ORAL_TABLET | Freq: Three times a day (TID) | ORAL | 0 refills | Status: DC | PRN

## 2022-05-19 MED ORDER — TRAZODONE HCL 50 MG PO TABS: 25.0000 mg | ORAL_TABLET | Freq: Every evening | ORAL | 0 refills | Status: DC | PRN

## 2022-05-19 MED ORDER — CYCLOBENZAPRINE HCL 5 MG PO TABS: 5.0000 mg | ORAL_TABLET | Freq: Every day | ORAL | 0 refills | Status: DC

## 2022-05-19 MED ORDER — ATORVASTATIN CALCIUM 40 MG PO TABS: 40.0000 mg | ORAL_TABLET | Freq: Every day | ORAL | 3 refills | Status: DC

## 2022-05-19 NOTE — Assessment & Plan Note (Signed)
Advised patient to take her inhaler continuously. Encourage patient to quit smoking.

## 2022-05-19 NOTE — Assessment & Plan Note (Signed)
Started her on Flexeril and ibuprofen. Advised patient to use warm compress.

## 2022-05-19 NOTE — Progress Notes (Signed)
Established Patient Office Visit  Subjective:  Patient ID: Jennifer Hayes, female    DOB: 03-30-1941  Age: 81 y.o. MRN: [id number]  CC:  Chief Complaint  Patient presents with   Breast Pain    Patient states she was sitting on the couch and dropped a pen, when she bent down to get it she hit her left breast. She reports pain for the past 2 days.     HPI  Jennifer Hayes presents for left breast pain that started two days ago while she pressed her breast on the couch while trying to pick up the pen she dropped. She was taking Tylenol but it did not help.        HPI   Past Medical History:  Diagnosis Date   Arthritis    CHF (congestive heart failure) (HCC)    COPD (chronic obstructive pulmonary disease) (HCC)    Depression    GERD (gastroesophageal reflux disease)    Stroke [facility])     Past Surgical History:  Procedure Laterality Date   APPENDECTOMY     BACK SURGERY      Family History  Family history unknown: Yes    Social History   Socioeconomic History   Marital status: Widowed    Spouse name: Not on file   Number of children: Not on file   Years of education: Not on file   Highest education level: Not on file  Occupational History   Not on file  Tobacco Use   Smoking status: Every Day    Packs/day: 0.50    Types: Cigarettes   Smokeless tobacco: Never  Vaping Use   Vaping Use: Some days  Substance and Sexual Activity   Alcohol use: Not Currently    Comment: occasionally   Drug use: Never   Sexual activity: Yes  Other Topics Concern   Not on file  Social History Narrative   She lives alone at home and has a cat that she adores.    Social Determinants of Health   Financial Resource Strain: Low Risk  (04/28/2022)   Overall Financial Resource Strain (CARDIA)    Difficulty of Paying Living Expenses: Not hard at all  Food Insecurity: No Food Insecurity (04/28/2022)   Hunger Vital Sign    Worried About Running Out of Food in the  Last Year: Never true    Ran Out of Food in the Last Year: Never true  Transportation Needs: No Transportation Needs (04/28/2022)   PRAPARE - Hydrologist (Medical): No    Lack of Transportation (Non-Medical): No  Physical Activity: Inactive (04/28/2022)   Exercise Vital Sign    Days of Exercise per Week: 0 days    Minutes of Exercise per Session: 0 min  Stress: No Stress Concern Present (04/28/2022)   [place]    Feeling of Stress : Only a little  Social Connections: Moderately Integrated (04/28/2022)   Social Connection and Isolation Panel [NHANES]    Frequency of Communication with Friends and Family: More than three times a week    Frequency of Social Gatherings with Friends and Family: More than three times a week    Attends Religious Services: More than 4 times per year    Active Member of [employer] or Organizations: Yes    Attends Archivist Meetings: 1 to 4 times per year    Marital Status: Widowed  Intimate Partner Violence: Not At Risk (  04/28/2022)   Humiliation, Afraid, Rape, and Kick questionnaire    Fear of Current or Ex-Partner: No    Emotionally Abused: No    Physically Abused: No    Sexually Abused: No     Outpatient Medications Prior to Visit  Medication Sig Dispense Refill   celecoxib (CELEBREX) 200 MG capsule Take 1 capsule (200 mg total) by mouth daily at 2 PM. 30 capsule 0   esomeprazole (NEXIUM) 40 MG capsule Take 40 mg by mouth daily at 12 noon.     Fluticasone-Umeclidin-Vilant (TRELEGY ELLIPTA) 100-62.5-25 MCG/INH AEPB Inhale 1 puff into the lungs daily. 1 each 11   Ipratropium-Albuterol (COMBIVENT) 20-100 MCG/ACT AERS respimat Inhale into the lungs.     ipratropium-albuterol (DUONEB) 0.5-2.5 (3) MG/3ML SOLN Take 3 mLs by nebulization 2 (two) times a day as needed. 360 mL 3   atorvastatin (LIPITOR) 40 MG tablet Take 1 tablet (40 mg total) by mouth daily. 90  tablet 3   traZODone (DESYREL) 50 MG tablet Take 0.5-1 tablets (25-50 mg total) by mouth at bedtime as needed for sleep. 90 tablet 0   citalopram (CELEXA) 40 MG tablet Take 1 tablet (40 mg total) by mouth daily. (Patient not taking: Reported on 05/19/2022) 30 tablet 0   No facility-administered medications prior to visit.    Allergies  Allergen Reactions   Bupropion Swelling    headache   Penicillins Hives    ROS Review of Systems  Constitutional:  Negative for activity change and appetite change.  HENT:  Negative for congestion, ear pain and sinus pressure.   Eyes:  Negative for pain and discharge.  Respiratory:  Negative for apnea, choking and chest tightness.   Cardiovascular:  Negative for chest pain and palpitations.  Gastrointestinal:  Negative for abdominal distention and blood in stool.  Genitourinary:  Negative for difficulty urinating and enuresis.  Musculoskeletal:  Positive for back pain and myalgias.  Skin:        Left breast pain.  Neurological:  Negative for dizziness, facial asymmetry, light-headedness and headaches.  Psychiatric/Behavioral:  Positive for sleep disturbance. Negative for agitation, behavioral problems and confusion.       Objective:    Physical Exam Constitutional:      Appearance: Normal appearance. She is overweight.  HENT:     Head: Normocephalic.     Right Ear: Tympanic membrane normal.     Left Ear: Tympanic membrane normal.     Nose: Nose normal.     Mouth/Throat:     Mouth: Mucous membranes are moist.     Pharynx: Oropharynx is clear.  Eyes:     Extraocular Movements: Extraocular movements intact.     Conjunctiva/sclera: Conjunctivae normal.     Pupils: Pupils are equal, round, and reactive to light.  Cardiovascular:     Rate and Rhythm: Normal rate and regular rhythm.     Pulses: Normal pulses.     Heart sounds: Normal heart sounds.  Pulmonary:     Effort: Pulmonary effort is normal.     Breath sounds: Wheezing present.   Abdominal:     General: Bowel sounds are normal.     Palpations: Abdomen is soft.  Musculoskeletal:        General: No swelling or signs of injury.  Skin:    General: Skin is warm and dry.     Capillary Refill: Capillary refill takes less than 2 seconds.  Neurological:     General: No focal deficit present.     Mental Status:  She is alert and oriented to person, place, and time. Mental status is at baseline.  Psychiatric:        Mood and Affect: Mood normal.        Behavior: Behavior normal.        Thought Content: Thought content normal.        Judgment: Judgment normal.     BP 116/74   Pulse 96   Ht 5' 2" (1.575 m)   Wt 167 lb 11.2 oz (76.1 kg)   BMI 30.67 kg/m  Wt Readings from Last 3 Encounters:  05/19/22 167 lb 11.2 oz (76.1 kg)  03/24/22 154 lb 14.4 oz (70.3 kg)  01/17/22 158 lb 4.8 oz (71.8 kg)     Health Maintenance Due  Topic Date Due   Pneumonia Vaccine 13+ Years old (1 - PCV) Never done   Hepatitis C Screening  Never done   Zoster Vaccines- Shingrix (1 of 2) Never done   COVID-19 Vaccine (4 - Moderna series) 12/01/2020   INFLUENZA VACCINE  05/09/2022    There are no preventive care reminders to display for this patient.  Lab Results  Component Value Date   TSH 1.35 03/24/2022   Lab Results  Component Value Date   WBC 8.4 03/24/2022   HGB 14.6 03/24/2022   HCT 43.1 03/24/2022   MCV 92.9 03/24/2022   PLT 215 03/24/2022   Lab Results  Component Value Date   NA 139 03/24/2022   K 4.0 03/24/2022   CO2 22 03/24/2022   GLUCOSE 83 03/24/2022   BUN 13 03/24/2022   CREATININE 0.57 (L) 03/24/2022   BILITOT 0.7 03/24/2022   ALKPHOS 75 10/04/2021   AST 9 (L) 03/24/2022   ALT 8 03/24/2022   PROT 6.2 03/24/2022   ALBUMIN 3.6 10/04/2021   CALCIUM 9.2 03/24/2022   ANIONGAP 6 10/04/2021   EGFR 92 03/24/2022   Lab Results  Component Value Date   CHOL 165 03/24/2022   Lab Results  Component Value Date   HDL 40 (L) 03/24/2022   Lab Results   Component Value Date   LDLCALC 100 (H) 03/24/2022   Lab Results  Component Value Date   TRIG 151 (H) 03/24/2022   Lab Results  Component Value Date   CHOLHDL 4.1 03/24/2022   Lab Results  Component Value Date   HGBA1C 5.5 06/05/2019      Assessment & Plan:   Problem List Items Addressed This Visit       Other   Insomnia    Stable on medication. Refilled  trazodone.      Relevant Medications   traZODone (DESYREL) 50 MG tablet   HLD (hyperlipidemia)   Relevant Medications   atorvastatin (LIPITOR) 40 MG tablet   Breast pain, left - Primary    Started her on Flexeril and ibuprofen. Advised patient to use warm compress.      Wheezing    Advised patient to take her inhaler continuously. Encourage patient to quit smoking.        Meds ordered this encounter  Medications   cyclobenzaprine (FLEXERIL) 5 MG tablet    Sig: Take 1 tablet (5 mg total) by mouth at bedtime.    Dispense:  30 tablet    Refill:  0   ibuprofen (ADVIL) 800 MG tablet    Sig: Take 1 tablet (800 mg total) by mouth every 8 (eight) hours as needed.    Dispense:  30 tablet    Refill:  0   traZODone (  DESYREL) 50 MG tablet    Sig: Take 0.5-1 tablets (25-50 mg total) by mouth at bedtime as needed for sleep.    Dispense:  90 tablet    Refill:  0   atorvastatin (LIPITOR) 40 MG tablet    Sig: Take 1 tablet (40 mg total) by mouth daily.    Dispense:  90 tablet    Refill:  3     Follow-up: Return if symptoms worsen or fail to improve.    Theresia Lo, NP

## 2022-05-19 NOTE — Assessment & Plan Note (Signed)
Stable on medication. Refilled  trazodone.

## 2022-06-26 ENCOUNTER — Other Ambulatory Visit: Payer: Self-pay | Admitting: *Deleted

## 2022-06-26 DIAGNOSIS — J449 Chronic obstructive pulmonary disease, unspecified: Secondary | ICD-10-CM

## 2022-06-26 MED ORDER — TRELEGY ELLIPTA 100-62.5-25 MCG/ACT IN AEPB: 1.0000 | INHALATION_SPRAY | Freq: Every day | RESPIRATORY_TRACT | 11 refills | Status: DC

## 2022-07-04 ENCOUNTER — Other Ambulatory Visit: Payer: Self-pay | Admitting: *Deleted

## 2022-07-04 MED ORDER — CELECOXIB 200 MG PO CAPS: 200.0000 mg | ORAL_CAPSULE | Freq: Every day | ORAL | 0 refills | Status: DC

## 2022-07-14 ENCOUNTER — Encounter: Payer: Self-pay | Admitting: Nurse Practitioner

## 2022-07-14 ENCOUNTER — Ambulatory Visit (INDEPENDENT_AMBULATORY_CARE_PROVIDER_SITE_OTHER): Payer: Medicare Other | Admitting: Nurse Practitioner

## 2022-07-14 VITALS — BP 130/83 | HR 77 | Wt 179.8 lb

## 2022-07-14 DIAGNOSIS — K911 Postgastric surgery syndromes: Secondary | ICD-10-CM | POA: Insufficient documentation

## 2022-07-14 DIAGNOSIS — Z716 Tobacco abuse counseling: Secondary | ICD-10-CM | POA: Diagnosis not present

## 2022-07-14 DIAGNOSIS — R194 Change in bowel habit: Secondary | ICD-10-CM | POA: Diagnosis not present

## 2022-07-14 DIAGNOSIS — J431 Panlobular emphysema: Secondary | ICD-10-CM

## 2022-07-14 MED ORDER — TRELEGY ELLIPTA 100-62.5-25 MCG/ACT IN AEPB: 1.0000 | INHALATION_SPRAY | Freq: Every day | RESPIRATORY_TRACT | 5 refills | Status: DC

## 2022-07-14 MED ORDER — IPRATROPIUM-ALBUTEROL 20-100 MCG/ACT IN AERS: 1.0000 | INHALATION_SPRAY | Freq: Four times a day (QID) | RESPIRATORY_TRACT | 5 refills | Status: DC | PRN

## 2022-07-14 NOTE — Assessment & Plan Note (Signed)
Would refer patient to GI. Encouraged patient to limit fluid intake with meals. Avoid milk and milk products, sugary drinks, alcohol. Advised her to increase protein and fibers intake.

## 2022-07-14 NOTE — Assessment & Plan Note (Addendum)
Encouraged patient to limit fluid intake with meals. Avoid milk and milk products, sugary drinks, alcohol. Advised her to increase protein and fibers intake. Keep a food dairy. Would refer patient to GI if symptoms does not improve.

## 2022-07-14 NOTE — Progress Notes (Signed)
Established Patient Office Visit  Subjective:  Patient ID: Jennifer Hayes, female    DOB: 1941/06/28  Age: 81 y.o. MRN: [id number]  CC:  Chief Complaint  Patient presents with   frequent bowel movements    Patient states that for the past 3 weeks or so she has a bowel movement right after eating. Patient states that sometimes it happens so fast that she can hardly make it to the toilet. Denies nausea or abdominal pain.       HPI  [facility] presents for frequent bowel movement after meals.  Denies nausea, vomiting, diarrhea or abdominal pain.  Denies use of any new medication.  HPI   Past Medical History:  Diagnosis Date   Arthritis    CHF (congestive heart failure) (HCC)    COPD (chronic obstructive pulmonary disease) (HCC)    Depression    GERD (gastroesophageal reflux disease)    Stroke [facility])     Past Surgical History:  Procedure Laterality Date   APPENDECTOMY     BACK SURGERY      Family History  Family history unknown: Yes    Social History   Socioeconomic History   Marital status: Widowed    Spouse name: Not on file   Number of children: Not on file   Years of education: Not on file   Highest education level: Not on file  Occupational History   Not on file  Tobacco Use   Smoking status: Every Day    Packs/day: 0.50    Types: Cigarettes   Smokeless tobacco: Never  Vaping Use   Vaping Use: Some days  Substance and Sexual Activity   Alcohol use: Not Currently    Comment: occasionally   Drug use: Never   Sexual activity: Yes  Other Topics Concern   Not on file  Social History Narrative   She lives alone at home and has a cat that she adores.    Social Determinants of Health   Financial Resource Strain: Low Risk  (04/28/2022)   Overall Financial Resource Strain (CARDIA)    Difficulty of Paying Living Expenses: Not hard at all  Food Insecurity: No Food Insecurity (04/28/2022)   Hunger Vital Sign    Worried About Running  Out of Food in the Last Year: Never true    Ran Out of Food in the Last Year: Never true  Transportation Needs: No Transportation Needs (04/28/2022)   PRAPARE - Hydrologist (Medical): No    Lack of Transportation (Non-Medical): No  Physical Activity: Inactive (04/28/2022)   Exercise Vital Sign    Days of Exercise per Week: 0 days    Minutes of Exercise per Session: 0 min  Stress: No Stress Concern Present (04/28/2022)   [place]    Feeling of Stress : Only a little  Social Connections: Moderately Integrated (04/28/2022)   Social Connection and Isolation Panel [NHANES]    Frequency of Communication with Friends and Family: More than three times a week    Frequency of Social Gatherings with Friends and Family: More than three times a week    Attends Religious Services: More than 4 times per year    Active Member of [employer] or Organizations: Yes    Attends Archivist Meetings: 1 to 4 times per year    Marital Status: Widowed  Intimate Partner Violence: Not At Risk (04/28/2022)   Humiliation, Afraid, Rape, and Kick questionnaire  Fear of Current or Ex-Partner: No    Emotionally Abused: No    Physically Abused: No    Sexually Abused: No     Outpatient Medications Prior to Visit  Medication Sig Dispense Refill   atorvastatin (LIPITOR) 40 MG tablet Take 1 tablet (40 mg total) by mouth daily. 90 tablet 3   celecoxib (CELEBREX) 200 MG capsule Take 1 capsule (200 mg total) by mouth daily at 2 PM. 30 capsule 0   cyclobenzaprine (FLEXERIL) 5 MG tablet Take 1 tablet (5 mg total) by mouth at bedtime. 30 tablet 0   esomeprazole (NEXIUM) 40 MG capsule Take 40 mg by mouth daily at 12 noon.     ibuprofen (ADVIL) 800 MG tablet Take 1 tablet (800 mg total) by mouth every 8 (eight) hours as needed. 30 tablet 0   traZODone (DESYREL) 50 MG tablet Take 0.5-1 tablets (25-50 mg total) by mouth at bedtime  as needed for sleep. 90 tablet 0   Fluticasone-Umeclidin-Vilant (TRELEGY ELLIPTA) 100-62.5-25 MCG/ACT AEPB Inhale 1 puff into the lungs daily. 1 each 11   Fluticasone-Umeclidin-Vilant (TRELEGY ELLIPTA) 100-62.5-25 MCG/INH AEPB Inhale 1 puff into the lungs daily. 1 each 11   Ipratropium-Albuterol (COMBIVENT) 20-100 MCG/ACT AERS respimat Inhale into the lungs.     ipratropium-albuterol (DUONEB) 0.5-2.5 (3) MG/3ML SOLN Take 3 mLs by nebulization 2 (two) times a day as needed. 360 mL 3   No facility-administered medications prior to visit.    Allergies  Allergen Reactions   Bupropion Swelling    headache   Penicillins Hives    ROS Review of Systems  Constitutional: Negative.   HENT: Negative.    Eyes: Negative.   Respiratory: Negative.    Cardiovascular: Negative.   Gastrointestinal: Negative.   Endocrine: Negative.   Musculoskeletal: Negative.   Neurological: Negative.   Psychiatric/Behavioral:  Negative for agitation, behavioral problems and confusion.       Objective:    Physical Exam Constitutional:      Appearance: Normal appearance. She is obese.  HENT:     Head: Normocephalic.     Right Ear: Tympanic membrane normal.     Left Ear: Tympanic membrane normal.     Nose: Nose normal.     Mouth/Throat:     Mouth: Mucous membranes are moist.     Pharynx: Oropharynx is clear.  Eyes:     Extraocular Movements: Extraocular movements intact.     Conjunctiva/sclera: Conjunctivae normal.     Pupils: Pupils are equal, round, and reactive to light.  Cardiovascular:     Rate and Rhythm: Normal rate and regular rhythm.     Pulses: Normal pulses.     Heart sounds: Normal heart sounds.  Pulmonary:     Effort: Pulmonary effort is normal. No respiratory distress.     Breath sounds: Normal breath sounds. No rhonchi.  Abdominal:     General: Bowel sounds are normal.     Palpations: Abdomen is soft. There is no mass.     Tenderness: There is no abdominal tenderness.     Hernia:  No hernia is present.  Musculoskeletal:        General: Normal range of motion.     Cervical back: Neck supple. No tenderness.  Skin:    General: Skin is warm.     Capillary Refill: Capillary refill takes less than 2 seconds.  Neurological:     General: No focal deficit present.     Mental Status: She is alert and oriented to person, place,  and time. Mental status is at baseline.  Psychiatric:        Mood and Affect: Mood normal.        Behavior: Behavior normal.        Thought Content: Thought content normal.        Judgment: Judgment normal.     BP 130/83   Pulse 77   Wt 179 lb 12.8 oz (81.6 kg)   BMI 32.89 kg/m  Wt Readings from Last 3 Encounters:  07/14/22 179 lb 12.8 oz (81.6 kg)  05/19/22 167 lb 11.2 oz (76.1 kg)  03/24/22 154 lb 14.4 oz (70.3 kg)     Health Maintenance  Topic Date Due   Medicare Annual Wellness (AWV)  Never done   Pneumonia Vaccine 42+ Years old (1 - PCV) 07/15/2023 (Originally 11/02/1946)   Hepatitis C Screening  07/15/2023 (Originally 11[birth year]02/1960)   INFLUENZA VACCINE  07/30/2023 (Originally 05/09/2022)   COVID-19 Vaccine (4 - Moderna series) 07/31/2023 (Originally 12/01/2020)   Zoster Vaccines- Shingrix (1 of 2) 08/02/2023 (Originally 11/02/1992)   TETANUS/TDAP  06/01/2025   DEXA SCAN  Completed   HPV VACCINES  Aged Out    There are no preventive care reminders to display for this patient.  Lab Results  Component Value Date   TSH 1.35 03/24/2022   Lab Results  Component Value Date   WBC 8.4 03/24/2022   HGB 14.6 03/24/2022   HCT 43.1 03/24/2022   MCV 92.9 03/24/2022   PLT 215 03/24/2022   Lab Results  Component Value Date   NA 139 03/24/2022   K 4.0 03/24/2022   CO2 22 03/24/2022   GLUCOSE 83 03/24/2022   BUN 13 03/24/2022   CREATININE 0.57 (L) 03/24/2022   BILITOT 0.7 03/24/2022   ALKPHOS 75 10/04/2021   AST 9 (L) 03/24/2022   ALT 8 03/24/2022   PROT 6.2 03/24/2022   ALBUMIN 3.6 10/04/2021   CALCIUM 9.2 03/24/2022   ANIONGAP  6 10/04/2021   EGFR 92 03/24/2022   Lab Results  Component Value Date   CHOL 165 03/24/2022   Lab Results  Component Value Date   HDL 40 (L) 03/24/2022   Lab Results  Component Value Date   LDLCALC 100 (H) 03/24/2022   Lab Results  Component Value Date   TRIG 151 (H) 03/24/2022   Lab Results  Component Value Date   CHOLHDL 4.1 03/24/2022   Lab Results  Component Value Date   HGBA1C 5.5 06/05/2019      Assessment & Plan:   Problem List Items Addressed This Visit       Respiratory   Chronic obstructive pulmonary disease ([place])    Patient smoke half pack of cigarette daily. She is on trelegy. Referred to  Pulmonology.      Relevant Orders   Ambulatory referral to Pulmonology     Other   Frequent bowel movements - Primary    Encouraged patient to limit fluid intake with meals. Avoid milk and milk products, sugary drinks, alcohol. Advised her to increase protein and fibers intake. Keep a food dairy. Would refer patient to GI if symptoms does not improve.       Tobacco abuse counseling    Smoking cessation was discussed, 5-7 minutes was spent of this topic specifically.          Meds ordered this encounter  Medications   DISCONTD: Fluticasone-Umeclidin-Vilant (TRELEGY ELLIPTA) 100-62.5-25 MCG/ACT AEPB    Sig: Inhale 1 puff into the lungs daily.    Dispense:  1 each    Refill:  5   DISCONTD: Ipratropium-Albuterol (COMBIVENT) 20-100 MCG/ACT AERS respimat    Sig: Inhale 1 puff into the lungs every 6 (six) hours as needed for wheezing.    Dispense:  1 each    Refill:  5     Follow-up: No follow-ups on file.    Theresia Lo, NP

## 2022-07-14 NOTE — Assessment & Plan Note (Addendum)
Patient smoke half pack of cigarette daily. She is on trelegy. Referred to  Pulmonology.

## 2022-07-26 ENCOUNTER — Other Ambulatory Visit: Payer: Self-pay | Admitting: *Deleted

## 2022-07-26 MED ORDER — TRELEGY ELLIPTA 100-62.5-25 MCG/ACT IN AEPB: 1.0000 | INHALATION_SPRAY | Freq: Every day | RESPIRATORY_TRACT | 3 refills | Status: DC

## 2022-07-26 MED ORDER — IPRATROPIUM-ALBUTEROL 20-100 MCG/ACT IN AERS: 1.0000 | INHALATION_SPRAY | Freq: Four times a day (QID) | RESPIRATORY_TRACT | 3 refills | Status: DC | PRN

## 2022-07-27 ENCOUNTER — Institutional Professional Consult (permissible substitution): Admitting: Internal Medicine

## 2022-08-03 DIAGNOSIS — Z716 Tobacco abuse counseling: Secondary | ICD-10-CM | POA: Insufficient documentation

## 2022-08-03 NOTE — Assessment & Plan Note (Signed)
Smoking cessation was discussed, 5-7 minutes was spent of this topic specifically.   

## 2022-08-05 ENCOUNTER — Emergency Department: Payer: Medicare Other

## 2022-08-05 ENCOUNTER — Other Ambulatory Visit: Payer: Self-pay

## 2022-08-05 ENCOUNTER — Inpatient Hospital Stay
Admission: EM | Admit: 2022-08-05 | Discharge: 2022-08-07 | DRG: 871 | Disposition: A | Discharge status: Home Health | Payer: Medicare Other | Attending: Internal Medicine | Admitting: Internal Medicine

## 2022-08-05 ENCOUNTER — Encounter: Payer: Self-pay | Admitting: Internal Medicine

## 2022-08-05 DIAGNOSIS — J9621 Acute and chronic respiratory failure with hypoxia: Secondary | ICD-10-CM | POA: Diagnosis present

## 2022-08-05 DIAGNOSIS — E669 Obesity, unspecified: Secondary | ICD-10-CM | POA: Diagnosis not present

## 2022-08-05 DIAGNOSIS — Z720 Tobacco use: Secondary | ICD-10-CM | POA: Diagnosis not present

## 2022-08-05 DIAGNOSIS — J9601 Acute respiratory failure with hypoxia: Secondary | ICD-10-CM

## 2022-08-05 DIAGNOSIS — Z888 Allergy status to other drugs, medicaments and biological substances status: Secondary | ICD-10-CM

## 2022-08-05 DIAGNOSIS — E663 Overweight: Secondary | ICD-10-CM | POA: Diagnosis present

## 2022-08-05 DIAGNOSIS — Z6829 Body mass index (BMI) 29.0-29.9, adult: Secondary | ICD-10-CM | POA: Diagnosis not present

## 2022-08-05 DIAGNOSIS — J181 Lobar pneumonia, unspecified organism: Secondary | ICD-10-CM | POA: Diagnosis present

## 2022-08-05 DIAGNOSIS — R197 Diarrhea, unspecified: Secondary | ICD-10-CM | POA: Diagnosis present

## 2022-08-05 DIAGNOSIS — Z8673 Personal history of transient ischemic attack (TIA), and cerebral infarction without residual deficits: Secondary | ICD-10-CM | POA: Diagnosis not present

## 2022-08-05 DIAGNOSIS — Z791 Long term (current) use of non-steroidal anti-inflammatories (NSAID): Secondary | ICD-10-CM

## 2022-08-05 DIAGNOSIS — K219 Gastro-esophageal reflux disease without esophagitis: Secondary | ICD-10-CM | POA: Diagnosis present

## 2022-08-05 DIAGNOSIS — F17210 Nicotine dependence, cigarettes, uncomplicated: Secondary | ICD-10-CM | POA: Diagnosis present

## 2022-08-05 DIAGNOSIS — Z880 Allergy status to penicillin: Secondary | ICD-10-CM | POA: Diagnosis not present

## 2022-08-05 DIAGNOSIS — Z79899 Other long term (current) drug therapy: Secondary | ICD-10-CM

## 2022-08-05 DIAGNOSIS — R6520 Severe sepsis without septic shock: Secondary | ICD-10-CM

## 2022-08-05 DIAGNOSIS — Z818 Family history of other mental and behavioral disorders: Secondary | ICD-10-CM | POA: Diagnosis not present

## 2022-08-05 DIAGNOSIS — J440 Chronic obstructive pulmonary disease with acute lower respiratory infection: Secondary | ICD-10-CM | POA: Diagnosis present

## 2022-08-05 DIAGNOSIS — A419 Sepsis, unspecified organism: Principal | ICD-10-CM | POA: Diagnosis present

## 2022-08-05 DIAGNOSIS — Z7951 Long term (current) use of inhaled steroids: Secondary | ICD-10-CM | POA: Diagnosis not present

## 2022-08-05 DIAGNOSIS — J441 Chronic obstructive pulmonary disease with (acute) exacerbation: Secondary | ICD-10-CM

## 2022-08-05 DIAGNOSIS — F32A Depression, unspecified: Secondary | ICD-10-CM | POA: Diagnosis present

## 2022-08-05 DIAGNOSIS — R531 Weakness: Secondary | ICD-10-CM

## 2022-08-05 DIAGNOSIS — I5032 Chronic diastolic (congestive) heart failure: Secondary | ICD-10-CM | POA: Diagnosis present

## 2022-08-05 DIAGNOSIS — E785 Hyperlipidemia, unspecified: Secondary | ICD-10-CM

## 2022-08-05 DIAGNOSIS — Z20822 Contact with and (suspected) exposure to covid-19: Secondary | ICD-10-CM | POA: Diagnosis present

## 2022-08-05 DIAGNOSIS — W19XXXA Unspecified fall, initial encounter: Secondary | ICD-10-CM | POA: Diagnosis present

## 2022-08-05 DIAGNOSIS — M1990 Unspecified osteoarthritis, unspecified site: Secondary | ICD-10-CM | POA: Diagnosis present

## 2022-08-05 DIAGNOSIS — J189 Pneumonia, unspecified organism: Secondary | ICD-10-CM

## 2022-08-05 DIAGNOSIS — Z833 Family history of diabetes mellitus: Secondary | ICD-10-CM | POA: Diagnosis not present

## 2022-08-05 DIAGNOSIS — K2100 Gastro-esophageal reflux disease with esophagitis, without bleeding: Secondary | ICD-10-CM | POA: Diagnosis not present

## 2022-08-05 LAB — CBC WITH DIFFERENTIAL/PLATELET
Abs Immature Granulocytes: 0.24 10*3/uL — ABNORMAL HIGH (ref 0.00–0.07)
Basophils Absolute: 0 10*3/uL (ref 0.0–0.1)
Basophils Relative: 0 %
Eosinophils Absolute: 0.1 10*3/uL (ref 0.0–0.5)
Eosinophils Relative: 1 %
HCT: 39.5 % (ref 36.0–46.0)
Hemoglobin: 13.2 g/dL (ref 12.0–15.0)
Immature Granulocytes: 1 %
Lymphocytes Relative: 8 %
Lymphs Abs: 1.4 10*3/uL (ref 0.7–4.0)
MCH: 29.7 pg (ref 26.0–34.0)
MCHC: 33.4 g/dL (ref 30.0–36.0)
MCV: 88.8 fL (ref 80.0–100.0)
Monocytes Absolute: 1.2 10*3/uL — ABNORMAL HIGH (ref 0.1–1.0)
Monocytes Relative: 7 %
Neutro Abs: 15.2 10*3/uL — ABNORMAL HIGH (ref 1.7–7.7)
Neutrophils Relative %: 83 %
Platelets: 175 10*3/uL (ref 150–400)
RBC: 4.45 MIL/uL (ref 3.87–5.11)
RDW: 12 % (ref 11.5–15.5)
WBC: 18.1 10*3/uL — ABNORMAL HIGH (ref 4.0–10.5)
nRBC: 0 % (ref 0.0–0.2)

## 2022-08-05 LAB — LACTIC ACID, PLASMA: Lactic Acid, Venous: 1.5 mmol/L (ref 0.5–1.9)

## 2022-08-05 LAB — COMPREHENSIVE METABOLIC PANEL
ALT: 11 U/L (ref 0–44)
AST: 15 U/L (ref 15–41)
Albumin: 3.4 g/dL — ABNORMAL LOW (ref 3.5–5.0)
Alkaline Phosphatase: 93 U/L (ref 38–126)
Anion gap: 7 (ref 5–15)
BUN: 23 mg/dL (ref 8–23)
CO2: 26 mmol/L (ref 22–32)
Calcium: 8.6 mg/dL — ABNORMAL LOW (ref 8.9–10.3)
Chloride: 104 mmol/L (ref 98–111)
Creatinine, Ser: 0.68 mg/dL (ref 0.44–1.00)
GFR, Estimated: >60 mL/min (ref >60)
Glucose, Bld: 126 mg/dL — ABNORMAL HIGH (ref 70–99)
Potassium: 3.8 mmol/L (ref 3.5–5.1)
Sodium: 137 mmol/L (ref 135–145)
Total Bilirubin: 1.4 mg/dL — ABNORMAL HIGH (ref 0.3–1.2)
Total Protein: 7.1 g/dL (ref 6.5–8.1)

## 2022-08-05 LAB — GASTROINTESTINAL PANEL BY PCR, STOOL (REPLACES STOOL CULTURE)

## 2022-08-05 LAB — PROTIME-INR
INR: 1.1 (ref 0.8–1.2)
Prothrombin Time: 13.9 seconds (ref 11.4–15.2)

## 2022-08-05 LAB — C DIFFICILE QUICK SCREEN W PCR REFLEX
C Diff antigen: NEGATIVE
C Diff interpretation: NOT DETECTED
C Diff toxin: NEGATIVE

## 2022-08-05 LAB — APTT: aPTT: 33 seconds (ref 24–36)

## 2022-08-05 LAB — RESP PANEL BY RT-PCR (FLU A&B, COVID) ARPGX2
Influenza A by PCR: NEGATIVE
Influenza B by PCR: NEGATIVE
SARS Coronavirus 2 by RT PCR: NEGATIVE

## 2022-08-05 MED ORDER — NICOTINE 21 MG/24HR TD PT24
21.0000 mg | MEDICATED_PATCH | Freq: Every day | TRANSDERMAL | Status: DC
  Administered 2022-08-05 – 2022-08-07 (×3): 21 mg via TRANSDERMAL
  Filled 2022-08-05 (×3): qty 1

## 2022-08-05 MED ORDER — METHYLPREDNISOLONE SODIUM SUCC 40 MG IJ SOLR
40.0000 mg | Freq: Every day | INTRAMUSCULAR | Status: DC
  Administered 2022-08-06 – 2022-08-07 (×2): 40 mg via INTRAVENOUS
  Filled 2022-08-05 (×2): qty 1

## 2022-08-05 MED ORDER — LACTATED RINGERS IV SOLN: INTRAVENOUS | Status: DC

## 2022-08-05 MED ORDER — ACETAMINOPHEN 650 MG RE SUPP: 650.0000 mg | Freq: Four times a day (QID) | RECTAL | Status: DC | PRN

## 2022-08-05 MED ORDER — ONDANSETRON HCL 4 MG/2ML IJ SOLN: 4.0000 mg | Freq: Four times a day (QID) | INTRAMUSCULAR | Status: DC | PRN

## 2022-08-05 MED ORDER — LACTATED RINGERS IV BOLUS (SEPSIS)
1000.0000 mL | Freq: Once | INTRAVENOUS | Status: AC
  Administered 2022-08-05: 1000 mL via INTRAVENOUS

## 2022-08-05 MED ORDER — ACETAMINOPHEN 325 MG PO TABS
650.0000 mg | ORAL_TABLET | Freq: Four times a day (QID) | ORAL | Status: DC | PRN
  Administered 2022-08-05 – 2022-08-06 (×3): 650 mg via ORAL
  Filled 2022-08-05 (×3): qty 2

## 2022-08-05 MED ORDER — ENOXAPARIN SODIUM 40 MG/0.4ML IJ SOSY
0.5000 mg/kg | PREFILLED_SYRINGE | INTRAMUSCULAR | Status: DC
  Administered 2022-08-05: 37.5 mg via SUBCUTANEOUS
  Filled 2022-08-05: qty 0.4

## 2022-08-05 MED ORDER — LEVOFLOXACIN IN D5W 750 MG/150ML IV SOLN
750.0000 mg | INTRAVENOUS | Status: DC
  Administered 2022-08-05 – 2022-08-07 (×2): 750 mg via INTRAVENOUS
  Filled 2022-08-05 (×2): qty 150

## 2022-08-05 MED ORDER — METHYLPREDNISOLONE SODIUM SUCC 40 MG IJ SOLR
40.0000 mg | INTRAMUSCULAR | Status: AC
  Administered 2022-08-05: 40 mg via INTRAVENOUS
  Filled 2022-08-05: qty 1

## 2022-08-05 MED ORDER — VANCOMYCIN HCL IN DEXTROSE 1-5 GM/200ML-% IV SOLN
1000.0000 mg | Freq: Once | INTRAVENOUS | Status: AC
  Administered 2022-08-05: 1000 mg via INTRAVENOUS
  Filled 2022-08-05: qty 200

## 2022-08-05 MED ORDER — BUDESONIDE 0.25 MG/2ML IN SUSP
0.2500 mg | Freq: Two times a day (BID) | RESPIRATORY_TRACT | Status: DC
  Administered 2022-08-05 – 2022-08-07 (×4): 0.25 mg via RESPIRATORY_TRACT
  Filled 2022-08-05 (×4): qty 2

## 2022-08-05 MED ORDER — ACETAMINOPHEN 325 MG PO TABS
ORAL_TABLET | ORAL | Status: AC
  Administered 2022-08-05: 650 mg via ORAL
  Filled 2022-08-05: qty 2

## 2022-08-05 MED ORDER — SODIUM CHLORIDE 0.9 % IV SOLN
2.0000 g | Freq: Once | INTRAVENOUS | Status: AC
  Administered 2022-08-05: 2 g via INTRAVENOUS
  Filled 2022-08-05: qty 10

## 2022-08-05 MED ORDER — ACETAMINOPHEN 325 MG PO TABS: 650.0000 mg | ORAL_TABLET | Freq: Once | ORAL | Status: AC | PRN

## 2022-08-05 MED ORDER — IPRATROPIUM-ALBUTEROL 0.5-2.5 (3) MG/3ML IN SOLN
3.0000 mL | Freq: Four times a day (QID) | RESPIRATORY_TRACT | Status: DC
  Administered 2022-08-05 – 2022-08-07 (×7): 3 mL via RESPIRATORY_TRACT
  Filled 2022-08-05 (×7): qty 3

## 2022-08-05 MED ORDER — CELECOXIB 200 MG PO CAPS
200.0000 mg | ORAL_CAPSULE | Freq: Every day | ORAL | Status: DC
  Administered 2022-08-06: 200 mg via ORAL
  Filled 2022-08-05 (×2): qty 1

## 2022-08-05 MED ORDER — PANTOPRAZOLE SODIUM 40 MG PO TBEC
40.0000 mg | DELAYED_RELEASE_TABLET | Freq: Every day | ORAL | Status: DC
  Administered 2022-08-06 – 2022-08-07 (×2): 40 mg via ORAL
  Filled 2022-08-05 (×2): qty 1

## 2022-08-05 MED ORDER — ATORVASTATIN CALCIUM 20 MG PO TABS
40.0000 mg | ORAL_TABLET | Freq: Every day | ORAL | Status: DC
  Administered 2022-08-05 – 2022-08-07 (×3): 40 mg via ORAL
  Filled 2022-08-05 (×3): qty 2

## 2022-08-05 MED ORDER — TRAZODONE HCL 50 MG PO TABS
25.0000 mg | ORAL_TABLET | Freq: Every evening | ORAL | Status: DC | PRN
  Administered 2022-08-06: 25 mg via ORAL
  Filled 2022-08-05 (×2): qty 1

## 2022-08-05 MED ORDER — ACETAMINOPHEN 325 MG PO TABS
650.0000 mg | ORAL_TABLET | Freq: Once | ORAL | Status: DC
  Filled 2022-08-05: qty 2

## 2022-08-05 MED ORDER — CYCLOBENZAPRINE HCL 10 MG PO TABS
5.0000 mg | ORAL_TABLET | Freq: Every evening | ORAL | Status: DC | PRN
  Administered 2022-08-06: 5 mg via ORAL
  Filled 2022-08-05 (×2): qty 1

## 2022-08-05 MED ORDER — ONDANSETRON HCL 4 MG PO TABS: 4.0000 mg | ORAL_TABLET | Freq: Four times a day (QID) | ORAL | Status: DC | PRN

## 2022-08-05 MED ORDER — METRONIDAZOLE 500 MG/100ML IV SOLN
500.0000 mg | Freq: Once | INTRAVENOUS | Status: AC
  Administered 2022-08-05: 500 mg via INTRAVENOUS
  Filled 2022-08-05: qty 100

## 2022-08-05 MED ORDER — IOHEXOL 350 MG/ML SOLN: 75.0000 mL | Freq: Once | INTRAVENOUS | Status: DC | PRN

## 2022-08-05 MED ORDER — ARFORMOTEROL TARTRATE 15 MCG/2ML IN NEBU
15.0000 ug | INHALATION_SOLUTION | Freq: Two times a day (BID) | RESPIRATORY_TRACT | Status: DC
  Administered 2022-08-06 – 2022-08-07 (×3): 15 ug via RESPIRATORY_TRACT
  Filled 2022-08-05 (×6): qty 2

## 2022-08-05 MED ORDER — LACTATED RINGERS IV BOLUS
1000.0000 mL | Freq: Once | INTRAVENOUS | Status: AC
  Administered 2022-08-05: 1000 mL via INTRAVENOUS

## 2022-08-05 NOTE — Assessment & Plan Note (Addendum)
Clinical sepsis, present on admission with right middle lobe pneumonia, fever, tachycardia and leukocytosis and acute respiratory failure.  ER physician gave aztreonam, vancomycin and Flagyl.  I will change antibiotics to Levaquin for this evening since the patient does have a penicillin allergy.  Follow-up cultures.

## 2022-08-05 NOTE — Assessment & Plan Note (Signed)
Nicotine patch prescribed 

## 2022-08-05 NOTE — ED Triage Notes (Signed)
Pt in via EMS from home with c/o general weakness. Pt with hx of CHF and is also a heavy smoker. Pt reports feels weak and has had a HA for the last few days. Pt smells strongly of urine. 98.7, 108/60, RR 20, 89% RA, placed on 4L now at 90% on 4L. CBG 187

## 2022-08-05 NOTE — Progress Notes (Signed)
Patient arrived to unit via bed with 6L supplemental oxygen,  obtained  vital signs oriented to room and staff, will report off to oncoming nurse to complete admission.

## 2022-08-05 NOTE — ED Triage Notes (Addendum)
Pt to ED via [facility] from home. Pt reports generalized weakness, HA and frequent falls that started yesterday at [facility]. Pt denies blood thinners. Pt states she did hit her head with one of the falls but denies visual changes. Pt febrile in triage with temp 102.7 and HR 110. Pt 87% on 2L [place] and increased to 4-6L . Strong urine smell noted  Pt hx CHF, current smoker and has PRN oxygen at home.

## 2022-08-05 NOTE — Assessment & Plan Note (Signed)
Stool studies negative.

## 2022-08-05 NOTE — Assessment & Plan Note (Signed)
-  Continue Lipitor °

## 2022-08-05 NOTE — ED Notes (Signed)
Pt transported to CT. Will initiate sepsis orders upon return

## 2022-08-05 NOTE — ED Provider Notes (Signed)
[facility] Provider Note    Event Date/Time   First MD Initiated Contact with Patient 08/05/22 1539     (approximate)   History   Weakness   HPI  Xiya Maez is a 81 y.o. female who reports on Thursday this past week she was fine she went up to [place] with her daughter got sick while she was there had very weak and fell several times hitting her head.  She did not feel well so came home.  Came to the hospital today with a fever some pain in her right chest with a cough pain is kind of achy and pleuritic is not sharp and stabbing.  EMS reported patient smelled strongly of urine and this is true.      Physical Exam   Triage Vital Signs: ED Triage Vitals  Enc Vitals Group     BP 08/05/22 1415 (!) 129/53     Pulse Rate 08/05/22 1415 (!) 110     Resp 08/05/22 1415 (!) 22     Temp 08/05/22 1415 (!) 102.7 F (39.3 C)     Temp Source 08/05/22 1415 Oral     SpO2 08/05/22 1415 (!) 87 %     Weight --      Height --      Head Circumference --      Peak Flow --      Pain Score 08/05/22 1413 7     Pain Loc --      Pain Edu? --      Excl. in [place]? --     Most recent vital signs: Vitals:   08/05/22 1847 08/05/22 2131  BP: (!) 119/55   Pulse: 85   Resp: 16   Temp: 99.9 F (37.7 C)   SpO2: 95% 94%     General: Awake, alert oriented breathing hard CV:  Good peripheral perfusion.  Resp:  Increased effort there appear to be some crackles in the right upper lobe area. Abd:  No distention.  Soft and nontender Extremities no edema   ED Results / Procedures / Treatments   Labs (all labs ordered are listed, but only abnormal results are displayed) Labs Reviewed  COMPREHENSIVE METABOLIC PANEL - Abnormal; Notable for the following components:      Result Value   Glucose, Bld 126 (*)    Calcium 8.6 (*)    Albumin 3.4 (*)    Total Bilirubin 1.4 (*)    All other components within normal limits  CBC WITH DIFFERENTIAL/PLATELET -  Abnormal; Notable for the following components:   WBC 18.1 (*)    Neutro Abs 15.2 (*)    Monocytes Absolute 1.2 (*)    Abs Immature Granulocytes 0.24 (*)    All other components within normal limits  RESP PANEL BY RT-PCR (FLU A&B, COVID) ARPGX2  GASTROINTESTINAL PANEL BY PCR, STOOL (REPLACES STOOL CULTURE)  C DIFFICILE QUICK SCREEN W PCR REFLEX    CULTURE, BLOOD (ROUTINE X 2)  CULTURE, BLOOD (ROUTINE X 2)  URINE CULTURE  LACTIC ACID, PLASMA  PROTIME-INR  APTT  URINALYSIS, ROUTINE W REFLEX MICROSCOPIC  URINALYSIS, COMPLETE (UACMP) WITH MICROSCOPIC  CBC  BRAIN NATRIURETIC PEPTIDE  BASIC METABOLIC PANEL     EKG  EKG read and interpreted by me shows sinus tachycardia 109 normal axis some loss of T wave amplitude in the precordial leads as well as loss of overall amplitude in precordial leads decreased R wave progression.  No obvious ST segment elevation or  depression.   RADIOLOGY Chest x-ray read by radiology as right middle lobe infiltrate.  I reviewed and interpreted the film I agree there may be some right upper lobe infiltrate as well.   PROCEDURES:  Critical Care performed: Critical care time half an hour.  This includes reviewing some of the patient's old records the current studies and speaking with her several times.  I also spoke with the hospital doc and initiated treatment.  Procedures   MEDICATIONS ORDERED IN ED: Medications  lactated ringers infusion ( Intravenous New Bag/Given 08/05/22 2121)  acetaminophen (TYLENOL) tablet 650 mg (0 mg Oral Hold 08/05/22 1641)  iohexol (OMNIPAQUE) 350 MG/ML injection 75 mL (has no administration in time range)  ipratropium-albuterol (DUONEB) 0.5-2.5 (3) MG/3ML nebulizer solution 3 mL (3 mLs Nebulization Given 08/05/22 2131)  budesonide (PULMICORT) nebulizer solution 0.25 mg (0.25 mg Nebulization Given 08/05/22 2131)  levofloxacin (LEVAQUIN) IVPB 750 mg (750 mg Intravenous New Bag/Given 08/05/22 2346)  methylPREDNISolone sodium  succinate (SOLU-MEDROL) 40 mg/mL injection 40 mg (has no administration in time range)  enoxaparin (LOVENOX) injection 37.5 mg (37.5 mg Subcutaneous Given 08/05/22 2120)  acetaminophen (TYLENOL) tablet 650 mg (650 mg Oral Given 08/05/22 2119)    Or  acetaminophen (TYLENOL) suppository 650 mg ( Rectal See Alternative 08/05/22 2119)  ondansetron (ZOFRAN) tablet 4 mg (has no administration in time range)    Or  ondansetron (ZOFRAN) injection 4 mg (has no administration in time range)  nicotine (NICODERM CQ - dosed in mg/24 hours) patch 21 mg (21 mg Transdermal Patch Applied 08/05/22 1941)  celecoxib (CELEBREX) capsule 200 mg (has no administration in time range)  atorvastatin (LIPITOR) tablet 40 mg (40 mg Oral Given 08/05/22 2119)  traZODone (DESYREL) tablet 25 mg (has no administration in time range)  pantoprazole (PROTONIX) EC tablet 40 mg (has no administration in time range)  cyclobenzaprine (FLEXERIL) tablet 5 mg (has no administration in time range)  arformoterol (BROVANA) nebulizer solution 15 mcg (15 mcg Nebulization Not Given 08/05/22 2132)  acetaminophen (TYLENOL) tablet 650 mg (650 mg Oral Given 08/05/22 1430)  lactated ringers bolus 1,000 mL (0 mLs Intravenous Stopped 08/05/22 1749)  aztreonam (AZACTAM) 2 g in sodium chloride 0.9 % 100 mL IVPB (0 g Intravenous Stopped 08/05/22 1700)  metroNIDAZOLE (FLAGYL) IVPB 500 mg (0 mg Intravenous Stopped 08/05/22 1819)  vancomycin (VANCOCIN) IVPB 1000 mg/200 mL premix (0 mg Intravenous Stopped 08/06/22 0024)  lactated ringers bolus 1,000 mL (1,000 mLs Intravenous New Bag/Given 08/05/22 1947)  methylPREDNISolone sodium succinate (SOLU-MEDROL) 40 mg/mL injection 40 mg (40 mg Intravenous Given 08/05/22 1940)     IMPRESSION / MDM / ASSESSMENT AND PLAN / ED COURSE  I reviewed the triage vital signs and the nursing notes. Patient is alert and oriented but with lower than normal blood pressure fever high white count sepsis.  Differential  diagnosis is sepsis due to pneumonia sepsis due to UTI sepsis from lung COVID-19 positive test (U07.1, COVID-19) with Acute Respiratory Distress Syndrome (ARDS) (J80, ARDS) (If respiratory failure or sepsis present, add as separate assessment)    Patient's presentation is most consistent with acute presentation with potential threat to life or bodily function.  The patient is on the cardiac monitor to evaluate for evidence of arrhythmia and/or significant heart rate changes.  None have been seen   FINAL CLINICAL IMPRESSION(S) / ED DIAGNOSES   Final diagnoses:  Weakness  Community acquired pneumonia of right lung, unspecified part of lung  Patient is also septic but I cannot get the computer  to put sepsis due to unknown organism and.   Rx / DC Orders   ED Discharge Orders     None        Note:  This document was prepared using Dragon voice recognition software and may include unintentional dictation errors.   Nena Polio, MD 08/06/22 [phone]

## 2022-08-05 NOTE — Assessment & Plan Note (Addendum)
Patient was given Solu-Medrol here and nebulizer treatments.  Can go back on her Trelegy inhaler and a few more days of prednisone at home.

## 2022-08-05 NOTE — Assessment & Plan Note (Signed)
No signs of heart failure currently.

## 2022-08-05 NOTE — Assessment & Plan Note (Signed)
Physical therapy evaluation °

## 2022-08-05 NOTE — Consult Note (Signed)
PHARMACY -  BRIEF ANTIBIOTIC NOTE   Pharmacy has received consult(s) for vancomycin and aztreonam from an ED provider.  The patient's profile has been reviewed for ht/wt/allergies/indication/available labs.    One time order(s) placed for aztreonam 2 g and vancomycin 1 g (no weight entered)   Further antibiotics/pharmacy consults should be ordered by admitting physician if indicated.                       Thank you, Darnelle Bos, PharmD 08/05/2022  3:59 PM

## 2022-08-05 NOTE — Assessment & Plan Note (Signed)
Continue PPI ?

## 2022-08-05 NOTE — Consult Note (Signed)
CODE SEPSIS - PHARMACY COMMUNICATION  **Broad Spectrum Antibiotics should be administered within 1 hour of Sepsis diagnosis**  Time Code Sepsis Called/Page Received: 1542  Antibiotics Ordered: 1542  Time of 1st antibiotic administration: 9794  Additional action taken by pharmacy: N/A  If necessary, Name of Provider/Nurse Contacted: N/A    Darnelle Bos ,PharmD Clinical Pharmacist  08/05/2022  3:59 PM

## 2022-08-05 NOTE — Assessment & Plan Note (Signed)
ER physician ordered triple antibiotics.  I will change antibiotics to Levaquin this evening.

## 2022-08-05 NOTE — Assessment & Plan Note (Addendum)
The patient does have chronic respiratory failure but only wears her oxygen as needed.  Pulse ox was 87% on 2 L.  Patient had increasing oxygen requirements overnight but the patient has gel nails and we will likely not get a accurate pulse ox.  Asked nursing staff to check pulse ox on her ear.  Currently on 2 L

## 2022-08-05 NOTE — Progress Notes (Signed)
[facility]link following for sepsis protocol. 

## 2022-08-05 NOTE — H&P (Signed)
History and Physical    Patient: Jennifer Hayes JOA:[id number] DOB: Nov 13, 1940 DOA: 08/05/2022 DOS: the patient was seen and examined on 08/05/2022 PCP: Cletis Athens, MD  Patient coming from: Home  Chief Complaint:  Chief Complaint  Patient presents with   Weakness   HPI: Jennifer Hayes is a 81 y.o. female with medical history significant of arthritis, diastolic congestive heart failure COPD on oxygen as needed, GERD and stroke.  She presents to the hospital with not feeling well.  She had a fall while out.  She was not feeling too good so ended up going back home.  She has been unable to get up.  She does have some rib pain.  She has not been as coherent and has been out of bed.  She was brought into the hospital and found to have clinical sepsis with right middle lobe pneumonia.  Hospitalist services contacted for further evaluation.  Patient does complain of some cough and some wheezing and occasional shortness of breath.  Also having 2-3 episodes of diarrhea per day. Review of Systems: Review of Systems  Constitutional:  Positive for fever and malaise/fatigue. Negative for chills and weight loss.  HENT:  Positive for congestion. Negative for sore throat.   Eyes:  Negative for blurred vision.  Respiratory:  Positive for cough, shortness of breath and wheezing.   Cardiovascular:  Negative for chest pain.  Gastrointestinal:  Positive for diarrhea, heartburn and nausea. Negative for abdominal pain, blood in stool and vomiting.  Genitourinary:  Negative for dysuria.  Musculoskeletal:  Positive for falls and myalgias.  Skin:  Negative for rash.  Neurological:  Negative for dizziness.  Endo/Heme/Allergies:  Does not bruise/bleed easily.  Psychiatric/Behavioral:  Negative for depression.     Past Medical History:  Diagnosis Date   Arthritis    CHF (congestive heart failure) (HCC)    COPD (chronic obstructive pulmonary disease) (HCC)    Depression    GERD  (gastroesophageal reflux disease)    Stroke [facility])    Past Surgical History:  Procedure Laterality Date   APPENDECTOMY     BACK SURGERY     Social History:  reports that she has been smoking cigarettes. She has been smoking an average of .5 packs per day. She has never used smokeless tobacco. She reports that she does not currently use alcohol. She reports that she does not use drugs.  Allergies  Allergen Reactions   Bupropion Swelling    headache   Penicillins Hives    Family History  Problem Relation Age of Onset   Dementia Mother    Diabetes Mother     Prior to Admission medications   Medication Sig Start Date End Date Taking? Authorizing Provider  atorvastatin (LIPITOR) 40 MG tablet Take 1 tablet (40 mg total) by mouth daily. 05/19/22   Theresia Lo, NP  celecoxib (CELEBREX) 200 MG capsule Take 1 capsule (200 mg total) by mouth daily at 2 PM. 07/04/22   Cletis Athens, MD  cyclobenzaprine (FLEXERIL) 5 MG tablet Take 1 tablet (5 mg total) by mouth at bedtime. 05/19/22   Theresia Lo, NP  esomeprazole (NEXIUM) 40 MG capsule Take 40 mg by mouth daily at 12 noon.    [provider]  Fluticasone-Umeclidin-Vilant (TRELEGY ELLIPTA) 100-62.5-25 MCG/ACT AEPB Inhale 1 puff into the lungs daily. 07/26/22   Cletis Athens, MD  Ipratropium-Albuterol (COMBIVENT) 20-100 MCG/ACT AERS respimat Inhale 1 puff into the lungs every 6 (six) hours as needed for wheezing. 07/26/22   Cletis Athens,  MD  traZODone (DESYREL) 50 MG tablet Take 0.5-1 tablets (25-50 mg total) by mouth at bedtime as needed for sleep. 05/19/22   Kara Dies, NP    Physical Exam: Vitals:   08/05/22 1645 08/05/22 1700 08/05/22 1713 08/05/22 1730  BP:  (!) 116/49  (!) 119/58  Pulse: 98 93  92  Resp: (!) 24 (!) 22  (!) 27  Temp:      TempSrc:      SpO2: 94% 92%  91%  Weight:   76.2 kg    Physical Exam HENT:     Head: Normocephalic.     Mouth/Throat:     Pharynx: No oropharyngeal exudate.  Eyes:      General: Lids are normal.     Conjunctiva/sclera: Conjunctivae normal.  Cardiovascular:     Rate and Rhythm: Normal rate and regular rhythm.     Heart sounds: Normal heart sounds, S1 normal and S2 normal.  Pulmonary:     Breath sounds: Examination of the right-middle field reveals decreased breath sounds and wheezing. Examination of the left-middle field reveals decreased breath sounds and wheezing. Examination of the right-lower field reveals decreased breath sounds and rhonchi. Examination of the left-lower field reveals decreased breath sounds and rhonchi. Decreased breath sounds, wheezing and rhonchi present. No rales.  Abdominal:     Palpations: Abdomen is soft.     Tenderness: There is no abdominal tenderness.  Musculoskeletal:     Right lower leg: Swelling present.     Left lower leg: Swelling present.  Skin:    General: Skin is warm.     Findings: No rash.  Neurological:     Mental Status: She is alert and oriented to person, place, and time.     Data Reviewed: Last echocardiogram showed an EF of 63% White blood cell count 18.1, hemoglobin 13.3, platelet count 175 Lactic acid 1.5, total bilirubin 1.4, albumin 3.4, calcium 8.6, creatinine 0.68, sodium 137, potassium 3.8 Checks x-ray right perihilar pneumonia EKG interpreted by me shows sinus tachycardia 109 bpm nonspecific ST-T wave changes  Assessment and Plan: * Sepsis (HCC) Clinical sepsis, present on admission with right middle lobe pneumonia, fever, tachycardia and leukocytosis and acute respiratory failure.  ER physician gave aztreonam, vancomycin and Flagyl.  I will change antibiotics to Levaquin for this evening since the patient does have a penicillin allergy.  Follow-up cultures.  Lobar pneumonia [facility]) ER physician ordered triple antibiotics.  I will change antibiotics to Levaquin this evening.  Acute respiratory failure with hypoxia (HCC) The patient does have chronic respiratory failure but only wears her oxygen  as needed.  Pulse ox was 87% on 2 L.  Currently on 6 L.  I decreased down to 5 L.  COVID test pending.  COPD with acute exacerbation (HCC) Start Solu-Medrol 40 mg IV daily first dose now.  Add nebulizers DuoNeb, budesonide and Brovana.  Diarrhea Send off stool studies  Chronic diastolic CHF (congestive heart failure) (HCC) No signs of heart failure currently.  Watch with sepsis protocol IV fluids.  Weakness Physical therapy evaluation  Hyperlipidemia Continue Lipitor  GERD (gastroesophageal reflux disease) Continue PPI  Tobacco abuse Nicotine patch prescribed  Obesity, Class I, BMI 30-34.9 Need a current height and weight in computer.      Advance Care Planning:   Code Status: Full Code   Consults: None  Family Communication: Spoke with family at the bedside  Severity of Illness: The appropriate patient status for this patient is INPATIENT. Inpatient status is judged  to be reasonable and necessary in order to provide the required intensity of service to ensure the patient's safety. The patient's presenting symptoms, physical exam findings, and initial radiographic and laboratory data in the context of their chronic comorbidities is felt to place them at high risk for further clinical deterioration. Furthermore, it is not anticipated that the patient will be medically stable for discharge from the hospital within 2 midnights of admission.   * I certify that at the point of admission it is my clinical judgment that the patient will require inpatient hospital care spanning beyond 2 midnights from the point of admission due to high intensity of service, high risk for further deterioration and high frequency of surveillance required.*  Author: Alford Highland, MD 08/05/2022 6:07 PM  For on call review [email or web address].

## 2022-08-05 NOTE — ED Notes (Signed)
Pt with soiled brief and pants. Pt cleaned. New brief and absorbance pad in place. Purewick placed on pt.

## 2022-08-05 NOTE — Assessment & Plan Note (Signed)
Need a current height and weight in computer.

## 2022-08-06 ENCOUNTER — Inpatient Hospital Stay: Payer: Medicare Other

## 2022-08-06 DIAGNOSIS — A419 Sepsis, unspecified organism: Secondary | ICD-10-CM | POA: Diagnosis not present

## 2022-08-06 DIAGNOSIS — J9601 Acute respiratory failure with hypoxia: Secondary | ICD-10-CM | POA: Diagnosis not present

## 2022-08-06 DIAGNOSIS — E663 Overweight: Secondary | ICD-10-CM

## 2022-08-06 DIAGNOSIS — R531 Weakness: Secondary | ICD-10-CM | POA: Diagnosis not present

## 2022-08-06 DIAGNOSIS — J181 Lobar pneumonia, unspecified organism: Secondary | ICD-10-CM | POA: Diagnosis not present

## 2022-08-06 LAB — CBC
HCT: 34.6 % — ABNORMAL LOW (ref 36.0–46.0)
Hemoglobin: 11.8 g/dL — ABNORMAL LOW (ref 12.0–15.0)
MCH: 30.2 pg (ref 26.0–34.0)
MCHC: 34.1 g/dL (ref 30.0–36.0)
MCV: 88.5 fL (ref 80.0–100.0)
Platelets: 156 10*3/uL (ref 150–400)
RBC: 3.91 MIL/uL (ref 3.87–5.11)
RDW: 12 % (ref 11.5–15.5)
WBC: 14.3 10*3/uL — ABNORMAL HIGH (ref 4.0–10.5)
nRBC: 0 % (ref 0.0–0.2)

## 2022-08-06 LAB — BASIC METABOLIC PANEL
Anion gap: 10 (ref 5–15)
BUN: 23 mg/dL (ref 8–23)
CO2: 24 mmol/L (ref 22–32)
Calcium: 8.4 mg/dL — ABNORMAL LOW (ref 8.9–10.3)
Chloride: 105 mmol/L (ref 98–111)
Creatinine, Ser: 0.64 mg/dL (ref 0.44–1.00)
GFR, Estimated: >60 mL/min (ref >60)
Glucose, Bld: 184 mg/dL — ABNORMAL HIGH (ref 70–99)
Potassium: 3.6 mmol/L (ref 3.5–5.1)
Sodium: 139 mmol/L (ref 135–145)

## 2022-08-06 LAB — BRAIN NATRIURETIC PEPTIDE: B Natriuretic Peptide: 147.2 pg/mL — ABNORMAL HIGH (ref 0.0–100.0)

## 2022-08-06 MED ORDER — OXYCODONE HCL 5 MG PO TABS
5.0000 mg | ORAL_TABLET | Freq: Four times a day (QID) | ORAL | Status: DC | PRN
  Administered 2022-08-06: 5 mg via ORAL
  Filled 2022-08-06: qty 1

## 2022-08-06 MED ORDER — ENOXAPARIN SODIUM 40 MG/0.4ML IJ SOSY
40.0000 mg | PREFILLED_SYRINGE | INTRAMUSCULAR | Status: DC
  Administered 2022-08-06: 40 mg via SUBCUTANEOUS
  Filled 2022-08-06: qty 0.4

## 2022-08-06 NOTE — Progress Notes (Signed)
Progress Note   Patient: Jennifer Hayes WCB:[id number] DOB: 05/08/1941 DOA: 08/05/2022     1 DOS: the patient was seen and examined on 08/06/2022     Assessment and Plan: * Sepsis ([place]) Clinical sepsis, present on admission with right middle lobe pneumonia, fever, tachycardia and leukocytosis and acute respiratory failure.  Continue Levaquin.  Follow-up blood cultures.  Lobar pneumonia ([place]) Continue Levaquin.  Follow-up cultures.  Acute respiratory failure with hypoxia (HCC) The patient does have chronic respiratory failure but only wears her oxygen as needed.  Pulse ox was 87% on 2 L.  Patient had increasing oxygen requirements overnight but the patient has gel nails and we will likely not get a accurate pulse ox.  Asked nursing staff to check pulse ox on her ear.  Currently on 2 L  COPD with acute exacerbation (HCC) Continue Solu-Medrol 40 mg IV daily.  Add nebulizers DuoNeb, budesonide and Brovana.  Diarrhea Stool studies negative.  Chronic diastolic CHF (congestive heart failure) (HCC) No signs of heart failure currently.  Discontinue IV fluids.  Weakness Physical therapy evaluation  Hyperlipidemia Continue Lipitor  GERD (gastroesophageal reflux disease) Continue PPI  Tobacco abuse Nicotine patch prescribed  Overweight (BMI 25.0-29.9) BMI 29.76 with current height and weight in computer        Subjective: Patient feeling a little bit better.  Still with some cough and shortness of breath and wheeze.  Physical Exam: Vitals:   08/06/22 0215 08/06/22 0218 08/06/22 0434 08/06/22 0849  BP:   (!) 115/49 (!) 135/57  Pulse:   81 88  Resp:   20 17  Temp:   (!) 97.4 F (36.3 C) 97.8 F (36.6 C)  TempSrc:   Oral Oral  SpO2: 96% 94% 95% 92%  Weight:      Height:       Physical Exam HENT:     Head: Normocephalic.     Mouth/Throat:     Pharynx: No oropharyngeal exudate.  Eyes:     General: Lids are normal.     Conjunctiva/sclera: Conjunctivae  normal.  Cardiovascular:     Rate and Rhythm: Normal rate and regular rhythm.     Heart sounds: Normal heart sounds, S1 normal and S2 normal.  Pulmonary:     Breath sounds: Examination of the right-middle field reveals decreased breath sounds and wheezing. Examination of the left-middle field reveals decreased breath sounds and wheezing. Examination of the right-lower field reveals decreased breath sounds and wheezing. Examination of the left-lower field reveals decreased breath sounds and wheezing. Decreased breath sounds and wheezing present. No rhonchi or rales.  Abdominal:     Palpations: Abdomen is soft.     Tenderness: There is no abdominal tenderness.  Musculoskeletal:     Right lower leg: Swelling present.     Left lower leg: Swelling present.  Skin:    General: Skin is warm.     Findings: No rash.  Neurological:     Mental Status: She is alert and oriented to person, place, and time.     Data Reviewed: Stool studies negative, COVID test negative, creatinine 0.64, BNP 147, white blood count 14.3  Family Communication: Left message for patient's daughter  Disposition: Status is: Inpatient Remains inpatient appropriate because: Still requiring IV antibiotics and IV steroids for pneumonia and COPD exacerbation.  Still has bronchospasm and wheeze  Planned Discharge Destination: Home    Time spent: 28 minutes  Author: Loletha Grayer, MD 08/06/2022 1:55 PM  For on call review [email or web address].

## 2022-08-06 NOTE — Progress Notes (Addendum)
Patient noted with fluctuating O2 sats 87-88% on O2 at 6 lpm via [place]. RT on unit to give neb tx at this time, assessed patient's respiratory status with this nurse as well at bedside. Pt's HOB elevated. Pt in no visible distress, speaking with this nurse and RT. Denies shortness of breath, however reports feeling "weak". Nebulizer tx given with no improvement in O2 saturation. O2 sensor probe adjusted with minimal improvement. Provider on call notified via RT for HFNC at present time. Pt placed on HFNC at 9l per RT to be  titrated down to meet appropriate parameters per order initiated by Sharion Settler, NP. O2 sat >90% while awake and greater than 85% while asleep. Pt sitting up in bed drinking coffee at present time, all other vital signs within limits.

## 2022-08-06 NOTE — Assessment & Plan Note (Addendum)
BMI 29.76 with current height and weight in computer

## 2022-08-07 DIAGNOSIS — J181 Lobar pneumonia, unspecified organism: Secondary | ICD-10-CM | POA: Diagnosis not present

## 2022-08-07 DIAGNOSIS — R531 Weakness: Secondary | ICD-10-CM | POA: Diagnosis not present

## 2022-08-07 DIAGNOSIS — A419 Sepsis, unspecified organism: Secondary | ICD-10-CM | POA: Diagnosis not present

## 2022-08-07 DIAGNOSIS — J9601 Acute respiratory failure with hypoxia: Secondary | ICD-10-CM | POA: Diagnosis not present

## 2022-08-07 LAB — URINALYSIS, COMPLETE (UACMP) WITH MICROSCOPIC
Bilirubin Urine: NEGATIVE
Glucose, UA: NEGATIVE mg/dL
Ketones, ur: NEGATIVE mg/dL
Nitrite: NEGATIVE
Protein, ur: 30 mg/dL — AB
Specific Gravity, Urine: 1.016 (ref 1.005–1.030)
pH: 6 (ref 5.0–8.0)

## 2022-08-07 MED ORDER — GUAIFENESIN-DM 100-10 MG/5ML PO SYRP
5.0000 mL | ORAL_SOLUTION | ORAL | Status: DC | PRN
  Administered 2022-08-07: 5 mL via ORAL
  Filled 2022-08-07: qty 10

## 2022-08-07 MED ORDER — LEVOFLOXACIN 750 MG PO TABS: 750.0000 mg | ORAL_TABLET | Freq: Every day | ORAL | 0 refills | Status: AC

## 2022-08-07 MED ORDER — NICOTINE 21 MG/24HR TD PT24: MEDICATED_PATCH | TRANSDERMAL | 0 refills | Status: DC

## 2022-08-07 MED ORDER — ALBUTEROL SULFATE HFA 108 (90 BASE) MCG/ACT IN AERS: 2.0000 | INHALATION_SPRAY | Freq: Four times a day (QID) | RESPIRATORY_TRACT | 2 refills | Status: AC | PRN

## 2022-08-07 MED ORDER — STERILE WATER FOR INJECTION IJ SOLN
INTRAMUSCULAR | Status: AC
  Administered 2022-08-07: 10 mL
  Filled 2022-08-07: qty 10

## 2022-08-07 MED ORDER — TRELEGY ELLIPTA 100-62.5-25 MCG/ACT IN AEPB: 1.0000 | INHALATION_SPRAY | Freq: Every day | RESPIRATORY_TRACT | 0 refills | Status: DC

## 2022-08-07 MED ORDER — PHENOL 1.4 % MT LIQD
1.0000 | OROMUCOSAL | Status: DC | PRN
  Filled 2022-08-07: qty 177

## 2022-08-07 MED ORDER — PREDNISONE 20 MG PO TABS: ORAL_TABLET | ORAL | 0 refills | Status: DC

## 2022-08-07 NOTE — TOC Initial Note (Signed)
Transition of Care [facility]) - Initial/Assessment Note    Patient Details  Name: Jennifer Hayes MRN: [id number] Date of Birth: 1941-01-12  Transition of Care [facility]) CM/SW Contact:    Beverly Sessions, RN Phone Number: 08/07/2022, 2:19 PM  Clinical Narrative:                  Patient discharge today Patient states that her daughter in law to transport at home PT recommending Rw no PT follow up  Patient agreeable to RW.  Was delivered by [place] with [employer] Patient request home health RN and aide. States she does not have a preference of home health agency. Referral made to [facility]        Patient Goals and CMS Choice        Expected Discharge Plan and Services           Expected Discharge Date: 08/07/22                                    Prior Living Arrangements/Services                       Activities of Daily Living Home Assistive Devices/Equipment: Eyeglasses ADL Screening (condition at time of admission) Patient's cognitive ability adequate to safely complete daily activities?: Yes Is the patient deaf or have difficulty hearing?: No Does the patient have difficulty seeing, even when wearing glasses/contacts?: No Does the patient have difficulty concentrating, remembering, or making decisions?: No Patient able to express need for assistance with ADLs?: Yes Does the patient have difficulty dressing or bathing?: Yes Independently performs ADLs?: No Does the patient have difficulty walking or climbing stairs?: Yes Weakness of Legs: Both Weakness of Arms/Hands: Both  Permission Sought/Granted                  Emotional Assessment              Admission diagnosis:  Sepsis ([place]) [A41.9] Patient Active Problem List   Diagnosis Date Noted   Overweight (BMI 25.0-29.9) 08/06/2022   Sepsis ([place]) 08/05/2022   Lobar pneumonia ([place]) 08/05/2022   Diarrhea 08/05/2022   Weakness 08/05/2022   Tobacco abuse  counseling 08/03/2022   Frequent bowel movements 07/14/2022   Breast pain, left 05/19/2022   Wheezing 05/19/2022   Chronic right shoulder pain 11/06/2021   COPD with acute exacerbation ([place]) 06/21/2021   Hyperlipidemia 06/21/2021   Acute respiratory failure with hypoxia ([place]) 06/21/2021   Chronic diastolic CHF (congestive heart failure) ([place]) 06/21/2021   Tobacco abuse 06/21/2021   Depression, recurrent ([place]) 03/20/2021   Memory loss of unknown cause 07/15/2020   MDD (major depressive disorder) 06/02/2019   Cigarette nicotine dependence without complication 24/40/1027   Major depressive disorder in full remission ([place]) 04/07/2019   HTN (hypertension) 12/26/2018   UTI (urinary tract infection) 11/04/2018   Chronic bilateral low back pain 05/09/2018   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 05/09/2018   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/31/2017   Other hyperlipidemia 10/17/2017   Chronic pain syndrome 09/03/2017   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   Lumbar post-laminectomy syndrome 09/03/2017   Chronic pruritus 01/04/2017   Vitamin B12 deficiency 01/01/2017   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   GERD (gastroesophageal reflux disease) 11/23/2016   History of TIA (transient ischemic attack) 11/23/2016   Insomnia 11/23/2016   Rotator cuff arthropathy, right 02/04/2016  Bilateral patellofemoral syndrome 09/10/2015   Primary osteoarthritis of both knees 09/10/2015   DDD (degenerative disc disease), cervical 11/20/2014   PCP:  Corky Downs, MD Pharmacy:   [facility] - [place], [place] - [address] [facility] [address] [place] [address] [place] [address] Phone: [phone] Fax: [phone]  [facility] - [place], [place] - [address] [place] [address] Phone: [phone] Fax: [phone]  [id number] [place], [place] - [address] AT [facility] [address] [facility] [address] [place]  [address] Phone: [phone] Fax: [phone]     Social Determinants of Health (SDOH) Interventions    Readmission Risk Interventions     No data to display

## 2022-08-07 NOTE — Discharge Summary (Signed)
Physician Discharge Summary   Patient: Jennifer Hayes MRN: [id number] DOB: Oct 23, 1940  Admit date:     08/05/2022  Discharge date: 08/07/22  Discharge Physician: Alford Highland   PCP: Corky Downs, MD   Recommendations at discharge:      Discharge Diagnoses: Principal Problem:   Sepsis [facility]) Active Problems:   Lobar pneumonia (HCC)   Acute respiratory failure with hypoxia (HCC)   COPD with acute exacerbation (HCC)   Diarrhea   Chronic diastolic CHF (congestive heart failure) (HCC)   GERD (gastroesophageal reflux disease)   Hyperlipidemia   Weakness   Tobacco abuse   Overweight (BMI 25.0-29.9)   Hospital Course: Patient was admitted to the hospital on 08/05/2022 and discharged on 08/07/2022.  Patient came in with weakness and shortness of breath.  She was found to have clinical sepsis lobar pneumonia and acute hypoxic respiratory failure.  Patient improved during the hospital course.  The ER physician gave her triple antibiotics.  I prescribed Levaquin.  The patient received IV Solu-Medrol during the hospital course.  The patient does have a gel nails so an accurate pulse ox will be difficult to obtain.  We checked the pulse ox with ambulation prior to discharge and she held her saturations at 94%.  Patient felt much better at the time of discharge and was discharged home in stable condition.  Assessment and Plan: * Sepsis (HCC) Clinical sepsis, present on admission with right middle lobe pneumonia, fever, tachycardia and leukocytosis and acute respiratory failure.  Continue Levaquin for 3 more days orally.  Blood cultures negative.  Urine culture still pending.  Lobar pneumonia (HCC) Continue Levaquin for 3 more days orally upon discharge  Acute respiratory failure with hypoxia (HCC) Pulse ox was 87% on 2 L.  Since the patient has gel nails and we will likely not get a accurate pulse ox.  Asked nursing staff to check pulse ox on her ear.  Patient was able to hold  her saturations with ambulation and 94% on room air.  Patient can go back to wearing oxygen at night and as needed at home.  COPD with acute exacerbation (HCC) Patient was given Solu-Medrol here and nebulizer treatments.  Can go back on her Trelegy inhaler and a few more days of prednisone at home.  Diarrhea Stool studies negative.  Chronic diastolic CHF (congestive heart failure) (HCC) No signs of heart failure currently.   Weakness Did well with physical therapy  Hyperlipidemia Continue Lipitor  GERD (gastroesophageal reflux disease) Continue PPI  Tobacco abuse Nicotine patch prescribed  Overweight (BMI 25.0-29.9) BMI 29.76 with current height and weight in computer         Consultants: None Procedures performed: None Disposition: Home health Diet recommendation:  Carb modified diet DISCHARGE MEDICATION: Allergies as of 08/07/2022       Reactions   Bupropion Swelling   headache   Penicillins Hives        Medication List     STOP taking these medications    Ipratropium-Albuterol 20-100 MCG/ACT Aers respimat Commonly known as: COMBIVENT       TAKE these medications    albuterol 108 (90 Base) MCG/ACT inhaler Commonly known as: VENTOLIN HFA Inhale 2 puffs into the lungs every 6 (six) hours as needed for wheezing or shortness of breath.   celecoxib 200 MG capsule Commonly known as: CELEBREX Take 1 capsule (200 mg total) by mouth daily at 2 PM.   cyanocobalamin 1000 MCG tablet Commonly known as: VITAMIN B12 Take 1,000 mcg  by mouth daily.   esomeprazole 40 MG capsule Commonly known as: NEXIUM Take 40 mg by mouth daily.   levofloxacin 750 MG tablet Commonly known as: Levaquin Take 1 tablet (750 mg total) by mouth at bedtime for 3 days.   nicotine 21 mg/24hr patch Commonly known as: NICODERM CQ - dosed in mg/24 hours One  patch chest wall daily (okay to substitute generic) Start taking on: August 08, 2022   predniSONE 20 MG  tablet Commonly known as: DELTASONE Two tabs po daily for three days Start taking on: August 08, 2022   traZODone 50 MG tablet Commonly known as: DESYREL Take 0.5-1 tablets (25-50 mg total) by mouth at bedtime as needed for sleep.   Trelegy Ellipta 100-62.5-25 MCG/ACT Aepb Generic drug: Fluticasone-Umeclidin-Vilant Inhale 1 puff into the lungs daily.        Follow-up Information     Corky Downs, MD. Go on 08/11/2022.   Specialties: Internal Medicine, Cardiology Why: 11:20am appointment Contact information: [address] [place] [address] [phone]                Discharge Exam: Ceasar Mons Weights   08/05/22 1713  Weight: 76.2 kg   Physical Exam HENT:     Head: Normocephalic.     Mouth/Throat:     Pharynx: No oropharyngeal exudate.  Eyes:     General: Lids are normal.     Conjunctiva/sclera: Conjunctivae normal.  Cardiovascular:     Rate and Rhythm: Normal rate and regular rhythm.     Heart sounds: Normal heart sounds, S1 normal and S2 normal.  Pulmonary:     Breath sounds: Examination of the right-lower field reveals decreased breath sounds. Examination of the left-lower field reveals decreased breath sounds. Decreased breath sounds present. No wheezing, rhonchi or rales.  Abdominal:     Palpations: Abdomen is soft.     Tenderness: There is no abdominal tenderness.  Musculoskeletal:     Right lower leg: Swelling present.     Left lower leg: Swelling present.  Skin:    General: Skin is warm.     Findings: No rash.  Neurological:     Mental Status: She is alert and oriented to person, place, and time.      Condition at discharge: stable  The results of significant diagnostics from this hospitalization (including imaging, microbiology, ancillary and laboratory) are listed below for reference.   Imaging Studies: DG Shoulder Right  Result Date: 08/06/2022 CLINICAL DATA:  Pain EXAM: RIGHT SHOULDER - 2+ VIEW COMPARISON:  07/16/2019 FINDINGS: No  fracture or dislocation is seen. Severe degenerative changes are noted in right shoulder with interval progression. Deformity in the lateral end of right clavicle may be residual from previous injury. Increased interstitial markings are seen in right upper lung field. IMPRESSION: No recent fracture or dislocation is seen in right shoulder. Severe degenerative changes are noted with interval progression. Increased density in right upper lung fields may suggest pneumonia. Electronically Signed   By: Ernie Avena M.D.   On: 08/06/2022 16:55   CT Head Wo Contrast  Result Date: 08/05/2022 CLINICAL DATA:  Generalized weakness with headache and frequent falls starting yesterday. Patient hit her head with 1 of the falls. EXAM: CT HEAD WITHOUT CONTRAST CT CERVICAL SPINE WITHOUT CONTRAST TECHNIQUE: Multidetector CT imaging of the head and cervical spine was performed following the standard protocol without intravenous contrast. Multiplanar CT image reconstructions of the cervical spine were also generated. RADIATION DOSE REDUCTION: This exam was performed according to  the departmental dose-optimization program which includes automated exposure control, adjustment of the mA and/or kV according to patient size and/or use of iterative reconstruction technique. COMPARISON:  Head CT 03/22/2020 FINDINGS: CT HEAD FINDINGS Brain: There is no evidence for acute hemorrhage, hydrocephalus, mass lesion, or abnormal extra-axial fluid collection. No definite CT evidence for acute infarction. Diffuse loss of parenchymal volume is consistent with atrophy. Patchy low attenuation in the deep hemispheric and periventricular white matter is nonspecific, but likely reflects chronic microvascular ischemic demyelination. Vascular: No hyperdense vessel or unexpected calcification. Skull: No evidence for fracture. No worrisome lytic or sclerotic lesion. Sinuses/Orbits: The visualized paranasal sinuses and mastoid air cells are clear.  Visualized portions of the globes and intraorbital fat are unremarkable. Other: None. CT CERVICAL SPINE FINDINGS Alignment: Trace anterolisthesis of C3 on 4 and C4 on 5 likely related to the advanced facet osteoarthropathy at those levels. Patient is fused across the C5-6 interspace with marked loss of disc height at C6-7. Skull base and vertebrae: No acute fracture. No primary bone lesion or focal pathologic process. Soft tissues and spinal canal: No prevertebral fluid or swelling. No visible canal hematoma. Disc levels:  See above. Upper chest: 16 mm nodular focus of consolidative opacity identified right lung apex with 12 mm nodular focus in the medial left apex. Other: None IMPRESSION: 1. No acute intracranial abnormality. 2. Atrophy with chronic small vessel ischemic disease. 3. No evidence for cervical spine fracture or traumatic subluxation. 4. Degenerative changes in the cervical spine as above. 5. 16 mm nodular focus of consolidative opacity in the right lung apex with 12 mm nodular focus in the medial left apex. These may be infectious/inflammatory etiology, but neoplasm not excluded. Chest CT recommended to further evaluate. Electronically Signed   By: Misty Stanley M.D.   On: 08/05/2022 16:44   CT Cervical Spine Wo Contrast  Result Date: 08/05/2022 CLINICAL DATA:  Generalized weakness with headache and frequent falls starting yesterday. Patient hit her head with 1 of the falls. EXAM: CT HEAD WITHOUT CONTRAST CT CERVICAL SPINE WITHOUT CONTRAST TECHNIQUE: Multidetector CT imaging of the head and cervical spine was performed following the standard protocol without intravenous contrast. Multiplanar CT image reconstructions of the cervical spine were also generated. RADIATION DOSE REDUCTION: This exam was performed according to the departmental dose-optimization program which includes automated exposure control, adjustment of the mA and/or kV according to patient size and/or use of iterative  reconstruction technique. COMPARISON:  Head CT 03/22/2020 FINDINGS: CT HEAD FINDINGS Brain: There is no evidence for acute hemorrhage, hydrocephalus, mass lesion, or abnormal extra-axial fluid collection. No definite CT evidence for acute infarction. Diffuse loss of parenchymal volume is consistent with atrophy. Patchy low attenuation in the deep hemispheric and periventricular white matter is nonspecific, but likely reflects chronic microvascular ischemic demyelination. Vascular: No hyperdense vessel or unexpected calcification. Skull: No evidence for fracture. No worrisome lytic or sclerotic lesion. Sinuses/Orbits: The visualized paranasal sinuses and mastoid air cells are clear. Visualized portions of the globes and intraorbital fat are unremarkable. Other: None. CT CERVICAL SPINE FINDINGS Alignment: Trace anterolisthesis of C3 on 4 and C4 on 5 likely related to the advanced facet osteoarthropathy at those levels. Patient is fused across the C5-6 interspace with marked loss of disc height at C6-7. Skull base and vertebrae: No acute fracture. No primary bone lesion or focal pathologic process. Soft tissues and spinal canal: No prevertebral fluid or swelling. No visible canal hematoma. Disc levels:  See above. Upper chest: 16 mm nodular  focus of consolidative opacity identified right lung apex with 12 mm nodular focus in the medial left apex. Other: None IMPRESSION: 1. No acute intracranial abnormality. 2. Atrophy with chronic small vessel ischemic disease. 3. No evidence for cervical spine fracture or traumatic subluxation. 4. Degenerative changes in the cervical spine as above. 5. 16 mm nodular focus of consolidative opacity in the right lung apex with 12 mm nodular focus in the medial left apex. These may be infectious/inflammatory etiology, but neoplasm not excluded. Chest CT recommended to further evaluate. Electronically Signed   By: Kennith CenterEric  Mansell M.D.   On: 08/05/2022 16:44   DG Chest 2 View  Result  Date: 08/05/2022 CLINICAL DATA:  Suspected sepsis. EXAM: CHEST - 2 VIEW COMPARISON:  October 04, 2021 FINDINGS: There is infiltrate in the right mid lung/perihilar region. The heart, hila, and mediastinum are otherwise unremarkable. No pneumothorax. No nodules or masses. No other abnormalities. IMPRESSION: Right mid lung/perihilar infiltrate worrisome for pneumonia. Recommend short-term follow-up imaging to ensure resolution. Electronically Signed   By: Gerome Samavid  Williams III M.D.   On: 08/05/2022 14:43    Microbiology: Results for orders placed or performed during the hospital encounter of 08/05/22  Culture, blood (Routine x 2)     Status: None (Preliminary result)   Collection Time: 08/05/22  2:25 PM   Specimen: BLOOD  Result Value Ref Range Status   Specimen Description BLOOD BLOOD RIGHT FOREARM  Final   Special Requests   Final    BOTTLES DRAWN AEROBIC AND ANAEROBIC Blood Culture adequate volume   Culture   Final    NO GROWTH 2 DAYS Performed at [facility], [address] [place], [place] [address]    Report Status PENDING  Incomplete  Resp Panel by RT-PCR (Flu A&B, Covid) Anterior Nasal Swab     Status: None   Collection Time: 08/05/22  5:10 PM   Specimen: Anterior Nasal Swab  Result Value Ref Range Status   SARS Coronavirus 2 by RT PCR NEGATIVE NEGATIVE Final    Comment: (NOTE) SARS-CoV-2 target nucleic acids are NOT DETECTED.  The SARS-CoV-2 RNA is generally detectable in upper respiratory specimens during the acute phase of infection. The lowest concentration of SARS-CoV-2 viral copies this assay can detect is 138 copies/mL. A negative result does not preclude SARS-Cov-2 infection and should not be used as the sole basis for treatment or other patient management decisions. A negative result may occur with  improper specimen collection/handling, submission of specimen other than nasopharyngeal swab, presence of viral mutation(s) within the areas targeted by this  assay, and inadequate number of viral copies(<138 copies/mL). A negative result must be combined with clinical observations, patient history, and epidemiological information. The expected result is Negative.  Fact Sheet for Patients:  BloggerCourse.comhttps://www.fda.gov/media/152166/download  Fact Sheet for Healthcare Providers:  SeriousBroker.ithttps://www.fda.gov/media/152162/download  This test is no t yet approved or cleared by the [place] FDA and  has been authorized for detection and/or diagnosis of SARS-CoV-2 by FDA under an Emergency Use Authorization (EUA). This EUA will remain  in effect (meaning this test can be used) for the duration of the COVID-19 declaration under Section 564(b)(1) of the Act, 21 U.S.C.section 360bbb-3(b)(1), unless the authorization is terminated  or revoked sooner.       Influenza A by PCR NEGATIVE NEGATIVE Final   Influenza B by PCR NEGATIVE NEGATIVE Final    Comment: (NOTE) The Xpert Xpress SARS-CoV-2/FLU/RSV plus assay is intended as an aid in the diagnosis of influenza from Nasopharyngeal  swab specimens and should not be used as a sole basis for treatment. Nasal washings and aspirates are unacceptable for Xpert Xpress SARS-CoV-2/FLU/RSV testing.  Fact Sheet for Patients: BloggerCourse.com  Fact Sheet for Healthcare Providers: SeriousBroker.it  This test is not yet approved or cleared by the [place] FDA and has been authorized for detection and/or diagnosis of SARS-CoV-2 by FDA under an Emergency Use Authorization (EUA). This EUA will remain in effect (meaning this test can be used) for the duration of the COVID-19 declaration under Section 564(b)(1) of the Act, 21 U.S.C. section 360bbb-3(b)(1), unless the authorization is terminated or revoked.  Performed at [facility], [address] [place], [place] [address]   Gastrointestinal Panel by PCR , Stool     Status: None   Collection Time:  08/05/22  5:45 PM   Specimen: Stool  Result Value Ref Range Status   Campylobacter species NOT DETECTED NOT DETECTED Final   Plesimonas shigelloides NOT DETECTED NOT DETECTED Final   Salmonella species NOT DETECTED NOT DETECTED Final   Yersinia enterocolitica NOT DETECTED NOT DETECTED Final   Vibrio species NOT DETECTED NOT DETECTED Final   Vibrio cholerae NOT DETECTED NOT DETECTED Final   Enteroaggregative E coli (EAEC) NOT DETECTED NOT DETECTED Final   Enteropathogenic E coli (EPEC) NOT DETECTED NOT DETECTED Final   Enterotoxigenic E coli (ETEC) NOT DETECTED NOT DETECTED Final   Shiga like toxin producing E coli (STEC) NOT DETECTED NOT DETECTED Final   Shigella/Enteroinvasive E coli (EIEC) NOT DETECTED NOT DETECTED Final   Cryptosporidium NOT DETECTED NOT DETECTED Final   Cyclospora cayetanensis NOT DETECTED NOT DETECTED Final   Entamoeba histolytica NOT DETECTED NOT DETECTED Final   Giardia lamblia NOT DETECTED NOT DETECTED Final   Adenovirus F40/41 NOT DETECTED NOT DETECTED Final   Astrovirus NOT DETECTED NOT DETECTED Final   Norovirus GI/GII NOT DETECTED NOT DETECTED Final   Rotavirus A NOT DETECTED NOT DETECTED Final   Sapovirus (I, II, IV, and V) NOT DETECTED NOT DETECTED Final    Comment: Performed at [facility], [address] [place], [place] [address]  C Difficile Quick Screen w PCR reflex     Status: None   Collection Time: 08/05/22  5:46 PM   Specimen: STOOL  Result Value Ref Range Status   C Diff antigen NEGATIVE NEGATIVE Final   C Diff toxin NEGATIVE NEGATIVE Final   C Diff interpretation No C. difficile detected.  Final    Comment: Performed at [facility], [address] [place], [place] [address]  Culture, blood (Routine X 2) w Reflex to ID Panel     Status: None (Preliminary result)   Collection Time: 08/06/22  8:30 AM   Specimen: BLOOD  Result Value Ref Range Status   Specimen Description BLOOD LEFT ANTECUBITAL  Final   Special  Requests   Final    BOTTLES DRAWN AEROBIC AND ANAEROBIC Blood Culture results may not be optimal due to an excessive volume of blood received in culture bottles   Culture   Final    NO GROWTH 1 DAY Performed at [facility], [address] [place], [place] [address]    Report Status PENDING  Incomplete    Labs: CBC: Recent Labs  Lab 08/05/22 1425 08/06/22 0421  WBC 18.1* 14.3*  NEUTROABS 15.2*  --   HGB 13.2 11.8*  HCT 39.5 34.6*  MCV 88.8 88.5  PLT 175 156   Basic Metabolic Panel: Recent Labs  Lab 08/05/22 1425  08/06/22 0421  NA 137 139  K 3.8 3.6  CL 104 105  CO2 26 24  GLUCOSE 126* 184*  BUN 23 23  CREATININE 0.68 0.64  CALCIUM 8.6* 8.4*   Liver Function Tests: Recent Labs  Lab 08/05/22 1425  AST 15  ALT 11  ALKPHOS 93  BILITOT 1.4*  PROT 7.1  ALBUMIN 3.4*     Discharge time spent: greater than 30 minutes.  Signed: Alford Highland, MD [facility] 08/07/2022

## 2022-08-07 NOTE — Evaluation (Signed)
Physical Therapy Evaluation Patient Details Name: Jennifer Hayes MRN: [id number] DOB: 1941/09/10 Today's Date: 08/07/2022  History of Present Illness  Pt admitted for sepsis with complaints of fever and ARF. Also noted to have pneumonia. History includes 2L chronic O2 use as needed, CHF, GERD, COPD, depression, and CVA.  Clinical Impression  Pt is a pleasant 81 year old female who was admitted for sepsis. Pt performs bed mobility with independence, transfers with mod I, and ambulation with supervision and RW. Pt demonstrates deficits with balance/mobility. Would benefit from skilled PT to address above deficits and promote optimal return to PLOF. Will maintain on caseload during admission, however am currently not recommending further follow up therapy at this time.   SaO2 on room air at rest = 99% SaO2 on room air while ambulating = 93% SaO2 on n/a liters of O2 while ambulating = n/a%      Recommendations for follow up therapy are one component of a multi-disciplinary discharge planning process, led by the attending physician.  Recommendations may be updated based on patient status, additional functional criteria and insurance authorization.  Follow Up Recommendations No PT follow up      Assistance Recommended at Discharge None  Patient can return home with the following       Equipment Recommendations Rolling walker (2 wheels)  Recommendations for Other Services       Functional Status Assessment Patient has not had a recent decline in their functional status     Precautions / Restrictions Precautions Precautions: Fall Restrictions Weight Bearing Restrictions: No      Mobility  Bed Mobility Overal bed mobility: Independent             General bed mobility comments: safe technique    Transfers Overall transfer level: Modified independent Equipment used: Rolling walker (2 wheels)               General transfer comment: safe technique with  upright posture noted    Ambulation/Gait Ambulation/Gait assistance: Supervision Gait Distance (Feet): 200 Feet Assistive device: Rolling walker (2 wheels) Gait Pattern/deviations: Step-through pattern       General Gait Details: aqmbulated with ease around RN station. All mobility performed on RA with sats ranging from 92-98%. HR elevates up to 117bpm.  Stairs            Wheelchair Mobility    Modified Rankin (Stroke Patients Only)       Balance Overall balance assessment: History of Falls, Modified Independent                                           Pertinent Vitals/Pain Pain Assessment Pain Assessment: No/denies pain    Home Living Family/patient expects to be discharged to:: Private residence Living Arrangements: Alone Available Help at Discharge: Family;Neighbor Type of Home: Apartment Home Access: Level entry       Home Layout: One level Home Equipment: None      Prior Function Prior Level of Function : Independent/Modified Independent;Driving;History of Falls (last six months)             Mobility Comments: reports several recent falls ADLs Comments: reports driving and indep with all ADLs     Hand Dominance        Extremity/Trunk Assessment   Upper Extremity Assessment Upper Extremity Assessment: Overall WFL for tasks assessed    Lower Extremity Assessment Lower  Extremity Assessment: Overall WFL for tasks assessed       Communication   Communication: No difficulties  Cognition Arousal/Alertness: Awake/alert Behavior During Therapy: WFL for tasks assessed/performed Overall Cognitive Status: Within Functional Limits for tasks assessed                                 General Comments: pleasant and agreeable to session        General Comments      Exercises     Assessment/Plan    PT Assessment Patient needs continued PT services  PT Problem List Decreased strength;Decreased  mobility;Decreased activity tolerance;Decreased balance       PT Treatment Interventions DME instruction;Gait training;Balance training    PT Goals (Current goals can be found in the Care Plan section)  Acute Rehab PT Goals Patient Stated Goal: to go home PT Goal Formulation: With patient Time For Goal Achievement: 08/21/22 Potential to Achieve Goals: Good    Frequency Min 2X/week     Co-evaluation               AM-PAC PT "6 Clicks" Mobility  Outcome Measure Help needed turning from your back to your side while in a flat bed without using bedrails?: None Help needed moving from lying on your back to sitting on the side of a flat bed without using bedrails?: None Help needed moving to and from a bed to a chair (including a wheelchair)?: A Little Help needed standing up from a chair using your arms (e.g., wheelchair or bedside chair)?: A Little Help needed to walk in hospital room?: A Little Help needed climbing 3-5 steps with a railing? : A Little 6 Click Score: 20    End of Session Equipment Utilized During Treatment: Gait belt;Oxygen Activity Tolerance: Patient tolerated treatment well Patient left: in chair;with chair alarm set Nurse Communication: Mobility status PT Visit Diagnosis: Unsteadiness on feet (R26.81)    Time: 1308-6578 PT Time Calculation (min) (ACUTE ONLY): 27 min   Charges:   PT Evaluation $PT Eval Low Complexity: 1 Low PT Treatments $Gait Training: 8-22 mins        Jennifer Hayes, PT, DPT, GCS [phone]   Jennifer Hayes 08/07/2022, 11:44 AM

## 2022-08-07 NOTE — Plan of Care (Signed)
  Problem: Activity: Goal: Ability to tolerate increased activity will improve Outcome: Progressing   Problem: Clinical Measurements: Goal: Ability to maintain a body temperature in the normal range will improve Outcome: Progressing   

## 2022-08-07 NOTE — Progress Notes (Signed)
Pt discharged per MD order. IV removed. Discharge instructions reviewed with pt. Pt verbalized understanding. All questions answered to pt satisfaction. Pt taken out in wheelchair by volunteer.  

## 2022-08-08 LAB — URINE CULTURE: Culture: >=100000 — AB

## 2022-08-08 NOTE — ED Provider Notes (Signed)
Urine culture positive for greater than 100,000 colonies of yeast.  I have called Dr. Paticia Stack office and left a message on his voicemail.  Kathlee Nations will follow up tomorrow.  Patient was admitted and has already been discharged.   Nena Polio, MD 08/08/22 [phone]

## 2022-08-09 ENCOUNTER — Telehealth: Payer: Self-pay | Admitting: Internal Medicine

## 2022-08-09 NOTE — Telephone Encounter (Signed)
HOSPITAL MEDICINE TELEPHONE ENCOUNTER NOTE  Notified by nursing at the [facility] emergency department that patient's urine culture obtained on 10/30 during the patient's recent hospitalization has come back revealing greater than 100,000 colonies of yeast.  Chart reviewed, it seems that the patient was discharged on a course of of levofloxacin for treatment of sepsis and pneumonia.  Considering this urinary culture finding is of equivocal clinical significance I contacted the patient.  She states that she is feeling better with improving strength daily, improving oral intake and improving shortness of breath/weakness.  Patient denies any difficulty with urination.  She denies dysuria or blood in the urine.  Patient and the patient's improving clinical symptoms I do not believe that this recent urine culture result is clinically relevant.  I have instructed the patient to continue to complete her antibiotic course and to follow closely in the outpatient setting with her primary care provider.  She has additionally been instructed to return to our emergency department if she develops any worsening symptoms such as increasing weakness, fevers or inability to tolerate oral intake.  Vernelle Emerald MD [facility]

## 2022-08-10 LAB — CULTURE, BLOOD (ROUTINE X 2)
Culture: NO GROWTH
Special Requests: ADEQUATE

## 2022-08-11 ENCOUNTER — Inpatient Hospital Stay: Admitting: Nurse Practitioner

## 2022-08-11 ENCOUNTER — Other Ambulatory Visit: Payer: Self-pay | Admitting: *Deleted

## 2022-08-11 LAB — CULTURE, BLOOD (ROUTINE X 2): Culture: NO GROWTH

## 2022-08-11 MED ORDER — CIPROFLOXACIN HCL 250 MG PO TABS: 250.0000 mg | ORAL_TABLET | Freq: Two times a day (BID) | ORAL | 0 refills | Status: AC

## 2022-08-18 ENCOUNTER — Inpatient Hospital Stay: Payer: Medicare Other

## 2022-08-18 ENCOUNTER — Emergency Department: Payer: Medicare Other

## 2022-08-18 ENCOUNTER — Other Ambulatory Visit: Payer: Self-pay

## 2022-08-18 ENCOUNTER — Inpatient Hospital Stay
Admission: EM | Admit: 2022-08-18 | Discharge: 2022-08-18 | DRG: 193 | Discharge status: Against Medical Advice | Payer: Medicare Other | Attending: Internal Medicine | Admitting: Internal Medicine

## 2022-08-18 ENCOUNTER — Encounter: Payer: Self-pay | Admitting: Radiology

## 2022-08-18 DIAGNOSIS — I110 Hypertensive heart disease with heart failure: Secondary | ICD-10-CM | POA: Diagnosis present

## 2022-08-18 DIAGNOSIS — I5032 Chronic diastolic (congestive) heart failure: Secondary | ICD-10-CM | POA: Diagnosis present

## 2022-08-18 DIAGNOSIS — Z66 Do not resuscitate: Secondary | ICD-10-CM | POA: Diagnosis present

## 2022-08-18 DIAGNOSIS — I2510 Atherosclerotic heart disease of native coronary artery without angina pectoris: Secondary | ICD-10-CM | POA: Diagnosis present

## 2022-08-18 DIAGNOSIS — F329 Major depressive disorder, single episode, unspecified: Secondary | ICD-10-CM | POA: Diagnosis present

## 2022-08-18 DIAGNOSIS — E785 Hyperlipidemia, unspecified: Secondary | ICD-10-CM | POA: Diagnosis present

## 2022-08-18 DIAGNOSIS — Z888 Allergy status to other drugs, medicaments and biological substances status: Secondary | ICD-10-CM

## 2022-08-18 DIAGNOSIS — J9620 Acute and chronic respiratory failure, unspecified whether with hypoxia or hypercapnia: Secondary | ICD-10-CM | POA: Diagnosis not present

## 2022-08-18 DIAGNOSIS — Z8673 Personal history of transient ischemic attack (TIA), and cerebral infarction without residual deficits: Secondary | ICD-10-CM | POA: Diagnosis not present

## 2022-08-18 DIAGNOSIS — J189 Pneumonia, unspecified organism: Secondary | ICD-10-CM | POA: Diagnosis present

## 2022-08-18 DIAGNOSIS — Z1152 Encounter for screening for COVID-19: Secondary | ICD-10-CM | POA: Diagnosis not present

## 2022-08-18 DIAGNOSIS — I700 Atherosclerosis of aorta: Secondary | ICD-10-CM | POA: Diagnosis present

## 2022-08-18 DIAGNOSIS — Z79899 Other long term (current) drug therapy: Secondary | ICD-10-CM

## 2022-08-18 DIAGNOSIS — J9621 Acute and chronic respiratory failure with hypoxia: Secondary | ICD-10-CM | POA: Diagnosis present

## 2022-08-18 DIAGNOSIS — J9600 Acute respiratory failure, unspecified whether with hypoxia or hypercapnia: Secondary | ICD-10-CM | POA: Diagnosis present

## 2022-08-18 DIAGNOSIS — J440 Chronic obstructive pulmonary disease with acute lower respiratory infection: Secondary | ICD-10-CM | POA: Diagnosis present

## 2022-08-18 DIAGNOSIS — Z9981 Dependence on supplemental oxygen: Secondary | ICD-10-CM

## 2022-08-18 DIAGNOSIS — Z720 Tobacco use: Secondary | ICD-10-CM | POA: Diagnosis not present

## 2022-08-18 DIAGNOSIS — Z7951 Long term (current) use of inhaled steroids: Secondary | ICD-10-CM

## 2022-08-18 DIAGNOSIS — G894 Chronic pain syndrome: Secondary | ICD-10-CM | POA: Diagnosis present

## 2022-08-18 DIAGNOSIS — K219 Gastro-esophageal reflux disease without esophagitis: Secondary | ICD-10-CM | POA: Diagnosis present

## 2022-08-18 DIAGNOSIS — J441 Chronic obstructive pulmonary disease with (acute) exacerbation: Secondary | ICD-10-CM | POA: Diagnosis present

## 2022-08-18 DIAGNOSIS — Z5329 Procedure and treatment not carried out because of patient's decision for other reasons: Secondary | ICD-10-CM | POA: Diagnosis present

## 2022-08-18 DIAGNOSIS — Z880 Allergy status to penicillin: Secondary | ICD-10-CM | POA: Diagnosis not present

## 2022-08-18 DIAGNOSIS — K449 Diaphragmatic hernia without obstruction or gangrene: Secondary | ICD-10-CM | POA: Diagnosis present

## 2022-08-18 DIAGNOSIS — F17210 Nicotine dependence, cigarettes, uncomplicated: Secondary | ICD-10-CM | POA: Diagnosis present

## 2022-08-18 DIAGNOSIS — Z791 Long term (current) use of non-steroidal anti-inflammatories (NSAID): Secondary | ICD-10-CM

## 2022-08-18 DIAGNOSIS — R0602 Shortness of breath: Principal | ICD-10-CM

## 2022-08-18 DIAGNOSIS — R0902 Hypoxemia: Secondary | ICD-10-CM

## 2022-08-18 LAB — BRAIN NATRIURETIC PEPTIDE: B Natriuretic Peptide: 16.1 pg/mL (ref 0.0–100.0)

## 2022-08-18 LAB — COMPREHENSIVE METABOLIC PANEL
ALT: 13 U/L (ref 0–44)
AST: 17 U/L (ref 15–41)
Albumin: 3.4 g/dL — ABNORMAL LOW (ref 3.5–5.0)
Alkaline Phosphatase: 81 U/L (ref 38–126)
Anion gap: 5 (ref 5–15)
BUN: 10 mg/dL (ref 8–23)
CO2: 29 mmol/L (ref 22–32)
Calcium: 8.6 mg/dL — ABNORMAL LOW (ref 8.9–10.3)
Chloride: 107 mmol/L (ref 98–111)
Creatinine, Ser: 0.6 mg/dL (ref 0.44–1.00)
GFR, Estimated: >60 mL/min (ref >60)
Glucose, Bld: 114 mg/dL — ABNORMAL HIGH (ref 70–99)
Potassium: 4.2 mmol/L (ref 3.5–5.1)
Sodium: 141 mmol/L (ref 135–145)
Total Bilirubin: 0.6 mg/dL (ref 0.3–1.2)
Total Protein: 6.8 g/dL (ref 6.5–8.1)

## 2022-08-18 LAB — CBC WITH DIFFERENTIAL/PLATELET
Abs Immature Granulocytes: 0.07 10*3/uL (ref 0.00–0.07)
Basophils Absolute: 0.1 10*3/uL (ref 0.0–0.1)
Basophils Relative: 1 %
Eosinophils Absolute: 0.3 10*3/uL (ref 0.0–0.5)
Eosinophils Relative: 3 %
HCT: 39.3 % (ref 36.0–46.0)
Hemoglobin: 13 g/dL (ref 12.0–15.0)
Immature Granulocytes: 1 %
Lymphocytes Relative: 18 %
Lymphs Abs: 1.5 10*3/uL (ref 0.7–4.0)
MCH: 29.7 pg (ref 26.0–34.0)
MCHC: 33.1 g/dL (ref 30.0–36.0)
MCV: 89.9 fL (ref 80.0–100.0)
Monocytes Absolute: 0.7 10*3/uL (ref 0.1–1.0)
Monocytes Relative: 8 %
Neutro Abs: 5.8 10*3/uL (ref 1.7–7.7)
Neutrophils Relative %: 69 %
Platelets: 258 10*3/uL (ref 150–400)
RBC: 4.37 MIL/uL (ref 3.87–5.11)
RDW: 12.6 % (ref 11.5–15.5)
WBC: 8.5 10*3/uL (ref 4.0–10.5)
nRBC: 0 % (ref 0.0–0.2)

## 2022-08-18 LAB — TROPONIN I (HIGH SENSITIVITY)
Troponin I (High Sensitivity): 3 ng/L (ref <18)
Troponin I (High Sensitivity): 3 ng/L (ref <18)

## 2022-08-18 LAB — PROCALCITONIN: Procalcitonin: <0.1 ng/mL

## 2022-08-18 LAB — RESP PANEL BY RT-PCR (FLU A&B, COVID) ARPGX2
Influenza A by PCR: NEGATIVE
Influenza B by PCR: NEGATIVE
SARS Coronavirus 2 by RT PCR: NEGATIVE

## 2022-08-18 LAB — LACTIC ACID, PLASMA: Lactic Acid, Venous: 1 mmol/L (ref 0.5–1.9)

## 2022-08-18 MED ORDER — SODIUM CHLORIDE 0.9 % IV SOLN: 1.0000 g | INTRAVENOUS | Status: DC

## 2022-08-18 MED ORDER — SODIUM CHLORIDE 0.9 % IV SOLN: 500.0000 mg | INTRAVENOUS | Status: DC

## 2022-08-18 MED ORDER — ACETAMINOPHEN 650 MG RE SUPP: 650.0000 mg | Freq: Four times a day (QID) | RECTAL | Status: DC | PRN

## 2022-08-18 MED ORDER — ONDANSETRON HCL 4 MG PO TABS: 4.0000 mg | ORAL_TABLET | Freq: Four times a day (QID) | ORAL | Status: DC | PRN

## 2022-08-18 MED ORDER — SODIUM CHLORIDE 0.9 % IV BOLUS
1000.0000 mL | Freq: Once | INTRAVENOUS | Status: AC
  Administered 2022-08-18: 1000 mL via INTRAVENOUS

## 2022-08-18 MED ORDER — NICOTINE 21 MG/24HR TD PT24: 21.0000 mg | MEDICATED_PATCH | Freq: Every day | TRANSDERMAL | Status: DC

## 2022-08-18 MED ORDER — IOHEXOL 350 MG/ML SOLN
75.0000 mL | Freq: Once | INTRAVENOUS | Status: AC | PRN
  Administered 2022-08-18: 75 mL via INTRAVENOUS

## 2022-08-18 MED ORDER — CELECOXIB 200 MG PO CAPS
200.0000 mg | ORAL_CAPSULE | Freq: Every day | ORAL | Status: DC
  Administered 2022-08-18: 200 mg via ORAL
  Filled 2022-08-18: qty 1

## 2022-08-18 MED ORDER — VITAMIN B-12 1000 MCG PO TABS
1000.0000 ug | ORAL_TABLET | Freq: Every day | ORAL | Status: DC
  Administered 2022-08-18: 1000 ug via ORAL
  Filled 2022-08-18: qty 2

## 2022-08-18 MED ORDER — LEVOFLOXACIN IN D5W 750 MG/150ML IV SOLN
750.0000 mg | Freq: Once | INTRAVENOUS | Status: AC
  Administered 2022-08-18: 750 mg via INTRAVENOUS
  Filled 2022-08-18: qty 150

## 2022-08-18 MED ORDER — TRAZODONE HCL 50 MG PO TABS: 25.0000 mg | ORAL_TABLET | Freq: Every evening | ORAL | Status: DC | PRN

## 2022-08-18 MED ORDER — METHYLPREDNISOLONE SODIUM SUCC 40 MG IJ SOLR
40.0000 mg | Freq: Two times a day (BID) | INTRAMUSCULAR | Status: DC
  Administered 2022-08-18: 40 mg via INTRAVENOUS
  Filled 2022-08-18: qty 1

## 2022-08-18 MED ORDER — SODIUM CHLORIDE 0.9% FLUSH: 3.0000 mL | INTRAVENOUS | Status: DC | PRN

## 2022-08-18 MED ORDER — IPRATROPIUM-ALBUTEROL 0.5-2.5 (3) MG/3ML IN SOLN
3.0000 mL | Freq: Four times a day (QID) | RESPIRATORY_TRACT | Status: DC
  Administered 2022-08-18 (×2): 3 mL via RESPIRATORY_TRACT
  Filled 2022-08-18 (×2): qty 3

## 2022-08-18 MED ORDER — SODIUM CHLORIDE 0.9% FLUSH
3.0000 mL | Freq: Two times a day (BID) | INTRAVENOUS | Status: DC
  Administered 2022-08-18: 3 mL via INTRAVENOUS

## 2022-08-18 MED ORDER — PANTOPRAZOLE SODIUM 40 MG PO TBEC
40.0000 mg | DELAYED_RELEASE_TABLET | Freq: Every day | ORAL | Status: DC
  Administered 2022-08-18: 40 mg via ORAL
  Filled 2022-08-18: qty 1

## 2022-08-18 MED ORDER — ALBUTEROL SULFATE (2.5 MG/3ML) 0.083% IN NEBU: 2.5000 mg | INHALATION_SOLUTION | RESPIRATORY_TRACT | Status: DC | PRN

## 2022-08-18 MED ORDER — LEVOFLOXACIN IN D5W 750 MG/150ML IV SOLN: 750.0000 mg | INTRAVENOUS | Status: DC

## 2022-08-18 MED ORDER — IPRATROPIUM-ALBUTEROL 0.5-2.5 (3) MG/3ML IN SOLN
3.0000 mL | Freq: Once | RESPIRATORY_TRACT | Status: AC
  Administered 2022-08-18: 3 mL via RESPIRATORY_TRACT
  Filled 2022-08-18: qty 3

## 2022-08-18 MED ORDER — ONDANSETRON HCL 4 MG/2ML IJ SOLN: 4.0000 mg | Freq: Four times a day (QID) | INTRAMUSCULAR | Status: DC | PRN

## 2022-08-18 MED ORDER — ENOXAPARIN SODIUM 40 MG/0.4ML IJ SOSY
40.0000 mg | PREFILLED_SYRINGE | INTRAMUSCULAR | Status: DC
  Administered 2022-08-18: 40 mg via SUBCUTANEOUS
  Filled 2022-08-18: qty 0.4

## 2022-08-18 MED ORDER — ACETAMINOPHEN 325 MG PO TABS
650.0000 mg | ORAL_TABLET | Freq: Four times a day (QID) | ORAL | Status: DC | PRN
  Administered 2022-08-18: 650 mg via ORAL
  Filled 2022-08-18: qty 2

## 2022-08-18 MED ORDER — FLUTICASONE FUROATE-VILANTEROL 100-25 MCG/ACT IN AEPB
1.0000 | INHALATION_SPRAY | Freq: Every day | RESPIRATORY_TRACT | Status: DC
  Administered 2022-08-18: 1 via RESPIRATORY_TRACT
  Filled 2022-08-18: qty 28

## 2022-08-18 MED ORDER — SODIUM CHLORIDE 0.9 % IV SOLN: 250.0000 mL | INTRAVENOUS | Status: DC | PRN

## 2022-08-18 MED ORDER — NICOTINE 14 MG/24HR TD PT24
14.0000 mg | MEDICATED_PATCH | Freq: Every day | TRANSDERMAL | Status: DC
  Administered 2022-08-18: 14 mg via TRANSDERMAL
  Filled 2022-08-18: qty 1

## 2022-08-18 MED ORDER — PREDNISONE 20 MG PO TABS: 40.0000 mg | ORAL_TABLET | Freq: Every day | ORAL | Status: DC

## 2022-08-18 MED ORDER — METHYLPREDNISOLONE SODIUM SUCC 125 MG IJ SOLR
125.0000 mg | Freq: Once | INTRAMUSCULAR | Status: AC
  Administered 2022-08-18: 125 mg via INTRAVENOUS
  Filled 2022-08-18: qty 2

## 2022-08-18 NOTE — Progress Notes (Signed)
Holding patient npo until swallowing study this afternoon.

## 2022-08-18 NOTE — Progress Notes (Signed)
Modified Barium Swallow Progress Note  Patient Details  Name: Jennifer Hayes MRN: [id number] Date of Birth: June 26, 1941  Today's Date: 08/18/2022  Modified Barium Swallow completed.  Full report located under Chart Review in the Imaging Section.  Brief recommendations include the following:  Clinical Impression  Upon entering the fluro suite, pt voiced "I already you told you nothing was wrong with my swallowing and I am not going to drink any of that stuff" (referring to barium on counter). Education provided on concern and current study. Pt became willing to consume 3 sips of thin liquids from cup prior to refusing further trials. When consuming minimal sips of thin liquids via cup, pt presented with adequate oropharyngeal abilities. Information provided to pt's treatment team.   Swallow Evaluation Recommendations       SLP Diet Recommendations: Regular solids;Thin liquid   Liquid Administration via: Cup;Straw   Medication Administration: Whole meds with liquid   Supervision: Patient able to self feed   Compensations: Minimize environmental distractions;Slow rate;Small sips/bites   Postural Changes: Seated upright at 90 degrees   Oral Care Recommendations: Oral care BID       Verona Hartshorn B. Dreama Saa, M.S., CCC-SLP, CBIS Speech-Language Pathologist Certified Brain Injury Specialist [facility]  [facility] [phone] Ascom [phone] Fax [phone]  [name] Dreama Saa 08/18/2022,3:23 PM

## 2022-08-18 NOTE — Assessment & Plan Note (Signed)
Patient presents for evaluation of worsening shortness of breath from her baseline associated with a cough productive of clear phlegm and chills CT angiogram of the chest shows patchy areas of ground-glass attenuation throughout the right upper lobe compatible with pneumonia.  Concern for possible aspiration pneumonia as this patient was recently treated for community-acquired pneumonia Continue Levaquin 750 mg daily Speech therapy consult for swallow function evaluation

## 2022-08-18 NOTE — Assessment & Plan Note (Signed)
Continue trazodone 

## 2022-08-18 NOTE — Assessment & Plan Note (Signed)
Secondary to acute COPD exacerbation and pneumonia At baseline patient wears 3 L of oxygen and presented to the ER in respiratory distress with tachypnea and required 6 L of oxygen to maintain pulse oximetry greater than 92% We will attempt to wean oxygen down as tolerated once patient's acute illness improves or resolves

## 2022-08-18 NOTE — ED Provider Notes (Signed)
[facility] Provider Note    Event Date/Time   First MD Initiated Contact with Patient 08/18/22 [phone]     (approximate)   History   Shortness of Breath (Patient C/O SOB that began yesterday. Patient was treated for pneumonia last week, completed antibiotics. )   HPI  Jennifer Hayes is a 81 y.o. female brought to the EMS from home with a chief complaint of shortness of breath.  Patient with a history of COPD, CHF on baseline 3 L nasal cannula oxygen.  States she was treated for pneumonia with antibiotics and steroids 1 week ago.  Felt better than worse starting yesterday with increased shortness of breath, productive cough and associated chest tightness.  Increase her oxygen to 6 L nasal cannula.  Arrives to the ED finishing nebulizer treatments.  Denies fever, abdominal pain, nausea/vomiting or dizziness     Past Medical History   Past Medical History:  Diagnosis Date   Arthritis    CHF (congestive heart failure) (HCC)    COPD (chronic obstructive pulmonary disease) (HCC)    Depression    GERD (gastroesophageal reflux disease)    Stroke [facility])      Active Problem List   Patient Active Problem List   Diagnosis Date Noted   Overweight (BMI 25.0-29.9) 08/06/2022   Sepsis (HCC) 08/05/2022   Lobar pneumonia (HCC) 08/05/2022   Diarrhea 08/05/2022   Weakness 08/05/2022   Tobacco abuse counseling 08/03/2022   Frequent bowel movements 07/14/2022   Breast pain, left 05/19/2022   Wheezing 05/19/2022   Chronic right shoulder pain 11/06/2021   COPD with acute exacerbation (HCC) 06/21/2021   Hyperlipidemia 06/21/2021   Acute respiratory failure with hypoxia (HCC) 06/21/2021   Chronic diastolic CHF (congestive heart failure) (HCC) 06/21/2021   Tobacco abuse 06/21/2021   Depression, recurrent (HCC) 03/20/2021   Memory loss of unknown cause 07/15/2020   MDD (major depressive disorder) 06/02/2019   Cigarette nicotine dependence without  complication 04/07/2019   Major depressive disorder in full remission (HCC) 04/07/2019   HTN (hypertension) 12/26/2018   UTI (urinary tract infection) 11/04/2018   Chronic bilateral low back pain 05/09/2018   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 05/09/2018   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/31/2017   Other hyperlipidemia 10/17/2017   Chronic pain syndrome 09/03/2017   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   Lumbar post-laminectomy syndrome 09/03/2017   Chronic pruritus 01/04/2017   Vitamin B12 deficiency 01/01/2017   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 Authorizing Provider  ciprofloxacin (CIPRO) 250 MG tablet Take 1 tablet (250 mg total) by mouth 2 (two) times daily for 7 days. 08/11/22 08/18/22  Corky Downs, MD  albuterol (VENTOLIN HFA) 108 (90 Base) MCG/ACT inhaler Inhale 2 puffs into the lungs every 6 (six) hours as needed for wheezing or shortness of breath. 08/07/22   Alford Highland, MD  celecoxib (CELEBREX) 200 MG capsule Take 1 capsule (200 mg total) by mouth daily at 2  PM. 07/04/22   Cletis Athens, MD  cyanocobalamin (VITAMIN B12) 1000 MCG tablet Take 1,000 mcg by mouth daily.    [provider]  esomeprazole (NEXIUM) 40 MG capsule Take 40 mg by mouth daily.    [provider]  Fluticasone-Umeclidin-Vilant (TRELEGY ELLIPTA) 100-62.5-25 MCG/ACT AEPB Inhale 1 puff into the lungs daily. 08/07/22   Loletha Grayer,  MD  nicotine (NICODERM CQ - DOSED IN MG/24 HOURS) 21 mg/24hr patch One 21mg  patch chest wall daily (okay to substitute generic) 08/08/22   Loletha Grayer, MD  predniSONE (DELTASONE) 20 MG tablet Two tabs po daily for three days 08/08/22   Loletha Grayer, MD  traZODone (DESYREL) 50 MG tablet Take 0.5-1 tablets (25-50 mg total) by mouth at bedtime as needed for sleep. 05/19/22   Theresia Lo, NP     Allergies  Bupropion and Penicillins   Family History   Family History  Problem Relation Age of Onset   Dementia Mother    Diabetes Mother      Physical Exam  Triage Vital Signs: ED Triage Vitals  Enc Vitals Group     BP      Pulse      Resp      Temp      Temp src      SpO2      Weight      Height      Head Circumference      Peak Flow      Pain Score      Pain Loc      Pain Edu?      Excl. in [place]?     Updated Vital Signs: BP 110/68   Pulse 79   Temp 98.3 F (36.8 C) (Oral)   Resp 16   Wt 77.8 kg   SpO2 96%   BMI 30.40 kg/m    General: Awake, moderate distress.  CV:  RRR. Good peripheral perfusion.  Resp:  Increased effort.  Loose, rattly cough noted.  Rales and rhonchi. Abd:  Nonender.  No distention.  Other:  No petechiae.   ED Results / Procedures / Treatments  Labs (all labs ordered are listed, but only abnormal results are displayed) Labs Reviewed  COMPREHENSIVE METABOLIC PANEL - Abnormal; Notable for the following components:      Result Value   Glucose, Bld 114 (*)    Calcium 8.6 (*)    Albumin 3.4 (*)    All other components within normal limits  RESP PANEL BY RT-PCR (FLU A&B, COVID) ARPGX2  CULTURE, BLOOD (ROUTINE X 2)  CULTURE, BLOOD (ROUTINE X 2)  LACTIC ACID, PLASMA  CBC WITH DIFFERENTIAL/PLATELET  BRAIN NATRIURETIC PEPTIDE  PROCALCITONIN  TROPONIN I (HIGH SENSITIVITY)     EKG  ED ECG REPORT I, Alston Berrie J, the attending physician, personally viewed and interpreted this ECG.   Date: 08/18/2022  EKG Time: 0540  Rate:  83  Rhythm: normal sinus rhythm  Axis: Normal  Intervals:none  ST&T Change: Nonspecific    RADIOLOGY I have independently visualized and interpreted patient's chest x-ray as well as noted the radiology interpretation:  X-ray: Persistent right upper lobe density, recommend CT chest with contrast  CTA chest: Pending  Official radiology report(s): DG Chest Port 1 View  Result Date: 08/18/2022 CLINICAL DATA:  Shortness of breath. EXAM: PORTABLE CHEST 1 VIEW COMPARISON:  Two-view chest x-ray 08/05/2022. FINDINGS: Heart size is normal. Atherosclerotic calcifications are present at the aortic arch. Previously seen right upper lobe airspace disease has cleared.  Persistent focal density is present in the right upper lobe. Coarse interstitial markings remain bilaterally. Advanced degenerative changes are present in both shoulders. IMPRESSION: 1. Interval clearing of right upper lobe airspace disease. 2. Persistent focal density in the right upper lobe. While this may represent residual airspace disease. Underlying nodule is not excluded. Recommend continued chest radiographs to clearing or CT of the chest with contrast for further evaluation. Electronically Signed   By: San Morelle M.D.   On: 08/18/2022 05:59     PROCEDURES:  Critical Care performed: Yes, see critical care procedure note(s)  CRITICAL CARE Performed by: Paulette Blanch   Total critical care time: 45 minutes  Critical care time was exclusive of separately billable procedures and treating other patients.  Critical care was necessary to treat or prevent imminent or life-threatening deterioration.  Critical care was time spent personally by me on the following activities: development of treatment plan with patient and/or surrogate as well as nursing, discussions with consultants, evaluation of patient's response to treatment, examination of patient, obtaining history from patient or surrogate, ordering and performing  treatments and interventions, ordering and review of laboratory studies, ordering and review of radiographic studies, pulse oximetry and re-evaluation of patient's condition.   Marland Kitchen1-3 Lead EKG Interpretation  Performed by: Paulette Blanch, MD Authorized by: Paulette Blanch, MD     Interpretation: normal     ECG rate:  90   ECG rate assessment: normal     Rhythm: sinus rhythm     Ectopy: none     Conduction: normal   Comments:     Patient placed on cardiac monitor to evaluate for arrhythmias    MEDICATIONS ORDERED IN ED: Medications  ipratropium-albuterol (DUONEB) 0.5-2.5 (3) MG/3ML nebulizer solution 3 mL (has no administration in time range)  sodium chloride 0.9 % bolus 1,000 mL (1,000 mLs Intravenous New Bag/Given 08/18/22 0554)  methylPREDNISolone sodium succinate (SOLU-MEDROL) 125 mg/2 mL injection 125 mg (125 mg Intravenous Given 08/18/22 0554)  iohexol (OMNIPAQUE) 350 MG/ML injection 75 mL (75 mLs Intravenous Contrast Given 08/18/22 0644)     IMPRESSION / MDM / ASSESSMENT AND PLAN / ED COURSE  I reviewed the triage vital signs and the nursing notes.                             81 year old female presenting with cough and shortness of breath. Differential includes, but is not limited to, viral syndrome, bronchitis including COPD exacerbation, pneumonia, reactive airway disease including asthma, CHF including exacerbation with or without pulmonary/interstitial edema, pneumothorax, ACS, thoracic trauma, and pulmonary embolism.  I have personally reviewed patient's records and note her recent hospitalization 10/28-10/30/2023 for community-acquired pneumonia.  Patient's presentation is most consistent with acute presentation with potential threat to life or bodily function.  The patient is on the cardiac monitor to evaluate for evidence of arrhythmia and/or significant heart rate changes.  We will obtain sepsis protocol lab work and x-ray, administer 125 mg IV Solu-Medrol, initiate IV  fluid resuscitation.  Anticipate hospitalization.  Clinical Course as of 08/18/22 [id number]  Ludwig Clarks Aug 18, 2022  0604 X-ray demonstrates persistent right upper lobe density, recommend CT chest with contrast. [JS]  [phone] Updated patient on laboratory results thus far: WBC 8.5, electrolytes unremarkable, first negative troponin.  Audible rales and rhonchi noted although patient appears more comfortable after nebulizer treatment. [JS]  [id number] Respiratory panel was negative.  Lactic acid 1 [JS]  0658 Care transferred to  Dr. Corky Downs at change of shift pending CT chest and admission. [JS]    Clinical Course User Index [JS] Paulette Blanch, MD     FINAL CLINICAL IMPRESSION(S) / ED DIAGNOSES   Final diagnoses:  Shortness of breath  Hypoxia  COPD exacerbation ([place])     Rx / DC Orders   ED Discharge Orders     None        Note:  This document was prepared using Dragon voice recognition software and may include unintentional dictation errors.   Paulette Blanch, MD 08/18/22 0700

## 2022-08-18 NOTE — Evaluation (Signed)
Clinical/Bedside Swallow Evaluation Patient Details  Name: Jennifer Hayes MRN: [id number] Date of Birth: 02-24-1941  Today's Date: 08/18/2022 Time: SLP Start Time (ACUTE ONLY): 0930 SLP Stop Time (ACUTE ONLY): 0943 SLP Time Calculation (min) (ACUTE ONLY): 13 min  Past Medical History:  Past Medical History:  Diagnosis Date   Arthritis    CHF (congestive heart failure) (HCC)    COPD (chronic obstructive pulmonary disease) (HCC)    Depression    GERD (gastroesophageal reflux disease)    Stroke [facility])    Past Surgical History:  Past Surgical History:  Procedure Laterality Date   APPENDECTOMY     BACK SURGERY     HPI:  Jennifer Hayes is a 81 y.o. 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  Patient states she had completed her course of antibiotic therapy and felt better but 1 day prior to her hospitalization she developed a cough productive of clear phlegm, chest tightness and worsening shortness of breath from her baseline with any form of exertion. She had a choking episode 1 day prior to her admission while eating a sandwich but states that it is unusual for her. CT angiogram of the chest showed no pulmonary embolus. Patchy areas of ground-glass attenuation throughout the right upper lobe compatible with pneumonia. No underlying nodule or mass lesion.    Assessment / Plan / Recommendation  Clinical Impression  At bedside, pt presents with s/s concerning for potential aspiration of thin liquids via straw. These symptoms included increased audible wet respirations after each swallow of thin liquids as well as increased RR to 25 with subjectively increased WOB. Additionally, pt does report occasional cough with PO consumption. Given pt's recent  history of pneumonia and current admission, recommend an instrumental swallow study to aid in differential diagnosis and potential risk of rehospitalization. Education provided to pt and she was agreeable to this POC. SLP Visit Diagnosis: Dysphagia, unspecified (R13.10)    Aspiration Risk  Moderate aspiration risk    Diet Recommendation  (defer until completion of instrumental study)   Liquid Administration via: Straw Medication Administration: Whole meds with liquid Compensations: Minimize environmental distractions;Slow rate;Small sips/bites Postural Changes: Seated upright at 90 degrees    Other  Recommendations Oral Care Recommendations: Oral care BID    Recommendations for follow up therapy are one component of a multi-disciplinary discharge planning process, led by the attending physician.  Recommendations may be updated based on patient status, additional functional criteria and insurance authorization.  Follow up Recommendations Follow physician's recommendations for discharge plan and follow up therapies         Functional Status Assessment Patient has not had a recent decline in their functional status          Swallow Study   General Date of Onset: 08/18/22 HPI: Jennifer Hayes is a 81 y.o. 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  Patient states she had completed her course of antibiotic therapy and felt better but 1 day prior to her hospitalization she developed a cough productive of clear phlegm, chest tightness and worsening shortness of breath from her baseline with any form  of exertion. She had a choking episode 1 day prior to her admission while eating a sandwich but states that it is unusual for her. CT angiogram of the chest showed no  pulmonary embolus. Patchy areas of ground-glass attenuation throughout the right upper lobe compatible with pneumonia. No underlying nodule or mass lesion. Type of Study: Bedside Swallow Evaluation Previous Swallow Assessment: none in chart Diet Prior to this Study: Regular;Thin liquids Temperature Spikes Noted: No Respiratory Status: Nasal cannula History of Recent Intubation: No Behavior/Cognition: Alert;Cooperative;Pleasant mood Oral Cavity Assessment: Within Functional Limits Oral Care Completed by SLP: No Oral Cavity - Dentition: Dentures, top;Missing dentition Vision: Functional for self-feeding Self-Feeding Abilities: Able to feed self Patient Positioning: Upright in bed Baseline Vocal Quality: Hoarse Volitional Cough: Strong Volitional Swallow: Able to elicit    Oral/Motor/Sensory Function Overall Oral Motor/Sensory Function: Within functional limits   Ice Chips Ice chips: Not tested   Thin Liquid Thin Liquid: Impaired Presentation: Straw;Self Fed Pharyngeal  Phase Impairments: Suspected delayed Swallow;Change in Vital Signs    Nectar Thick Nectar Thick Liquid: Not tested   Honey Thick Honey Thick Liquid: Not tested   Puree Puree: Within functional limits Presentation: Self Fed   Solid     Solid: Not tested     Chabeli Barsamian B. Dreama Saa, M.S., CCC-SLP, Tree surgeon Certified Brain Injury Specialist [facility]  [facility] Office [phone] Ascom [phone] Fax [phone]

## 2022-08-18 NOTE — Progress Notes (Signed)
       CROSS COVER NOTE  NAME: Jennifer Hayes MRN: [id number] DOB : 1940-11-08    Time of Service   0932  HPI/Events of Note   Notified by charge nurse that patient wanted to leave. Patient signed AMA document an left prior to me seeing patient on unit at this time  Assessment and  Interventions   Assessment:  Plan: X X X      Donnie Mesa NP [facility]

## 2022-08-18 NOTE — Plan of Care (Signed)
Pt adamantly insisting on leaving AMA.  Notified Manuela Schwartz, NP and [facility].  Requested NT3 to remove IVs.  She walked off the unit under her own steam.  Said her friend was picking her up at the front door.  Pt signed AMA paperwork.

## 2022-08-18 NOTE — Progress Notes (Signed)
Called to patient's room , patient requesting to be back on O2 , O2 reapplied at 2 lpm per [place]. Telemetry monitor on floor , states I do not want that thing on. Charge nurse notified,

## 2022-08-18 NOTE — Progress Notes (Signed)
Pharmacy Antibiotic Note  Jennifer Hayes is a 81 y.o. female admitted on 08/18/2022 with pneumonia.  Pharmacy has been consulted for levofloxacin dosing.  Allergy to penicillins (hives). No previous history of cephalosporins per my chart review. CrCl > 50 (56.4 mL/min). Renal function at baseline.   Plan: Levofloxacin IV 750 mg every 24 hours  Weight: 77.8 kg (171 lb 9.6 oz)  Temp (24hrs), Avg:98.5 F (36.9 C), Min:98.3 F (36.8 C), Max:98.6 F (37 C)  Recent Labs  Lab 08/18/22 0552  WBC 8.5  CREATININE 0.60  LATICACIDVEN 1.0    Estimated Creatinine Clearance: 56.4 mL/min (by C-G formula based on SCr of 0.6 mg/dL).    Allergies  Allergen Reactions   Bupropion Swelling    headache   Penicillins Hives    Antimicrobials this admission: levofloxacin 11/10 >>   Dose adjustments this admission: N/a  Microbiology results: 11/10 BCx: in process 11/10 MRSA PCR: ordered  Thank you for allowing pharmacy to be a part of this patient's care.  Jaynie Bream 08/18/2022 9:31 AM

## 2022-08-18 NOTE — ED Provider Notes (Signed)
CT scan does demonstrate continued pneumonia in the right lung, will start Levaquin again, she was on this last hospitalization.  We will admit to the hospitalist   Jene Every, MD 08/18/22 [phone]

## 2022-08-18 NOTE — Assessment & Plan Note (Addendum)
Smoking cessation has been discussed with patient Place patient on a nicotine transdermal patch 14 mg daily

## 2022-08-18 NOTE — Assessment & Plan Note (Signed)
Stable and not acutely exacerbated ?

## 2022-08-18 NOTE — Progress Notes (Signed)
Patient returned from X-ray , speech cleared patient for diet. Cracker s and coke brought to patient.

## 2022-08-18 NOTE — Assessment & Plan Note (Signed)
Continue omeprazole 

## 2022-08-18 NOTE — Assessment & Plan Note (Signed)
Patient has a history of COPD with chronic respiratory failure on 3 L of oxygen continuous and presents for evaluation of worsening shortness of breath from her baseline associated with a cough productive of clear phlegm. She also had diffuse wheezes on exam Place patient on systemic and inhaled steroids Place patient on scheduled and as needed bronchodilator therapy Continue oxygen supplementation to maintain pulse oximetry greater than 92%.

## 2022-08-18 NOTE — H&P (Addendum)
History and Physical    Patient: Jennifer Hayes FWY:[id number] DOB: [birth year] DOA: 08/18/2022 DOS: the patient was seen and examined on 08/18/2022 PCP: Corky Downs, MD  Patient coming from: Home  Chief Complaint:  Chief Complaint  Patient presents with   Shortness of Breath    Patient C/O SOB that began yesterday. Patient was treated for pneumonia last week, completed antibiotics.    HPI: Jennifer Hayes is a 81 y.o. 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  Patient states she had completed her course of antibiotic therapy and felt better but 1 day prior to her hospitalization she developed a cough productive of clear phlegm, chest tightness and worsening shortness of breath from her baseline with any form of exertion.  She also complains of chills but denies having any fever.  She denies having any sick contacts.  She had to increase her home oxygen from 3 to 6 L due to her worsening shortness of breath. She had a choking episode 1 day prior to her admission while eating a sandwich but states that it is unusual for her.  She denies having any nausea, no vomiting, no headache, no dizziness, no lightheadedness, no abdominal pain, no changes in her bowel habits, no urinary symptoms, no leg swelling, no orthopnea, no blurred vision, no focal deficit. Her respiratory viral panel is negative CT angiogram of the chest showed no pulmonary embolus. Patchy areas of ground-glass attenuation throughout the right upper lobe compatible with pneumonia. No underlying nodule or mass lesion. No further imaging follow-up recommended. Coronary artery disease. Small hiatal hernia. Remote compression fractures at T8, T10 and T11. Aortic Atherosclerosis . She received  Solu-Medrol 125 mg x 1 dose and Levaquin 750 mg x 1 dose She has been weaned down from 6 L to her baseline of 3 L with pulse oximetry of 98%. She will be admitted to the hospital for further evaluation.   Review of Systems: As mentioned in the history of present illness. All other systems reviewed and are negative. Past Medical History:  Diagnosis Date   Arthritis    CHF (congestive heart failure) (HCC)    COPD (chronic obstructive pulmonary disease) (HCC)    Depression    GERD (gastroesophageal reflux disease)    Stroke [facility])    Past Surgical History:  Procedure Laterality Date   APPENDECTOMY     BACK SURGERY     Social History:  reports that she has been smoking cigarettes. She has been smoking an average of .5 packs per day. She has never used smokeless tobacco. She reports that she does not currently use alcohol. She reports that she does not use drugs.  Allergies  Allergen Reactions   Bupropion Swelling    headache   Penicillins Hives    Family History  Problem Relation Age of Onset   Dementia Mother    Diabetes Mother     Prior to Admission medications   Medication Sig Start Date End Date Taking? Authorizing Provider  ciprofloxacin (CIPRO) 250 MG tablet Take 1 tablet (250 mg total) by mouth 2 (two) times daily for 7 days. 08/11/22 08/18/22  Corky Downs, MD  albuterol (VENTOLIN HFA) 108 (90 Base) MCG/ACT inhaler Inhale 2 puffs into the lungs every 6 (six) hours as needed for wheezing or shortness of breath. 08/07/22  Alford Highland, MD  celecoxib (CELEBREX) 200 MG capsule Take 1 capsule (200 mg total) by mouth daily at 2 PM. 07/04/22   Corky Downs, MD  cyanocobalamin (VITAMIN B12) 1000 MCG tablet Take 1,000 mcg by mouth daily.    [provider]  esomeprazole (NEXIUM) 40 MG capsule Take 40 mg by mouth daily.    [provider]  Fluticasone-Umeclidin-Vilant (TRELEGY ELLIPTA) 100-62.5-25 MCG/ACT AEPB Inhale 1 puff into the lungs daily. 08/07/22    Alford Highland, MD  nicotine (NICODERM CQ - DOSED IN MG/24 HOURS) 21 mg/24hr patch One 21mg  patch chest wall daily (okay to substitute generic) 08/08/22   08/10/22, MD  predniSONE (DELTASONE) 20 MG tablet Two tabs po daily for three days 08/08/22   08/10/22, MD  traZODone (DESYREL) 50 MG tablet Take 0.5-1 tablets (25-50 mg total) by mouth at bedtime as needed for sleep. 05/19/22   07/19/22, NP    Physical Exam: Vitals:   08/18/22 0542 08/18/22 0630 08/18/22 0722 08/18/22 0909  BP:  110/68 (!) 107/53 109/81  Pulse:  79 85 85  Resp:  16 (!) 22 (!) 22  Temp:    98.6 F (37 C)  TempSrc:    Oral  SpO2:  96% 98% 94%  Weight: 77.8 kg      Physical Exam Vitals and nursing note reviewed.  Constitutional:      Appearance: She is well-developed.  HENT:     Head: Normocephalic and atraumatic.  Eyes:     Pupils: Pupils are equal, round, and reactive to light.  Cardiovascular:     Rate and Rhythm: Normal rate and regular rhythm.  Pulmonary:     Effort: Tachypnea present.     Breath sounds: Examination of the right-upper field reveals wheezing and rhonchi. Examination of the left-upper field reveals wheezing. Examination of the right-middle field reveals wheezing and rhonchi. Examination of the left-middle field reveals wheezing. Examination of the right-lower field reveals wheezing and rhonchi. Examination of the left-lower field reveals wheezing. Wheezing and rhonchi present.     Comments: Coarse breath sounds in all lung fields Abdominal:     General: Bowel sounds are normal.     Palpations: Abdomen is soft.  Musculoskeletal:        General: Normal range of motion.     Cervical back: Normal range of motion and neck supple.  Skin:    General: Skin is warm and dry.  Neurological:     General: No focal deficit present.     Mental Status: She is alert.  Psychiatric:        Mood and Affect: Mood normal.        Behavior: Behavior normal.     Data  Reviewed: Relevant notes from primary care and specialist visits, past discharge summaries as available in EHR, including Care Everywhere. Prior diagnostic testing as pertinent to current admission diagnoses Updated medications and problem lists for reconciliation ED course, including vitals, labs, imaging, treatment and response to treatment Triage notes, nursing and pharmacy notes and ED provider's notes Notable results as noted in HPI Labs reviewed.  Sodium 141, potassium 4.2, chloride 107, bicarb 29, glucose 114, BUN 10, creatinine 0.60, calcium 8.6, total protein 6.8, albumin 3.4, AST 17, ALT 13, alkaline phosphatase 81, total bilirubin 0.6, BNP 16, lactic acid 1.0, calcitonin less than 0.10, white count 8.5, hemoglobin 13, hematocrit 39.3, platelet count 258 Chest x-ray reviewed by me shows interval clearing of right upper lobe airspace disease. Persistent focal density in  the right upper lobe. While this may represent residual airspace disease. Underlying nodule is not excluded.  Twelve-lead EKG reviewed by me shows sinus rhythm with low voltage. There are no new results to review at this time.  Assessment and Plan: * Acute on chronic respiratory failure (HCC) Secondary to acute COPD exacerbation and pneumonia At baseline patient wears 3 L of oxygen and presented to the ER in respiratory distress with tachypnea and required 6 L of oxygen to maintain pulse oximetry greater than 92% We will attempt to wean oxygen down as tolerated once patient's acute illness improves or resolves  Pneumonia Patient presents for evaluation of worsening shortness of breath from her baseline associated with a cough productive of clear phlegm and chills CT angiogram of the chest shows patchy areas of ground-glass attenuation throughout the right upper lobe compatible with pneumonia.  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 She also had diffuse wheezes on exam Place patient on systemic and inhaled steroids Place patient on scheduled and as needed bronchodilator therapy Continue oxygen supplementation to maintain pulse oximetry greater than 92%.  Chronic diastolic CHF (congestive heart failure) (HCC) Stable and not acutely exacerbated  GERD (gastroesophageal reflux disease) Continue omeprazole  Tobacco abuse Smoking cessation has been discussed with patient Place patient on a nicotine transdermal patch 14 mg daily  MDD (major depressive disorder) Continue trazodone      Advance Care Planning:   Code Status: DNR   Consults: Speech therapy  Family Communication: Greater than 50% of time was spent discussing plan of care with patient at the bedside.  She verbalizes understanding and agrees with the treatment plan.  CODE STATUS was discussed and she wishes to be a DNR.  She lists her son as her healthcare power of attorney.  Severity of Illness: The appropriate patient status for this patient is INPATIENT. Inpatient status is judged to be reasonable and necessary in order to provide the required intensity of service to ensure the patient's safety. The patient's presenting symptoms, physical exam findings, and initial radiographic and laboratory data in the context of their chronic comorbidities is felt to place them at high risk for further clinical deterioration. Furthermore, it is not anticipated that the patient will be medically stable for discharge from the hospital within 2 midnights of admission.   * I certify that at the point of admission it is my clinical judgment  that the patient will require inpatient hospital care spanning beyond 2 midnights from the point of admission due to high intensity of service, high risk for further deterioration and high frequency of surveillance required.*  Author: Lucile Shutters, MD 08/18/2022 9:49 AM  For on call review [email or web address].

## 2022-08-21 NOTE — Discharge Summary (Signed)
Physician Discharge Summary   Patient: Jennifer Hayes MRN: [id number] DOB: 06-26-1941  Admit date:     08/18/2022  Discharge date: 08/18/2022  Discharge Physician: Shreeya Recendiz   PCP: Cletis Athens, MD   Recommendations at discharge:   Patient signed out [place]  Discharge Diagnoses: Principal Problem:   Acute on chronic respiratory failure ([place]) Active Problems:   Pneumonia   COPD with acute exacerbation ([place])   Chronic diastolic CHF (congestive heart failure) (HCC)   GERD (gastroesophageal reflux disease)   Tobacco abuse   MDD (major depressive disorder)  Resolved Problems:   * No resolved hospital problems. *  Hospital Course:  Jennifer Hayes is a 81 y.o. 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  Patient states she had completed her course of antibiotic therapy and felt better but 1 day prior to her hospitalization she developed a cough productive of clear phlegm, chest tightness and worsening shortness of breath from her baseline with any form of exertion.  She also complained of chills but denied having any fever.  She denied having any sick contacts.  She had to increase her home oxygen from 3 to 6 L due to her worsening shortness of breath. She had a choking episode 1 day prior to her admission while eating a sandwich but states that it is unusual for her.  She denied having any nausea, no vomiting, no headache, no dizziness, no lightheadedness, no abdominal pain, no changes in her bowel habits, no urinary symptoms, no leg swelling, no orthopnea, no blurred vision, no focal deficit. Her respiratory viral panel is negative CT angiogram of the chest showed no pulmonary embolus. Patchy areas of ground-glass attenuation  throughout the right upper lobe compatible with pneumonia. No underlying nodule or mass lesion. No further imaging follow-up recommended. Coronary artery disease. Small hiatal hernia. Remote compression fractures at T8, T10 and T11. Aortic Atherosclerosis . She received Solu-Medrol 125 mg x 1 dose and Levaquin 750 mg x 1 dose She has been weaned down from 6 L to her baseline of 3 L with pulse oximetry of 98%. She will be admitted to the hospital for further evaluation. Assessment and Plan: * Acute on chronic respiratory failure (HCC) Secondary to acute COPD exacerbation and pneumonia At baseline patient wears 3 L of oxygen and presented to the ER in respiratory distress with tachypnea and required 6 L of oxygen to maintain pulse oximetry greater than 92% We will attempt to wean oxygen down as tolerated once patient's acute illness improves or resolves  Pneumonia Patient presents for evaluation of worsening shortness of breath from her baseline associated with a cough productive of clear phlegm and chills CT angiogram of the chest shows patchy areas of ground-glass attenuation throughout the right upper lobe compatible with pneumonia.  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 She also had diffuse wheezes on exam Place patient on systemic and inhaled steroids Place patient on scheduled and as  needed bronchodilator therapy Continue oxygen supplementation to maintain pulse oximetry greater than 92%.  Chronic diastolic CHF (congestive heart failure) (HCC) Stable and not acutely  exacerbated  GERD (gastroesophageal reflux disease) Continue omeprazole  Tobacco abuse Smoking cessation has been discussed with patient Place patient on a nicotine transdermal patch 14 mg daily  MDD (major depressive disorder) Continue trazodone         Consultants: None Procedures performed: Modified barium swallow Disposition:  Patient signed out AGAINST MEDICAL ADVICE Diet recommendation: Regular solids/thin liquids Cardiac diet DISCHARGE MEDICATION: Allergies as of 08/18/2022       Reactions   Bupropion Swelling   headache   Penicillins Rash   Rash x 30 years ago        Medication List     ASK your doctor about these medications    albuterol 108 (90 Base) MCG/ACT inhaler Commonly known as: VENTOLIN HFA Inhale 2 puffs into the lungs every 6 (six) hours as needed for wheezing or shortness of breath.   celecoxib 200 MG capsule Commonly known as: CELEBREX Take 1 capsule (200 mg total) by mouth daily at 2 PM.   ciprofloxacin 250 MG tablet Commonly known as: Cipro Take 1 tablet (250 mg total) by mouth 2 (two) times daily for 7 days. Ask about: Should I take this medication?   cyanocobalamin 1000 MCG tablet Commonly known as: VITAMIN B12 Take 1,000 mcg by mouth daily.   esomeprazole 40 MG capsule Commonly known as: NEXIUM Take 40 mg by mouth daily.   nicotine 21 mg/24hr patch Commonly known as: NICODERM CQ - dosed in mg/24 hours One 21mg  patch chest wall daily (okay to substitute generic)   predniSONE 20 MG tablet Commonly known as: DELTASONE Two tabs po daily for three days   traZODone 50 MG tablet Commonly known as: DESYREL Take 0.5-1 tablets (25-50 mg total) by mouth at bedtime as needed for sleep.   Trelegy Ellipta 100-62.5-25 MCG/ACT Aepb Generic drug: Fluticasone-Umeclidin-Vilant Inhale 1 puff into the lungs daily.        Discharge Exam: Filed Weights   08/18/22 0542 08/18/22 1040  Weight: 77.8 kg 77.8 kg   Signed out AGAINST  MEDICAL ADVICE  Condition at discharge:  Signed out [place]  The results of significant diagnostics from this hospitalization (including imaging, microbiology, ancillary and laboratory) are listed below for reference.   Imaging Studies: DG Swallowing Func-Speech Pathology  Result Date: 08/18/2022 Table formatting from the original result was not included. Images from the original result were not included. Objective Swallowing Evaluation: Type of Study: MBS-Modified Barium Swallow Study  Patient Details Name: Jennifer Hayes MRN: [id number] Date of Birth: January 10, 1941 Today's Date: 08/18/2022 Time: SLP Start Time (ACUTE ONLY): 43 -SLP Stop Time (ACUTE ONLY): 1416 SLP Time Calculation (min) (ACUTE ONLY): 8 min Past Medical History: Past Medical History: Diagnosis Date  Arthritis   CHF (congestive heart failure) (HCC)   COPD (chronic obstructive pulmonary disease) (HCC)   Depression   GERD (gastroesophageal reflux disease)   Stroke [facility])  Past Surgical History: Past Surgical History: Procedure Laterality Date  APPENDECTOMY    BACK SURGERY   HPI: Jennifer Hayes is a 81 y.o. 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  Patient states she had completed her course of antibiotic therapy and felt better  but 1 day prior to her hospitalization she developed a cough productive of clear phlegm, chest tightness and worsening shortness of breath from her baseline with any form of exertion. She had a choking episode 1 day prior to her admission while eating a sandwich but states that it is unusual for her. CT angiogram of the chest showed no pulmonary embolus. Patchy areas of ground-glass attenuation throughout the right upper lobe compatible with pneumonia. No  underlying nodule or mass lesion.  Subjective: pt upset, stating that she would not consume any of "that stuff" - expressed esire to discharge  Recommendations for follow up therapy are one component of a multi-disciplinary discharge planning process, led by the attending physician.  Recommendations may be updated based on patient status, additional functional criteria and insurance authorization. Assessment / Plan / Recommendation   08/18/2022   3:19 PM Clinical Impressions Clinical Impression Upon entering the fluro suite, pt voiced "I already you told you nothing was wrong with my swallowing and I am not going to drink any of that stuff" (referring to barium on counter). Education provided on concern and current study. Pt became willing to consume 3 sips of thin liquids from cup prior to refusing further trials. When consuming minimal sips of thin liquids via cup, pt presented with adequate oropharyngeal abilities. Information provided to pt's treatment team. SLP Visit Diagnosis Dysphagia, unspecified (R13.10) Impact on safety and function No limitations     08/18/2022   3:19 PM Treatment Recommendations Treatment Recommendations No treatment recommended at this time      No data to display      08/18/2022   3:19 PM Diet Recommendations SLP Diet Recommendations Regular solids;Thin liquid Liquid Administration via Cup;Straw Medication Administration Whole meds with liquid Compensations Minimize environmental distractions;Slow rate;Small sips/bites Postural Changes Seated upright at 90 degrees     08/18/2022   3:19 PM Other Recommendations Oral Care Recommendations Oral care BID Follow Up Recommendations Follow physician's recommendations for discharge plan and follow up therapies Functional Status Assessment Patient has not had a recent decline in their functional status    No data to display        08/18/2022   3:19 PM Oral Phase Oral Phase [facility]    08/18/2022   3:19 PM Pharyngeal Phase Pharyngeal Phase [facility]     08/18/2022   3:19 PM Cervical Esophageal Phase  Cervical Esophageal Phase [facility] Happi Overton 08/18/2022, 3:24 PM                     CT Angio Chest PE W/Cm &/Or Wo Cm  Result Date: 08/18/2022 CLINICAL DATA:  Cough and congestion for 2 days. EXAM: CT ANGIOGRAPHY CHEST WITH CONTRAST TECHNIQUE: Multidetector CT imaging of the chest was performed using the standard protocol during bolus administration of intravenous contrast. Multiplanar CT image reconstructions and MIPs were obtained to evaluate the vascular anatomy. RADIATION DOSE REDUCTION: This exam was performed according to the departmental dose-optimization program which includes automated exposure control, adjustment of the mA and/or kV according to patient size and/or use of iterative reconstruction technique. CONTRAST:  68mL OMNIPAQUE IOHEXOL 350 MG/ML SOLN COMPARISON:  One-view chest x-ray 08/18/2022 FINDINGS: Cardiovascular: Heart size is normal. Coronary artery calcifications are present. No significant pericardial effusion is present. Atherosclerotic calcifications are present at the aortic arch and great vessel origins without focal stenosis of greater than 50%. Pulmonary artery opacification is excellent. No focal filling defects are present to suggest pulmonary emboli. Pulmonary artery size is normal. Mediastinum/Nodes:  No enlarged mediastinal, hilar, or axillary lymph nodes. Thyroid gland, trachea, and esophagus demonstrate no significant findings. Lungs/Pleura: Patchy areas of ground-glass attenuation are present throughout the right upper lobe. This includes the area of concern on the chest x-ray. No nodule or mass lesion is present. Right middle lobe and lower lobe are clear. Left lung is clear. No significant pleural disease is present. Upper Abdomen: A small hiatal hernia is present. Visualized upper abdomen is otherwise unremarkable. Musculoskeletal: Remote compression fractures are present at T8, T10 and T11. No acute fractures are present.  No focal osseous lesions are present. Review of the MIP images confirms the above findings. IMPRESSION: 1. No pulmonary embolus. 2. Patchy areas of ground-glass attenuation throughout the right upper lobe compatible with pneumonia. No underlying nodule or mass lesion. No further imaging follow-up recommended. 3. Coronary artery disease. 4. Small hiatal hernia. 5. Remote compression fractures at T8, T10 and T11. 6.  Aortic Atherosclerosis (ICD10-I70.0). Electronically Signed   By: San Morelle M.D.   On: 08/18/2022 07:00   DG Chest Port 1 View  Result Date: 08/18/2022 CLINICAL DATA:  Shortness of breath. EXAM: PORTABLE CHEST 1 VIEW COMPARISON:  Two-view chest x-ray 08/05/2022. FINDINGS: Heart size is normal. Atherosclerotic calcifications are present at the aortic arch. Previously seen right upper lobe airspace disease has cleared. Persistent focal density is present in the right upper lobe. Coarse interstitial markings remain bilaterally. Advanced degenerative changes are present in both shoulders. IMPRESSION: 1. Interval clearing of right upper lobe airspace disease. 2. Persistent focal density in the right upper lobe. While this may represent residual airspace disease. Underlying nodule is not excluded. Recommend continued chest radiographs to clearing or CT of the chest with contrast for further evaluation. Electronically Signed   By: San Morelle M.D.   On: 08/18/2022 05:59   DG Shoulder Right  Result Date: 08/06/2022 CLINICAL DATA:  Pain EXAM: RIGHT SHOULDER - 2+ VIEW COMPARISON:  07/16/2019 FINDINGS: No fracture or dislocation is seen. Severe degenerative changes are noted in right shoulder with interval progression. Deformity in the lateral end of right clavicle may be residual from previous injury. Increased interstitial markings are seen in right upper lung field. IMPRESSION: No recent fracture or dislocation is seen in right shoulder. Severe degenerative changes are noted with  interval progression. Increased density in right upper lung fields may suggest pneumonia. Electronically Signed   By: Elmer Picker M.D.   On: 08/06/2022 16:55   CT Head Wo Contrast  Result Date: 08/05/2022 CLINICAL DATA:  Generalized weakness with headache and frequent falls starting yesterday. Patient hit her head with 1 of the falls. EXAM: CT HEAD WITHOUT CONTRAST CT CERVICAL SPINE WITHOUT CONTRAST TECHNIQUE: Multidetector CT imaging of the head and cervical spine was performed following the standard protocol without intravenous contrast. Multiplanar CT image reconstructions of the cervical spine were also generated. RADIATION DOSE REDUCTION: This exam was performed according to the departmental dose-optimization program which includes automated exposure control, adjustment of the mA and/or kV according to patient size and/or use of iterative reconstruction technique. COMPARISON:  Head CT 03/22/2020 FINDINGS: CT HEAD FINDINGS Brain: There is no evidence for acute hemorrhage, hydrocephalus, mass lesion, or abnormal extra-axial fluid collection. No definite CT evidence for acute infarction. Diffuse loss of parenchymal volume is consistent with atrophy. Patchy low attenuation in the deep hemispheric and periventricular white matter is nonspecific, but likely reflects chronic microvascular ischemic demyelination. Vascular: No hyperdense vessel or unexpected calcification. Skull: No evidence for fracture. No worrisome lytic  or sclerotic lesion. Sinuses/Orbits: The visualized paranasal sinuses and mastoid air cells are clear. Visualized portions of the globes and intraorbital fat are unremarkable. Other: None. CT CERVICAL SPINE FINDINGS Alignment: Trace anterolisthesis of C3 on 4 and C4 on 5 likely related to the advanced facet osteoarthropathy at those levels. Patient is fused across the C5-6 interspace with marked loss of disc height at C6-7. Skull base and vertebrae: No acute fracture. No primary bone  lesion or focal pathologic process. Soft tissues and spinal canal: No prevertebral fluid or swelling. No visible canal hematoma. Disc levels:  See above. Upper chest: 16 mm nodular focus of consolidative opacity identified right lung apex with 12 mm nodular focus in the medial left apex. Other: None IMPRESSION: 1. No acute intracranial abnormality. 2. Atrophy with chronic small vessel ischemic disease. 3. No evidence for cervical spine fracture or traumatic subluxation. 4. Degenerative changes in the cervical spine as above. 5. 16 mm nodular focus of consolidative opacity in the right lung apex with 12 mm nodular focus in the medial left apex. These may be infectious/inflammatory etiology, but neoplasm not excluded. Chest CT recommended to further evaluate. Electronically Signed   By: Kennith Center M.D.   On: 08/05/2022 16:44   CT Cervical Spine Wo Contrast  Result Date: 08/05/2022 CLINICAL DATA:  Generalized weakness with headache and frequent falls starting yesterday. Patient hit her head with 1 of the falls. EXAM: CT HEAD WITHOUT CONTRAST CT CERVICAL SPINE WITHOUT CONTRAST TECHNIQUE: Multidetector CT imaging of the head and cervical spine was performed following the standard protocol without intravenous contrast. Multiplanar CT image reconstructions of the cervical spine were also generated. RADIATION DOSE REDUCTION: This exam was performed according to the departmental dose-optimization program which includes automated exposure control, adjustment of the mA and/or kV according to patient size and/or use of iterative reconstruction technique. COMPARISON:  Head CT 03/22/2020 FINDINGS: CT HEAD FINDINGS Brain: There is no evidence for acute hemorrhage, hydrocephalus, mass lesion, or abnormal extra-axial fluid collection. No definite CT evidence for acute infarction. Diffuse loss of parenchymal volume is consistent with atrophy. Patchy low attenuation in the deep hemispheric and periventricular white matter is  nonspecific, but likely reflects chronic microvascular ischemic demyelination. Vascular: No hyperdense vessel or unexpected calcification. Skull: No evidence for fracture. No worrisome lytic or sclerotic lesion. Sinuses/Orbits: The visualized paranasal sinuses and mastoid air cells are clear. Visualized portions of the globes and intraorbital fat are unremarkable. Other: None. CT CERVICAL SPINE FINDINGS Alignment: Trace anterolisthesis of C3 on 4 and C4 on 5 likely related to the advanced facet osteoarthropathy at those levels. Patient is fused across the C5-6 interspace with marked loss of disc height at C6-7. Skull base and vertebrae: No acute fracture. No primary bone lesion or focal pathologic process. Soft tissues and spinal canal: No prevertebral fluid or swelling. No visible canal hematoma. Disc levels:  See above. Upper chest: 16 mm nodular focus of consolidative opacity identified right lung apex with 12 mm nodular focus in the medial left apex. Other: None IMPRESSION: 1. No acute intracranial abnormality. 2. Atrophy with chronic small vessel ischemic disease. 3. No evidence for cervical spine fracture or traumatic subluxation. 4. Degenerative changes in the cervical spine as above. 5. 16 mm nodular focus of consolidative opacity in the right lung apex with 12 mm nodular focus in the medial left apex. These may be infectious/inflammatory etiology, but neoplasm not excluded. Chest CT recommended to further evaluate. Electronically Signed   By: Jamison Oka.D.  On: 08/05/2022 16:44   DG Chest 2 View  Result Date: 08/05/2022 CLINICAL DATA:  Suspected sepsis. EXAM: CHEST - 2 VIEW COMPARISON:  October 04, 2021 FINDINGS: There is infiltrate in the right mid lung/perihilar region. The heart, hila, and mediastinum are otherwise unremarkable. No pneumothorax. No nodules or masses. No other abnormalities. IMPRESSION: Right mid lung/perihilar infiltrate worrisome for pneumonia. Recommend short-term follow-up  imaging to ensure resolution. Electronically Signed   By: Dorise Bullion III M.D.   On: 08/05/2022 14:43    Microbiology: Results for orders placed or performed during the hospital encounter of 08/18/22  Culture, blood (routine x 2)     Status: None (Preliminary result)   Collection Time: 08/18/22  5:52 AM   Specimen: BLOOD  Result Value Ref Range Status   Specimen Description BLOOD RIGHT HAND  Final   Special Requests   Final    BOTTLES DRAWN AEROBIC AND ANAEROBIC Blood Culture results may not be optimal due to an inadequate volume of blood received in culture bottles   Culture   Final    NO GROWTH 3 DAYS Performed at [facility], [address] [place], [place] [address]    Report Status PENDING  Incomplete  Culture, blood (routine x 2)     Status: None (Preliminary result)   Collection Time: 08/18/22  5:52 AM   Specimen: BLOOD  Result Value Ref Range Status   Specimen Description BLOOD BLOOD RIGHT ARM  Final   Special Requests   Final    BOTTLES DRAWN AEROBIC AND ANAEROBIC Blood Culture results may not be optimal due to an inadequate volume of blood received in culture bottles   Culture   Final    NO GROWTH 3 DAYS Performed at [facility], [address] [place], [place] [address]    Report Status PENDING  Incomplete  Resp Panel by RT-PCR (Flu A&B, Covid) Anterior Nasal Swab     Status: None   Collection Time: 08/18/22  5:52 AM   Specimen: Anterior Nasal Swab  Result Value Ref Range Status   SARS Coronavirus 2 by RT PCR NEGATIVE NEGATIVE Final    Comment: (NOTE) SARS-CoV-2 target nucleic acids are NOT DETECTED.  The SARS-CoV-2 RNA is generally detectable in upper respiratory specimens during the acute phase of infection. The lowest concentration of SARS-CoV-2 viral copies this assay can detect is 138 copies/mL. A negative result does not preclude SARS-Cov-2 infection and should not be used as the sole basis for treatment or other patient management  decisions. A negative result may occur with  improper specimen collection/handling, submission of specimen other than nasopharyngeal swab, presence of viral mutation(s) within the areas targeted by this assay, and inadequate number of viral copies(<138 copies/mL). A negative result must be combined with clinical observations, patient history, and epidemiological information. The expected result is Negative.  Fact Sheet for Patients:  EntrepreneurPulse.com.au  Fact Sheet for Healthcare Providers:  IncredibleEmployment.be  This test is no t yet approved or cleared by the [place] FDA and  has been authorized for detection and/or diagnosis of SARS-CoV-2 by FDA under an Emergency Use Authorization (EUA). This EUA will remain  in effect (meaning this test can be used) for the duration of the COVID-19 declaration under Section 564(b)(1) of the Act, 21 U.S.C.section 360bbb-3(b)(1), unless the authorization is terminated  or revoked sooner.       Influenza A by PCR NEGATIVE NEGATIVE Final   Influenza B by PCR NEGATIVE NEGATIVE Final    Comment: (  NOTE) The Xpert Xpress SARS-CoV-2/FLU/RSV plus assay is intended as an aid in the diagnosis of influenza from Nasopharyngeal swab specimens and should not be used as a sole basis for treatment. Nasal washings and aspirates are unacceptable for Xpert Xpress SARS-CoV-2/FLU/RSV testing.  Fact Sheet for Patients: BloggerCourse.com  Fact Sheet for Healthcare Providers: SeriousBroker.it  This test is not yet approved or cleared by the [place] FDA and has been authorized for detection and/or diagnosis of SARS-CoV-2 by FDA under an Emergency Use Authorization (EUA). This EUA will remain in effect (meaning this test can be used) for the duration of the COVID-19 declaration under Section 564(b)(1) of the Act, 21 U.S.C. section 360bbb-3(b)(1), unless the  authorization is terminated or revoked.  Performed at [facility], [address] [place], [place] [address]     Labs: CBC: Recent Labs  Lab 08/18/22 0552  WBC 8.5  NEUTROABS 5.8  HGB 13.0  HCT 39.3  MCV 89.9  PLT 258   Basic Metabolic Panel: Recent Labs  Lab 08/18/22 0552  NA 141  K 4.2  CL 107  CO2 29  GLUCOSE 114*  BUN 10  CREATININE 0.60  CALCIUM 8.6*   Liver Function Tests: Recent Labs  Lab 08/18/22 0552  AST 17  ALT 13  ALKPHOS 81  BILITOT 0.6  PROT 6.8  ALBUMIN 3.4*   CBG: No results for input(s): "GLUCAP" in the last 168 hours.  Discharge time spent: less than 30 minutes.  Signed: Lucile Shutters, MD [facility] 08/21/2022

## 2022-08-22 ENCOUNTER — Encounter: Payer: Self-pay | Admitting: Student in an Organized Health Care Education/Training Program

## 2022-08-22 ENCOUNTER — Ambulatory Visit (INDEPENDENT_AMBULATORY_CARE_PROVIDER_SITE_OTHER): Payer: Medicare Other | Admitting: Student in an Organized Health Care Education/Training Program

## 2022-08-22 VITALS — BP 122/74 | HR 83 | Temp 97.8°F | Ht 62.0 in | Wt 172.2 lb

## 2022-08-22 DIAGNOSIS — J449 Chronic obstructive pulmonary disease, unspecified: Secondary | ICD-10-CM | POA: Diagnosis not present

## 2022-08-22 DIAGNOSIS — F17219 Nicotine dependence, cigarettes, with unspecified nicotine-induced disorders: Secondary | ICD-10-CM

## 2022-08-22 MED ORDER — SPIRIVA RESPIMAT 2.5 MCG/ACT IN AERS: 2.0000 | INHALATION_SPRAY | Freq: Every day | RESPIRATORY_TRACT | 11 refills | Status: DC

## 2022-08-22 MED ORDER — NICOTINE 7 MG/24HR TD PT24: 7.0000 mg | MEDICATED_PATCH | TRANSDERMAL | 0 refills | Status: AC

## 2022-08-22 MED ORDER — NICOTINE POLACRILEX 2 MG MT LOZG: 2.0000 mg | LOZENGE | OROMUCOSAL | 3 refills | Status: AC | PRN

## 2022-08-22 MED ORDER — NICOTINE 14 MG/24HR TD PT24: 14.0000 mg | MEDICATED_PATCH | TRANSDERMAL | 0 refills | Status: AC

## 2022-08-22 MED ORDER — NICOTINE 21 MG/24HR TD PT24: 21.0000 mg | MEDICATED_PATCH | TRANSDERMAL | 0 refills | Status: AC

## 2022-08-22 MED ORDER — FLUTICASONE FUROATE-VILANTEROL 100-25 MCG/ACT IN AEPB: 1.0000 | INHALATION_SPRAY | Freq: Every day | RESPIRATORY_TRACT | 11 refills | Status: DC

## 2022-08-22 NOTE — Progress Notes (Signed)
Synopsis: Referred in for shortness of breath by Kara Dies, NP  Assessment & Plan:   1. Chronic obstructive pulmonary disease, unspecified COPD type (HCC)  Patient has a longstanding history of smoking with recurrent symptoms consistent with COPD/bronchitis.  Unfortunately, she continues to smoke cigarettes which is contributing to her recurrent exacerbations.  I reviewed her previous labs and she has not had pulmonary function testing and does not have any eosinophilia on blood work.  CT scan of the chest does not show any masses or emphysema.  She had been on oxygen and is requesting a concentrator for which we will perform trending pulse oximetry to see if she qualifies for oxygen therapy.  Given that she has not had pulmonary function testing, I will order one today (spirometry, lung volumes, DLCO).   For management, she will benefit from triple therapy given her recurrent exacerbations.  I will prescribe Tiotropium, fluticasone, and vilanterol (Breva Respimat and Breo Ellipta).  I have also counseled the patient extensively on the importance of smoking cessation. I have also counseled her on vaccinations - she reports having had 3 COVID vaccines but is refusing the influenza and RSV vaccines. I encouraged her to re-consider.  - Tiotropium Bromide Monohydrate (SPIRIVA RESPIMAT) 2.5 MCG/ACT AERS; Inhale 2 puffs into the lungs daily.  Dispense: 1 each; Refill: 11 - fluticasone furoate-vilanterol (BREO ELLIPTA) 100-25 MCG/ACT AEPB; Inhale 1 puff into the lungs daily.  Dispense: 1 each; Refill: 11 - Pulmonary Function Test [facility] Only; Future  2. Cigarette nicotine dependence with nicotine-induced disorder  Counseled patient extensively regarding the importance of smoking cessation. Patient willing to attempt to quit. Will prescribe nicotine lozenges as well as nicotine patches.  - nicotine (NICODERM CQ - DOSED IN MG/24 HR) 7 mg/24hr patch; Place 1 patch (7 mg total) onto the skin daily  for 14 days.  Dispense: 14 patch; Refill: 0 - nicotine (NICODERM CQ - DOSED IN MG/24 HOURS) 21 mg/24hr patch; Place 1 patch (21 mg total) onto the skin daily.  Dispense: 42 patch; Refill: 0 - nicotine (NICODERM CQ - DOSED IN MG/24 HOURS) 14 mg/24hr patch; Place 1 patch (14 mg total) onto the skin daily for 14 days.  Dispense: 14 patch; Refill: 0 - nicotine polacrilex (NICOTINE MINI) 2 MG lozenge; Take 1 lozenge (2 mg total) by mouth every 2 (two) hours as needed for smoking cessation.  Dispense: 72 lozenge; Refill: 3   Return in about 3 months (around 11/22/2022).  I spent 70 minutes caring for this patient today, including preparing to see the patient, obtaining and/or reviewing separately obtained history, performing a medically appropriate examination and/or evaluation, counseling and educating the patient/family/caregiver, ordering medications, tests, or procedures, documenting clinical information in the electronic health record, and independently interpreting results (not separately reported/billed) and communicating results to the patient/family/caregiver  Raechel Chute, MD [facility] 08/22/2022 8:57 AM    End of visit medications:  Meds ordered this encounter  Medications   Tiotropium Bromide Monohydrate (SPIRIVA RESPIMAT) 2.5 MCG/ACT AERS    Sig: Inhale 2 puffs into the lungs daily.    Dispense:  1 each    Refill:  11   fluticasone furoate-vilanterol (BREO ELLIPTA) 100-25 MCG/ACT AEPB    Sig: Inhale 1 puff into the lungs daily.    Dispense:  1 each    Refill:  11   nicotine (NICODERM CQ - DOSED IN MG/24 HR) 7 mg/24hr patch    Sig: Place 1 patch (7 mg total) onto the skin  daily for 14 days.    Dispense:  14 patch    Refill:  0   nicotine (NICODERM CQ - DOSED IN MG/24 HOURS) 21 mg/24hr patch    Sig: Place 1 patch (21 mg total) onto the skin daily.    Dispense:  42 patch    Refill:  0   nicotine (NICODERM CQ - DOSED IN MG/24 HOURS) 14 mg/24hr patch     Sig: Place 1 patch (14 mg total) onto the skin daily for 14 days.    Dispense:  14 patch    Refill:  0   nicotine polacrilex (NICOTINE MINI) 2 MG lozenge    Sig: Take 1 lozenge (2 mg total) by mouth every 2 (two) hours as needed for smoking cessation.    Dispense:  72 lozenge    Refill:  3     Current Outpatient Medications:    albuterol (VENTOLIN HFA) 108 (90 Base) MCG/ACT inhaler, Inhale 2 puffs into the lungs every 6 (six) hours as needed for wheezing or shortness of breath., Disp: 8 g, Rfl: 2   celecoxib (CELEBREX) 200 MG capsule, Take 1 capsule (200 mg total) by mouth daily at 2 PM., Disp: 30 capsule, Rfl: 0   cyanocobalamin (VITAMIN B12) 1000 MCG tablet, Take 1,000 mcg by mouth daily., Disp: , Rfl:    esomeprazole (NEXIUM) 40 MG capsule, Take 40 mg by mouth daily., Disp: , Rfl:    fluticasone furoate-vilanterol (BREO ELLIPTA) 100-25 MCG/ACT AEPB, Inhale 1 puff into the lungs daily., Disp: 1 each, Rfl: 11   nicotine (NICODERM CQ - DOSED IN MG/24 HOURS) 14 mg/24hr patch, Place 1 patch (14 mg total) onto the skin daily for 14 days., Disp: 14 patch, Rfl: 0   nicotine (NICODERM CQ - DOSED IN MG/24 HOURS) 21 mg/24hr patch, Place 1 patch (21 mg total) onto the skin daily., Disp: 42 patch, Rfl: 0   nicotine (NICODERM CQ - DOSED IN MG/24 HR) 7 mg/24hr patch, Place 1 patch (7 mg total) onto the skin daily for 14 days., Disp: 14 patch, Rfl: 0   nicotine polacrilex (NICOTINE MINI) 2 MG lozenge, Take 1 lozenge (2 mg total) by mouth every 2 (two) hours as needed for smoking cessation., Disp: 72 lozenge, Rfl: 3   Tiotropium Bromide Monohydrate (SPIRIVA RESPIMAT) 2.5 MCG/ACT AERS, Inhale 2 puffs into the lungs daily., Disp: 1 each, Rfl: 11   traZODone (DESYREL) 50 MG tablet, Take 0.5-1 tablets (25-50 mg total) by mouth at bedtime as needed for sleep., Disp: 90 tablet, Rfl: 0   Subjective:   PATIENT ID: Jennifer Hayes GENDER: female DOB: 1940/10/14, MRN: [id number]  Chief Complaint  Patient  presents with   pulmonary consult    Recent admission. C/o SOB with exertion, wheezing and nasal drainage.    HPI  This is a 81 year old female with past medical history of COPD and tobacco use who presents to clinic for the evaluation of shortness of breath.  Patient was recently admitted to the hospital and left AGAINST MEDICAL ADVICE 4 days ago after admission for acute hypoxic respiratory failure and shortness of breath.  At that time, CT scan of the chest showed scattered patchy opacities with no pulmonary embolism and no masses.  She was treated for COPD exacerbation but had left before completing treatment.  Prior to that, there were other admissions to the hospital for similar chief complaint of shortness of breath treated for pneumonia with antibiotics and COPD exacerbation with steroids and nebulizers.  I do note  that she was discharged in October on Trelegy inhaler which she reports using sporadically.  Today, the patient reports her main symptom is that of shortness of breath, especially with exertion.  She is not short of breath as much at rest and is able to do minimal activities of daily living (walk around the house for example) but does get significantly short of breath walking to her car.  She tells me that she drives her car to the store (goes there to buy cigarettes) and that leaves her very short of breath.  She reports occasional chest tightness, occasional cough that is nonproductive, and denies any hemoptysis.  She reports her weight has been stable.  She does not have any lower extremity edema, redness, or pain.  She reports wheezing that is frequent.  Her current inhalers include albuterol and Combivent and she is not using the Trelegy.  She reports some improvement with the current inhalers but continues to report shortness of breath.  She is a current smoker (1 pack a day) and has smoked for the past 50 years.  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  She lives in a one-story house and has no stairs to use.  Ancillary information including prior medications, full medical/surgical/family/social histories, and PFTs (when available) are listed below and have been reviewed.   Review of Systems  Constitutional:  Negative for chills and fever.  Respiratory:  Positive for cough, sputum production and shortness of breath.   Cardiovascular:  Negative for chest pain.     Objective:   Vitals:   08/22/22 0834  BP: 122/74  Pulse: 83  Temp: 97.8 F (36.6 C)  TempSrc: Temporal  SpO2: 92%  Weight: 172 lb 3.2 oz (78.1 kg)  Height:  (1.575 m)   92% on RA. Patient's oxygen level did not drop after trending 300 feet around the office and finished at 90% on RA.  BMI Readings from Last 3 Encounters:  08/22/22 31.50 kg/m  08/18/22 30.39 kg/m  08/05/22 29.76 kg/m   Wt Readings from Last 3 Encounters:  08/22/22 172 lb 3.2 oz (78.1 kg)  08/18/22 171 lb 8.3 oz (77.8 kg)  08/05/22 167 lb 15.9 oz (76.2 kg)    Physical Exam Constitutional:      General: She is not in acute distress.    Appearance: She is obese. She is ill-appearing.  HENT:     Mouth/Throat:     Mouth: Mucous membranes are moist.  Eyes:     Extraocular Movements: Extraocular movements intact.     Pupils: Pupils are equal, round, and reactive to light.  Cardiovascular:     Rate and Rhythm: Normal rate and regular rhythm.     Heart sounds: Normal heart sounds.  Pulmonary:     Breath sounds: Wheezing and rhonchi present.  Abdominal:     Palpations: Abdomen is soft.  Musculoskeletal:     Right lower leg: No edema.     Left lower leg: No edema.  Neurological:     General: No focal deficit present.     Mental Status: She is alert and oriented to person, place, and time.     Ancillary Information    Past Medical History:  Diagnosis Date   Arthritis    CHF (congestive heart failure) (HCC)    COPD (chronic obstructive  pulmonary disease) (HCC)    Depression    GERD (gastroesophageal reflux disease)  Stroke [facility](HCC)      Family History  Problem Relation Age of Onset   Dementia Mother    Diabetes Mother      Past Surgical History:  Procedure Laterality Date   APPENDECTOMY     BACK SURGERY      Social History   Socioeconomic History   Marital status: Widowed    Spouse name: Not on file   Number of children: Not on file   Years of education: Not on file   Highest education level: Not on file  Occupational History   Not on file  Tobacco Use   Smoking status: Every Day    Packs/day: 0.50    Years: 50.00    Total pack years: 25.00    Types: Cigarettes   Smokeless tobacco: Never  Vaping Use   Vaping Use: Some days  Substance and Sexual Activity   Alcohol use: Not Currently    Comment: occasionally   Drug use: Never   Sexual activity: Yes  Other Topics Concern   Not on file  Social History Narrative   She lives alone at home and has a cat that she adores.    Social Determinants of Health   Financial Resource Strain: Low Risk  (04/28/2022)   Overall Financial Resource Strain (CARDIA)    Difficulty of Paying Living Expenses: Not hard at all  Food Insecurity: No Food Insecurity (08/18/2022)   Hunger Vital Sign    Worried About Running Out of Food in the Last Year: Never true    Ran Out of Food in the Last Year: Never true  Transportation Needs: No Transportation Needs (08/18/2022)   PRAPARE - Administrator, Civil ServiceTransportation    Lack of Transportation (Medical): No    Lack of Transportation (Non-Medical): No  Physical Activity: Inactive (04/28/2022)   Exercise Vital Sign    Days of Exercise per Week: 0 days    Minutes of Exercise per Session: 0 min  Stress: No Stress Concern Present (04/28/2022)   [employer] - Occupational Stress Questionnaire    Feeling of Stress : Only a little  Social Connections: Moderately Integrated (04/28/2022)   Social Connection and Isolation  Panel [NHANES]    Frequency of Communication with Friends and Family: More than three times a week    Frequency of Social Gatherings with Friends and Family: More than three times a week    Attends Religious Services: More than 4 times per year    Active Member of [employer] or Organizations: Yes    Attends BankerClub or Organization Meetings: 1 to 4 times per year    Marital Status: Widowed  Intimate Partner Violence: Not At Risk (08/18/2022)   Humiliation, Afraid, Rape, and Kick questionnaire    Fear of Current or Ex-Partner: No    Emotionally Abused: No    Physically Abused: No    Sexually Abused: No     Allergies  Allergen Reactions   Bupropion Swelling    headache   Penicillins Rash    Rash x 30 years ago     CBC    Component Value Date/Time   WBC 8.5 08/18/2022 0552   RBC 4.37 08/18/2022 0552   HGB 13.0 08/18/2022 0552   HGB 12.7 06/05/2019 0844   HCT 39.3 08/18/2022 0552   HCT 38.7 06/05/2019 0844   PLT 258 08/18/2022 0552   PLT 204 06/05/2019 0844   MCV 89.9 08/18/2022 0552   MCV 91 06/05/2019 0844   MCV 87 03/11/2013 0039  MCH 29.7 08/18/2022 0552   MCHC 33.1 08/18/2022 0552   RDW 12.6 08/18/2022 0552   RDW 12.7 06/05/2019 0844   RDW 14.0 03/11/2013 0039   LYMPHSABS 1.5 08/18/2022 0552   LYMPHSABS 2.8 06/05/2019 0844   MONOABS 0.7 08/18/2022 0552   EOSABS 0.3 08/18/2022 0552   EOSABS 0.2 06/05/2019 0844   BASOSABS 0.1 08/18/2022 0552   BASOSABS 0.1 06/05/2019 0844    Pulmonary Functions Testing Results:     No data to display          Outpatient Medications Prior to Visit  Medication Sig Dispense Refill   albuterol (VENTOLIN HFA) 108 (90 Base) MCG/ACT inhaler Inhale 2 puffs into the lungs every 6 (six) hours as needed for wheezing or shortness of breath. 8 g 2   celecoxib (CELEBREX) 200 MG capsule Take 1 capsule (200 mg total) by mouth daily at 2 PM. 30 capsule 0   cyanocobalamin (VITAMIN B12) 1000 MCG tablet Take 1,000 mcg by mouth daily.      esomeprazole (NEXIUM) 40 MG capsule Take 40 mg by mouth daily.     traZODone (DESYREL) 50 MG tablet Take 0.5-1 tablets (25-50 mg total) by mouth at bedtime as needed for sleep. 90 tablet 0   Fluticasone-Umeclidin-Vilant (TRELEGY ELLIPTA) 100-62.5-25 MCG/ACT AEPB Inhale 1 puff into the lungs daily. 60 each 0   nicotine (NICODERM CQ - DOSED IN MG/24 HOURS) 21 mg/24hr patch One 21mg  patch chest wall daily (okay to substitute generic) 28 patch 0   predniSONE (DELTASONE) 20 MG tablet Two tabs po daily for three days 6 tablet 0   No facility-administered medications prior to visit.

## 2022-08-22 NOTE — Patient Instructions (Signed)
The [place] Quitline: Call 1-800-QUIT-NOW (1-800-784-8669). The [place] Quitline is a free service for [place] residents. Trained counselors are available from 8 am until 3 am, 365 days per year. Services are available in both English and Spanish.   Web Resources Free online support programs can help you track your progress and share experiences with others who are quitting. These are examples: www.becomeanex.org www.trytostop.org  www.smokefree.gov  www.everydayhealth.com/smoking-cessation/index.aspx   Tobacco Cessation Medications  Nicotine Replacement Therapy (NRT)  Nicotine is the addictive part of tobacco smoke, but not the most dangerous part. There are 7000 other toxins in cigarettes, including carbon monoxide, that cause disease. People do not generally become addicted to medication. Common problems: People don't use enough medication or stop too early. Medications are safe and effective. Overdose is very uncommon. Use medications as long as needed (3 months minimum). Some combinations work better than single medications. Long acting medications like the NRT patch and bupropion provide continuous treatment for withdrawal symptoms.  PLUS  Short acting medications like the NRT gum, lozenge, inhaler, and nasal spray help people to cope with breakthrough cravings.  ? Nicotine Patch  Place patch on hairless skin on upper body, including arms and back. Each day: discard old patch, shower, apply new patch to a different site. Apply hydrocortisone cream to mildly red/irritated areas. Call provider if rash develops. If patch causes sleep disturbance, remove patch at bedtime and replace each morning after shower. Side effects may include: skin irritation, headache, insomnia, abnormal/vivid dreams.  ? Nicotine Lozenge  Allow to dissolve slowly in mouth (20-30 minutes). Do not chew or swallow. Nicotine release may cause a warm tingling sensation. Occasionally rotate to  different areas of the mouth. Use enough to control cravings, up to 20 lozenges per day (if used alone). Avoid eating or drinking for 15 minutes before using and during use. Side effects may include: nausea, hiccups, cough, heartburn, headache, gas, insomnia. 

## 2022-08-23 ENCOUNTER — Telehealth: Payer: Self-pay

## 2022-08-23 LAB — CULTURE, BLOOD (ROUTINE X 2)
Culture: NO GROWTH
Culture: NO GROWTH

## 2022-08-23 NOTE — Telephone Encounter (Signed)
Tiffany at [facility] called asking for verbals for nursing 1 week 9. The verbal was given.

## 2022-09-27 ENCOUNTER — Telehealth: Payer: Self-pay

## 2022-09-27 MED ORDER — CELECOXIB 200 MG PO CAPS: 200.0000 mg | ORAL_CAPSULE | Freq: Every day | ORAL | 0 refills | Status: DC

## 2022-09-27 NOTE — Telephone Encounter (Signed)
MEDICATION:celecoxib (CELEBREX) 200 MG capsule   PHARMACY:[id number] - [place], [place] - [address] AT [facility] OF [address]   Comments: patient is completely out   **Let patient know to contact pharmacy at the end of the day to make sure medication is ready. **  ** Please notify patient to allow 48-72 hours to process**  **Encourage patient to contact the pharmacy for refills or they can request refills through [facility]**

## 2023-03-13 NOTE — Addendum Note (Signed)
Addended by: Lajoyce Lauber A on: 03/13/2023 01:39 PM   Modules accepted: Orders

## 2023-04-03 ENCOUNTER — Ambulatory Visit: Admitting: Student in an Organized Health Care Education/Training Program

## 2023-04-03 ENCOUNTER — Ambulatory Visit

## 2023-06-12 ENCOUNTER — Ambulatory Visit: Payer: Medicare Other | Attending: Student in an Organized Health Care Education/Training Program

## 2023-06-12 DIAGNOSIS — J449 Chronic obstructive pulmonary disease, unspecified: Secondary | ICD-10-CM | POA: Diagnosis present

## 2023-06-12 DIAGNOSIS — F17210 Nicotine dependence, cigarettes, uncomplicated: Secondary | ICD-10-CM | POA: Diagnosis not present

## 2023-06-12 LAB — PULMONARY FUNCTION TEST ARMC ONLY
DL/VA % pred: 90 %
DL/VA: 3.75 ml/min/mmHg/L
DLCO unc % pred: 60 %
DLCO unc: 10.56 ml/min/mmHg
FEF 25-75 Post: 1.27 L/s
FEF 25-75 Pre: 0.65 L/s
FEF2575-%Change-Post: 96 %
FEF2575-%Pred-Post: 98 %
FEF2575-%Pred-Pre: 50 %
FEV1-%Change-Post: 25 %
FEV1-%Pred-Post: 68 %
FEV1-%Pred-Pre: 54 %
FEV1-Post: 1.19 L
FEV1-Pre: 0.95 L
FEV1FVC-%Change-Post: -3 %
FEV1FVC-%Pred-Pre: 97 %
FEV6-%Change-Post: 30 %
FEV6-%Pred-Post: 76 %
FEV6-%Pred-Pre: 58 %
FEV6-Post: 1.71 L
FEV6-Pre: 1.31 L
FEV6FVC-%Pred-Post: 106 %
FEV6FVC-%Pred-Pre: 106 %
FVC-%Change-Post: 30 %
FVC-%Pred-Post: 72 %
FVC-%Pred-Pre: 55 %
FVC-Post: 1.71 L
FVC-Pre: 1.31 L
Post FEV1/FVC ratio: 70 %
Post FEV6/FVC ratio: 100 %
Pre FEV1/FVC ratio: 72 %
Pre FEV6/FVC Ratio: 100 %
RV % pred: 114 %
RV: 2.62 L
TLC % pred: 84 %
TLC: 4.03 L

## 2023-06-12 MED ORDER — ALBUTEROL SULFATE (2.5 MG/3ML) 0.083% IN NEBU
2.5000 mg | INHALATION_SOLUTION | Freq: Once | RESPIRATORY_TRACT | Status: AC
  Administered 2023-06-12: 2.5 mg via RESPIRATORY_TRACT
  Filled 2023-06-12: qty 3

## 2023-06-18 ENCOUNTER — Ambulatory Visit (INDEPENDENT_AMBULATORY_CARE_PROVIDER_SITE_OTHER): Payer: Medicare Other | Admitting: Student in an Organized Health Care Education/Training Program

## 2023-06-18 ENCOUNTER — Encounter: Payer: Self-pay | Admitting: Student in an Organized Health Care Education/Training Program

## 2023-06-18 VITALS — BP 142/78 | HR 79 | Temp 97.6°F | Resp 24 | Ht 62.0 in | Wt 178.6 lb

## 2023-06-18 DIAGNOSIS — J449 Chronic obstructive pulmonary disease, unspecified: Secondary | ICD-10-CM

## 2023-06-18 DIAGNOSIS — Z23 Encounter for immunization: Secondary | ICD-10-CM | POA: Diagnosis not present

## 2023-06-18 DIAGNOSIS — F17210 Nicotine dependence, cigarettes, uncomplicated: Secondary | ICD-10-CM | POA: Diagnosis not present

## 2023-06-18 MED ORDER — IPRATROPIUM-ALBUTEROL 0.5-2.5 (3) MG/3ML IN SOLN: 3.0000 mL | Freq: Four times a day (QID) | RESPIRATORY_TRACT | 12 refills | Status: DC | PRN

## 2023-06-18 MED ORDER — SPIRIVA RESPIMAT 2.5 MCG/ACT IN AERS: 2.0000 | INHALATION_SPRAY | Freq: Every day | RESPIRATORY_TRACT | 11 refills | Status: AC

## 2023-06-18 MED ORDER — FLUTICASONE FUROATE-VILANTEROL 100-25 MCG/ACT IN AEPB: 1.0000 | INHALATION_SPRAY | Freq: Every day | RESPIRATORY_TRACT | 11 refills | Status: AC

## 2023-06-18 NOTE — Progress Notes (Signed)
Synopsis: Referred in re COPD by Corky Downs, MD  Assessment & Plan:   1. Chronic obstructive pulmonary disease, unspecified COPD type (HCC)  She is presenting for follow-up in regards to his COPD with PFTs being consistent with obstruction with a component of reversibility following albuterol administration.  Unfortunately, she continues to smoke and has no interest in smoking cessation despite worsening respiratory function.  I have explained to the patient that is imperative that she attempt to stop smoking.  She did run out of her Spiriva and it is unclear if she is using her Breo Ellipta as prescribed.  I discussed this with the patient as well as with her daughter and explained the importance of compliance with both inhalers.  [facility]. She is agreeable to the flu vaccine today and I encouraged her to seek out the COVID and RSV vaccines at her local pharmacy.   - Tiotropium Bromide Monohydrate (SPIRIVA RESPIMAT) 2.5 MCG/ACT AERS; Inhale 2 puffs into the lungs daily.  Dispense: 1 each; Refill: 11 - fluticasone furoate-vilanterol (BREO ELLIPTA) 100-25 MCG/ACT AEPB; Inhale 1 puff into the lungs daily.  Dispense: 1 each; Refill: 11 - ipratropium-albuterol (DUONEB) 0.5-2.5 (3) MG/3ML SOLN; Take 3 mLs by nebulization every 6 (six) hours as needed.  Dispense: 360 mL; Refill: 12 - Flu vaccine in clinic today  2. Tobacco Use Disorder  Discussed the importance of smoking cessation. Previously prescribed nicotine replacement therapy. Patient counseled extensively re importance of quitting. She is not ready to quit.  Return in about 6 months (around 12/16/2023).  I spent 30 minutes caring for this patient today, including preparing to see the patient, obtaining a medical history , reviewing a separately obtained history, performing a medically appropriate examination and/or evaluation, counseling and educating the  patient/family/caregiver, ordering medications, tests, or procedures, and documenting clinical information in the electronic health record. 3 minutes spent on smoking cessation counseling.  Raechel Chute, MD [facility] 06/18/2023 1:50 PM    End of visit medications:  Meds ordered this encounter  Medications   Tiotropium Bromide Monohydrate (SPIRIVA RESPIMAT) 2.5 MCG/ACT AERS    Sig: Inhale 2 puffs into the lungs daily.    Dispense:  1 each    Refill:  11   fluticasone furoate-vilanterol (BREO ELLIPTA) 100-25 MCG/ACT AEPB    Sig: Inhale 1 puff into the lungs daily.    Dispense:  1 each    Refill:  11   ipratropium-albuterol (DUONEB) 0.5-2.5 (3) MG/3ML SOLN    Sig: Take 3 mLs by nebulization every 6 (six) hours as needed.    Dispense:  360 mL    Refill:  12     Current Outpatient Medications:    albuterol (VENTOLIN HFA) 108 (90 Base) MCG/ACT inhaler, Inhale 2 puffs into the lungs every 6 (six) hours as needed for wheezing or shortness of breath., Disp: 8 g, Rfl: 2   celecoxib (CELEBREX) 200 MG capsule, Take 1 capsule (200 mg total) by mouth daily at 2 PM., Disp: 90 capsule, Rfl: 0   cyanocobalamin (VITAMIN B12) 1000 MCG tablet, Take 1,000 mcg by mouth daily., Disp: , Rfl:    esomeprazole (NEXIUM) 40 MG capsule, Take 40 mg by mouth daily., Disp: , Rfl:    ipratropium-albuterol (DUONEB) 0.5-2.5 (3) MG/3ML SOLN, Take 3 mLs by nebulization every 6 (six) hours as needed., Disp: 360 mL, Rfl: 12   traZODone (DESYREL) 50 MG tablet, Take 0.5-1 tablets (25-50 mg  total) by mouth at bedtime as needed for sleep., Disp: 90 tablet, Rfl: 0   fluticasone furoate-vilanterol (BREO ELLIPTA) 100-25 MCG/ACT AEPB, Inhale 1 puff into the lungs daily., Disp: 1 each, Rfl: 11   Tiotropium Bromide Monohydrate (SPIRIVA RESPIMAT) 2.5 MCG/ACT AERS, Inhale 2 puffs into the lungs daily., Disp: 1 each, Rfl: 11   Subjective:   PATIENT ID: Jennifer Hayes GENDER: female DOB: [birth year],  MRN: [id number]  Chief Complaint  Patient presents with   Follow-up    Last ov 08/22/22 started on (SPIRIVA RESPIMAT) 2.5 MCG/ACT AERS, BREO ELLIPTA, Nicoderm patches and lozenges. Reports has been off inhalers for about 2 months. Patient reports she is still smoking. Reports cough, shortness of breath and wheezing. She does use oxygen sometimes at home.     HPI  This is a 82 year old female with past medical history of COPD and tobacco use who presents to clinic for the evaluation of shortness of breath.   Patient was admitted to the hospital in November of 2023 and left AGAINST MEDICAL ADVICE after admission for acute hypoxic respiratory failure and shortness of breath.  At that time, CT scan of the chest showed scattered patchy opacities with no pulmonary embolism and no masses.  She was treated for COPD exacerbation but had left before completing treatment.  Prior to that, there were other admissions to the hospital for similar chief complaint of shortness of breath treated for pneumonia with antibiotics and COPD exacerbation with steroids and nebulizers.  I do note that she was discharged in October on Trelegy inhaler which she reports using sporadically.  Following her initial visit with me in November, we attempted to started triple therapy with Breo Ellipta and Spiriva Respimat. The patient reports some improvement in symptoms during today's visit. She continues to  be short of breath with exertion and continues to have a cough. This is mildly improved compared to prior. She ran out of Spiriva and is unsure about her Virgel Bouquet, though she has it with her she believed she was still on Trelegy. She does not have a rescue inhaler or nebulizer.   She reports occasional chest tightness, occasional cough that is nonproductive, and denies any hemoptysis.  She reports her weight has been stable.  She does not have any lower extremity edema, redness, or pain.  She reports wheezing.  Her   She is a current  smoker (1 pack a day) and has smoked for the past 50 years.  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 She reports also working for the [place].  She lives in a one-story house and has no stairs to use.  Ancillary information including prior medications, full medical/surgical/family/social histories, and PFTs (when available) are listed below and have been reviewed.   Review of Systems  Constitutional:  Negative for chills and fever.  Respiratory:  Positive for cough, sputum production and shortness of breath.   Cardiovascular:  Negative for chest pain.     Objective:   Vitals:   06/18/23 1323  BP: (!) 142/78  Pulse: 79  Resp: (!) 24  Temp: 97.6 F (36.4 C)  TempSrc: Temporal  SpO2: 95%  Weight: 178 lb 9.6 oz (81 kg)  Height: 5\' 2"  (1.575 m)   95% on RA BMI Readings from Last 3 Encounters:  06/18/23 32.67 kg/m  08/22/22 31.50 kg/m  08/18/22 30.39 kg/m   Wt Readings from Last 3 Encounters:  06/18/23 178 lb 9.6  oz (81 kg)  08/22/22 172 lb 3.2 oz (78.1 kg)  08/18/22 171 lb 8.3 oz (77.8 kg)    Physical Exam Constitutional:      General: She is not in acute distress.    Appearance: She is obese. She is ill-appearing.  HENT:     Mouth/Throat:     Mouth: Mucous membranes are moist.  Eyes:     Extraocular Movements: Extraocular movements intact.     Pupils: Pupils are equal, round, and reactive to light.  Cardiovascular:     Rate and Rhythm: Normal rate and regular rhythm.     Pulses: Normal pulses.     Heart sounds: Normal heart sounds.  Pulmonary:     Breath sounds: No wheezing or rhonchi.  Abdominal:     Palpations: Abdomen is soft.  Musculoskeletal:     Right lower leg: No edema.     Left lower leg: No edema.  Neurological:     General: No focal deficit present.     Mental Status: She is alert and oriented to person, place, and time.     Ancillary Information    Past Medical History:  Diagnosis  Date   Arthritis    CHF (congestive heart failure) (HCC)    COPD (chronic obstructive pulmonary disease) (HCC)    Depression    GERD (gastroesophageal reflux disease)    Stroke (HCC)      Family History  Problem Relation Age of Onset   Dementia Mother    Diabetes Mother      Past Surgical History:  Procedure Laterality Date   APPENDECTOMY     BACK SURGERY      Social History   Socioeconomic History   Marital status: Widowed    Spouse name: Not on file   Number of children: Not on file   Years of education: Not on file   Highest education level: Not on file  Occupational History   Not on file  Tobacco Use   Smoking status: Every Day    Current packs/day: 0.50    Average packs/day: 0.5 packs/day for 50.0 years (25.0 ttl pk-yrs)    Types: Cigarettes   Smokeless tobacco: Never  Vaping Use   Vaping status: Some Days  Substance and Sexual Activity   Alcohol use: Not Currently    Comment: occasionally   Drug use: Never   Sexual activity: Yes  Other Topics Concern   Not on file  Social History Narrative   She lives alone at home and has a cat that she adores.    Social Determinants of Health   Financial Resource Strain: Low Risk  (04/28/2022)   Overall Financial Resource Strain (CARDIA)    Difficulty of Paying Living Expenses: Not hard at all  Food Insecurity: No Food Insecurity (08/18/2022)   Hunger Vital Sign    Worried About Running Out of Food in the Last Year: Never true    Ran Out of Food in the Last Year: Never true  Transportation Needs: No Transportation Needs (08/18/2022)   PRAPARE - Administrator, Civil Service (Medical): No    Lack of Transportation (Non-Medical): No  Physical Activity: Inactive (04/28/2022)   Exercise Vital Sign    Days of Exercise per Week: 0 days    Minutes of Exercise per Session: 0 min  Stress: No Stress Concern Present (04/28/2022)   [employer] of Occupational Health - Occupational Stress Questionnaire     Feeling of Stress : Only a little  Social Connections: Moderately Integrated (04/28/2022)   Social Connection and Isolation Panel [NHANES]    Frequency of Communication with Friends and Family: More than three times a week    Frequency of Social Gatherings with Friends and Family: More than three times a week    Attends Religious Services: More than 4 times per year    Active Member of [employer] or Organizations: Yes    Attends Banker Meetings: 1 to 4 times per year    Marital Status: Widowed  Intimate Partner Violence: Not At Risk (08/18/2022)   Humiliation, Afraid, Rape, and Kick questionnaire    Fear of Current or Ex-Partner: No    Emotionally Abused: No    Physically Abused: No    Sexually Abused: No     Allergies  Allergen Reactions   Bupropion Swelling    headache   Penicillins Rash    Rash x 30 years ago     CBC    Component Value Date/Time   WBC 8.5 08/18/2022 0552   RBC 4.37 08/18/2022 0552   HGB 13.0 08/18/2022 0552   HGB 12.7 06/05/2019 0844   HCT 39.3 08/18/2022 0552   HCT 38.7 06/05/2019 0844   PLT 258 08/18/2022 0552   PLT 204 06/05/2019 0844   MCV 89.9 08/18/2022 0552   MCV 91 06/05/2019 0844   MCV 87 03/11/2013 0039   MCH 29.7 08/18/2022 0552   MCHC 33.1 08/18/2022 0552   RDW 12.6 08/18/2022 0552   RDW 12.7 06/05/2019 0844   RDW 14.0 03/11/2013 0039   LYMPHSABS 1.5 08/18/2022 0552   LYMPHSABS 2.8 06/05/2019 0844   MONOABS 0.7 08/18/2022 0552   EOSABS 0.3 08/18/2022 0552   EOSABS 0.2 06/05/2019 0844   BASOSABS 0.1 08/18/2022 0552   BASOSABS 0.1 06/05/2019 0844    Pulmonary Functions Testing Results:    Latest Ref Rng & Units 06/12/2023    9:25 AM  PFT Results  FVC-Pre L 1.31  P  FVC-Predicted Pre % 55  P  FVC-Post L 1.71  P  FVC-Predicted Post % 72  P  Pre FEV1/FVC % % 72  P  Post FEV1/FCV % % 70  P  FEV1-Pre L 0.95  P  FEV1-Predicted Pre % 54  P  FEV1-Post L 1.19  P  DLCO uncorrected ml/min/mmHg 10.56  P  DLCO UNC% % 60  P   DLVA Predicted % 90  P  TLC L 4.03  P  TLC % Predicted % 84  P  RV % Predicted % 114  P    P Preliminary result    Outpatient Medications Prior to Visit  Medication Sig Dispense Refill   albuterol (VENTOLIN HFA) 108 (90 Base) MCG/ACT inhaler Inhale 2 puffs into the lungs every 6 (six) hours as needed for wheezing or shortness of breath. 8 g 2   celecoxib (CELEBREX) 200 MG capsule Take 1 capsule (200 mg total) by mouth daily at 2 PM. 90 capsule 0   cyanocobalamin (VITAMIN B12) 1000 MCG tablet Take 1,000 mcg by mouth daily.     esomeprazole (NEXIUM) 40 MG capsule Take 40 mg by mouth daily.     traZODone (DESYREL) 50 MG tablet Take 0.5-1 tablets (25-50 mg total) by mouth at bedtime as needed for sleep. 90 tablet 0   fluticasone furoate-vilanterol (BREO ELLIPTA) 100-25 MCG/ACT AEPB Inhale 1 puff into the lungs daily. 1 each 11   Tiotropium Bromide Monohydrate (SPIRIVA RESPIMAT) 2.5 MCG/ACT AERS Inhale 2 puffs into the lungs daily.  1 each 11   No facility-administered medications prior to visit.

## 2023-08-06 ENCOUNTER — Other Ambulatory Visit: Payer: Self-pay | Admitting: Family Medicine

## 2023-08-06 ENCOUNTER — Encounter: Payer: Self-pay | Admitting: Family Medicine

## 2023-08-06 ENCOUNTER — Ambulatory Visit (INDEPENDENT_AMBULATORY_CARE_PROVIDER_SITE_OTHER): Payer: Medicare Other | Admitting: Family Medicine

## 2023-08-06 VITALS — BP 120/62 | HR 72 | Resp 19 | Ht 62.0 in | Wt 176.2 lb

## 2023-08-06 DIAGNOSIS — M150 Primary generalized (osteo)arthritis: Secondary | ICD-10-CM

## 2023-08-06 DIAGNOSIS — E538 Deficiency of other specified B group vitamins: Secondary | ICD-10-CM

## 2023-08-06 DIAGNOSIS — R7309 Other abnormal glucose: Secondary | ICD-10-CM

## 2023-08-06 DIAGNOSIS — F331 Major depressive disorder, recurrent, moderate: Secondary | ICD-10-CM | POA: Diagnosis not present

## 2023-08-06 DIAGNOSIS — M25511 Pain in right shoulder: Secondary | ICD-10-CM

## 2023-08-06 DIAGNOSIS — M25552 Pain in left hip: Secondary | ICD-10-CM

## 2023-08-06 DIAGNOSIS — M25551 Pain in right hip: Secondary | ICD-10-CM

## 2023-08-06 DIAGNOSIS — J431 Panlobular emphysema: Secondary | ICD-10-CM

## 2023-08-06 DIAGNOSIS — Z7689 Persons encountering health services in other specified circumstances: Secondary | ICD-10-CM | POA: Diagnosis not present

## 2023-08-06 DIAGNOSIS — E782 Mixed hyperlipidemia: Secondary | ICD-10-CM

## 2023-08-06 DIAGNOSIS — K219 Gastro-esophageal reflux disease without esophagitis: Secondary | ICD-10-CM

## 2023-08-06 DIAGNOSIS — G8929 Other chronic pain: Secondary | ICD-10-CM

## 2023-08-06 DIAGNOSIS — M12811 Other specific arthropathies, not elsewhere classified, right shoulder: Secondary | ICD-10-CM

## 2023-08-06 DIAGNOSIS — F5101 Primary insomnia: Secondary | ICD-10-CM

## 2023-08-06 DIAGNOSIS — Z0000 Encounter for general adult medical examination without abnormal findings: Secondary | ICD-10-CM

## 2023-08-06 MED ORDER — TRAZODONE HCL 50 MG PO TABS: 50.0000 mg | ORAL_TABLET | Freq: Every evening | ORAL | 3 refills | Status: DC | PRN

## 2023-08-06 MED ORDER — TRAZODONE HCL 50 MG PO TABS: 50.0000 mg | ORAL_TABLET | Freq: Every evening | ORAL | 0 refills | Status: DC | PRN

## 2023-08-06 MED ORDER — CITALOPRAM HYDROBROMIDE 40 MG PO TABS: 40.0000 mg | ORAL_TABLET | Freq: Every day | ORAL | 3 refills | Status: DC

## 2023-08-06 MED ORDER — CITALOPRAM HYDROBROMIDE 40 MG PO TABS: 40.0000 mg | ORAL_TABLET | Freq: Every day | ORAL | 0 refills | Status: DC

## 2023-08-06 MED ORDER — CELECOXIB 200 MG PO CAPS: 200.0000 mg | ORAL_CAPSULE | Freq: Every day | ORAL | 3 refills | Status: DC

## 2023-08-06 MED ORDER — ESOMEPRAZOLE MAGNESIUM 40 MG PO CPDR: 40.0000 mg | DELAYED_RELEASE_CAPSULE | Freq: Every day | ORAL | 3 refills | Status: AC

## 2023-08-06 NOTE — Progress Notes (Addendum)
Subjective:    Patient ID: Jennifer Hayes, female    DOB: 04/27/1941, 83 y.o.   MRN: [id number]  Jennifer Hayes is a 82 y.o. female presenting on 08/06/2023 for Establish Care, Depression, and COPD   HPI  Establish care with new PCP  Previously lived in [place] with husband, and they moved up to [place] to stay with children 20 + years ago.  Discussed the use of AI scribe software for clinical note transcription with the patient, who gave verbal consent to proceed.     The patient, with a history of depression, sleep disturbances, COPD, arthritis, and stomach acid issues, presents for a routine check-up and medication refills.   Major Depression, recurrent moderate Her husband passed 10 years ago, and she had depression 6 months onset after. Worsening lately off med for months. Need to restart Citalopram 40mg  daily + Trazodone, needs local pharmacy order + mail order The patient also reports occasional sleep disturbances, for which she takes trazodone as needed. She has been on this medication for the same duration as her antidepressant.  Centrilobular Emphysema Tobacco Abuse Smoking 1ppd 50+ years Regarding her COPD, the patient has been managing her symptoms with a rescue inhaler (Combivent) and a nebulizer as needed. She also has maintenance inhalers (Breo and Spiriva) but does not use them regularly. The patient has a long history of smoking, currently at a pack a day.  Osteoarthritis, multiple joints Followed by Orthopedics The patient also reports arthritis pain, particularly in her hips and shoulders. She receives shots every three months for this, but notes that the right shoulder, which had a previous surgery, does not respond well to the treatment. On Celebrex needs re order.  GERD The patient has a history of stomach acid issues, for which she takes medication as needed. She reports not having frequent issues with this.  The patient's medications are  primarily filled [facility], with a 30-day supply of her antidepressant and sleep aid [facility]. The patient prefers this method as it saves her money.       Health Maintenance:  Decline Pneumonia vaccine and Shingles vaccine.     08/06/2023   11:28 PM 04/28/2022   10:05 AM 03/24/2022   11:30 AM  Depression screen PHQ 2/9  Decreased Interest  0 0  Down, Depressed, Hopeless 2 0 0  PHQ - 2 Score 2 0 0  Altered sleeping 1 0 3  Tired, decreased energy 1 0 0  Change in appetite 1 0 0  Feeling bad or failure about yourself  1 0 0  Trouble concentrating  0 0  Moving slowly or fidgety/restless 1 0 0  Suicidal thoughts 0 0 0  PHQ-9 Score 7 0 3  Difficult doing work/chores Very difficult  Not difficult at all    Past Medical History:  Diagnosis Date   Arthritis    CHF (congestive heart failure) (HCC)    COPD (chronic obstructive pulmonary disease) (HCC)    Depression    GERD (gastroesophageal reflux disease)    Stroke [facility])    Past Surgical History:  Procedure Laterality Date   APPENDECTOMY     BACK SURGERY     Social History   Socioeconomic History   Marital status: Widowed    Spouse name: Not on file   Number of children: Not on file   Years of education: Not on file   Highest education level: Not on file  Occupational History   Not on file  Tobacco Use   Smoking status: Every Day    Current packs/day: 1.00    Average packs/day: 1 pack/day for 50.0 years (50.0 ttl pk-yrs)    Types: Cigarettes   Smokeless tobacco: Never  Vaping Use   Vaping status: Some Days  Substance and Sexual Activity   Alcohol use: Not Currently    Comment: occasionally   Drug use: Never   Sexual activity: Yes  Other Topics Concern   Not on file  Social History Narrative   She lives alone at home and has a cat that she adores.    Social Determinants of Health   Financial Resource Strain: Low Risk  (04/28/2022)   Overall Financial Resource  Strain (CARDIA)    Difficulty of Paying Living Expenses: Not hard at all  Food Insecurity: No Food Insecurity (08/18/2022)   Hunger Vital Sign    Worried About Running Out of Food in the Last Year: Never true    Ran Out of Food in the Last Year: Never true  Transportation Needs: No Transportation Needs (08/18/2022)   PRAPARE - Administrator, Civil Service (Medical): No    Lack of Transportation (Non-Medical): No  Physical Activity: Inactive (04/28/2022)   Exercise Vital Sign    Days of Exercise per Week: 0 days    Minutes of Exercise per Session: 0 min  Stress: No Stress Concern Present (04/28/2022)   [employer] of Occupational Health - Occupational Stress Questionnaire    Feeling of Stress : Only a little  Social Connections: Moderately Integrated (04/28/2022)   Social Connection and Isolation Panel [NHANES]    Frequency of Communication with Friends and Family: More than three times a week    Frequency of Social Gatherings with Friends and Family: More than three times a week    Attends Religious Services: More than 4 times per year    Active Member of [employer] or Organizations: Yes    Attends Banker Meetings: 1 to 4 times per year    Marital Status: Widowed  Intimate Partner Violence: Not At Risk (08/18/2022)   Humiliation, Afraid, Rape, and Kick questionnaire    Fear of Current or Ex-Partner: No    Emotionally Abused: No    Physically Abused: No    Sexually Abused: No   Family History  Problem Relation Age of Onset   Dementia Mother    Diabetes Mother    Current Outpatient Medications on File Prior to Visit  Medication Sig   albuterol (VENTOLIN HFA) 108 (90 Base) MCG/ACT inhaler Inhale 2 puffs into the lungs every 6 (six) hours as needed for wheezing or shortness of breath.   cyanocobalamin (VITAMIN B12) 1000 MCG tablet Take 1,000 mcg by mouth daily.   ipratropium-albuterol (DUONEB) 0.5-2.5 (3) MG/3ML SOLN Take 3 mLs by nebulization every 6  (six) hours as needed.   fluticasone furoate-vilanterol (BREO ELLIPTA) 100-25 MCG/ACT AEPB Inhale 1 puff into the lungs daily. (Patient not taking: Reported on 08/06/2023)   Tiotropium Bromide Monohydrate (SPIRIVA RESPIMAT) 2.5 MCG/ACT AERS Inhale 2 puffs into the lungs daily. (Patient not taking: Reported on 08/06/2023)   No current facility-administered medications on file prior to visit.    Review of Systems Per HPI unless specifically indicated above      Objective:    BP 120/62 (BP Location: Left Arm, Patient Position: Sitting, Cuff Size: Normal)   Pulse 72   Resp 19   Ht 5\' 2"  (1.575 m)  Wt 176 lb 3.2 oz (79.9 kg)   BMI 32.23 kg/m   Wt Readings from Last 3 Encounters:  08/06/23 176 lb 3.2 oz (79.9 kg)  06/18/23 178 lb 9.6 oz (81 kg)  08/22/22 172 lb 3.2 oz (78.1 kg)    Physical Exam Vitals and nursing note reviewed.  Constitutional:      General: She is not in acute distress.    Appearance: Normal appearance. She is well-developed. She is not diaphoretic.     Comments: Well-appearing, comfortable, cooperative  HENT:     Head: Normocephalic and atraumatic.  Eyes:     General:        Right eye: No discharge.        Left eye: No discharge.     Conjunctiva/sclera: Conjunctivae normal.  Cardiovascular:     Rate and Rhythm: Normal rate and regular rhythm.     Pulses: Normal pulses.  Pulmonary:     Effort: Pulmonary effort is normal.     Breath sounds: Wheezing present. No rhonchi.  Skin:    General: Skin is warm and dry.     Findings: No erythema or rash.  Neurological:     Mental Status: She is alert and oriented to person, place, and time.  Psychiatric:        Mood and Affect: Mood normal.        Behavior: Behavior normal.        Thought Content: Thought content normal.     Comments: Well groomed, good eye contact, normal speech and thoughts    Results for orders placed or performed in visit on 06/12/23  Pulmonary Function Test [facility] Only  Result Value Ref  Range   FVC-Pre 1.31 L   FVC-%Pred-Pre 55 %   FVC-Post 1.71 L   FVC-%Pred-Post 72 %   FVC-%Change-Post 30 %   FEV1-Pre 0.95 L   FEV1-%Pred-Pre 54 %   FEV1-Post 1.19 L   FEV1-%Pred-Post 68 %   FEV1-%Change-Post 25 %   FEV6-Pre 1.31 L   FEV6-%Pred-Pre 58 %   FEV6-Post 1.71 L   FEV6-%Pred-Post 76 %   FEV6-%Change-Post 30 %   Pre FEV1/FVC ratio 72 %   FEV1FVC-%Pred-Pre 97 %   Post FEV1/FVC ratio 70 %   FEV1FVC-%Change-Post -3 %   Pre FEV6/FVC Ratio 100 %   FEV6FVC-%Pred-Pre 106 %   Post FEV6/FVC ratio 100 %   FEV6FVC-%Pred-Post 106 %   FEF 25-75 Pre 0.65 L/sec   FEF2575-%Pred-Pre 50 %   FEF 25-75 Post 1.27 L/sec   FEF2575-%Pred-Post 98 %   FEF2575-%Change-Post 96 %   RV 2.62 L   RV % pred 114 %   TLC 4.03 L   TLC % pred 84 %   DLCO unc 10.56 ml/min/mmHg   DLCO unc % pred 60 %   DL/VA 1.61 ml/min/mmHg/L   DL/VA % pred 90 %      Assessment & Plan:   Problem List Items Addressed This Visit     Chronic right shoulder pain   Relevant Medications   citalopram (CELEXA) 40 MG tablet   celecoxib (CELEBREX) 200 MG capsule   traZODone (DESYREL) 50 MG tablet   GERD (gastroesophageal reflux disease)   Relevant Medications   esomeprazole (NEXIUM) 40 MG capsule   Insomnia   Relevant Medications   citalopram (CELEXA) 40 MG tablet   traZODone (DESYREL) 50 MG tablet   Major depressive disorder, recurrent, moderate (HCC) - Primary   Relevant Medications   citalopram (CELEXA) 40 MG tablet  traZODone (DESYREL) 50 MG tablet   Rotator cuff arthropathy, right   Relevant Medications   celecoxib (CELEBREX) 200 MG capsule   Other Visit Diagnoses     Encounter to establish care with new doctor       Chronic pain of both hips       Relevant Medications   citalopram (CELEXA) 40 MG tablet   celecoxib (CELEBREX) 200 MG capsule   traZODone (DESYREL) 50 MG tablet   Primary osteoarthritis involving multiple joints       Relevant Medications   celecoxib (CELEBREX) 200 MG capsule        Assessment and Plan    Depression On Citalopram 40mg  daily and Trazodone for sleep. Medication has been effective but patient has been off medication for a few months due to clinic closure. Reports increased depressive symptoms during this time. -Refill Cital[facility]ram 40mg  [facility] -[facility] supply to [facility] for mail order refill.  COPD Patient has been using Combivent as a rescue inhaler and has Breo and Spiriva at home but does not use them regularly. Reports wheezing. -Recommend daily use of Breo and Spiriva to improve day-to-day breathing and potentially reduce the need for Combivent. -Consider alternative maintenance inhalers if patient is not satisfied with current ones.  Arthritis Reports joint pain in hips and shoulders.  Followed by Orthopedic for injections every three months for shoulder pain. -Continue current management. Re order Celebrex  GERD On Pantoprazole 40mg  as needed. Reports infrequent use due to infrequent symptoms. -Continue Pantoprazole 40mg  as needed.  General Health Maintenance -Plan to conduct blood work at next visit in three months.        Meds ordered this encounter  Medications   DISCONTD: citalopram (CELEXA) 40 MG tablet    Sig: Take 1 tablet (40 mg total) by mouth daily.    Dispense:  30 tablet    Refill:  0   DISCONTD: traZODone (DESYREL) 50 MG tablet    Sig: Take 1 tablet (50 mg total) by mouth at bedtime as needed for sleep.    Dispense:  30 tablet    Refill:  0   esomeprazole (NEXIUM) 40 MG capsule    Sig: Take 1 capsule (40 mg total) by mouth daily before breakfast.    Dispense:  90 capsule    Refill:  3   citalopram (CELEXA) 40 MG tablet    Sig: Take 1 tablet (40 mg total) by mouth daily.    Dispense:  90 tablet    Refill:  3   celecoxib (CELEBREX) 200 MG capsule    Sig: Take 1 capsule (200 mg total) by mouth daily.    Dispense:  90 capsule    Refill:  3   traZODone (DESYREL) 50 MG  tablet    Sig: Take 1 tablet (50 mg total) by mouth at bedtime as needed for sleep.    Dispense:  90 tablet    Refill:  3      Follow up plan: Return in about 3 months (around 11/06/2023) for 3 month fasting lab only then 1 week later Follow-up COPD, Mood, Lab results.  Future labs 10/2023  Saralyn Pilar, DO [facility] 08/06/2023, 10:14 AM

## 2023-08-06 NOTE — Patient Instructions (Addendum)
Thank you for coming to the office today.  Refilled the [facility]italopram [facility] [place]  Refilled all medications 90 day with refills to the [facility] mail order.  Recommend using the Breo / Spiriva daily inhalers - let us know if questions or need new order.  Using these can reduce your combivent inhaler frequency, (orange)  DUE for FASTING BLOOD WORK (no food or drink after midnight before the lab appointment, only water or coffee without cream/sugar on the morning of)  SCHEDULE "Lab Only" visit in the morning at the clinic for lab draw in 3 MONTHS   - Make sure Lab Only appointment is at about 1 week before your next appointment, so that results will be available  For Lab Results, once available within 2-3 days of blood draw, you can can log in to MyChart online to view your results and a brief explanation. Also, we can discuss results at next follow-up visit.   Please schedule a Follow-up Appointment to: Return in about 3 months (around 11/06/2023) for 3 month fasting lab only then 1 week later Follow-up COPD, Mood, Lab results.  If you have any other questions or concerns, please feel free to call the office or send a message through MyChart. You may also schedule an earlier appointment if necessary.  Additionally, you may be receiving a survey about your experience at our office within a few days to 1 week by e-mail or mail. We value your feedback.  Saralyn Pilar, DO [facility], [place]

## 2023-08-07 ENCOUNTER — Telehealth: Payer: Self-pay | Admitting: Family Medicine

## 2023-08-07 NOTE — Telephone Encounter (Signed)
Medication Refill - Medication: ibuprofen (ADVIL) 800 MG tablet [1610   Has the patient contacted their pharmacy? No. (   Preferred Pharmacy (with phone number or street name):  [facility] - [place], [place] - [address] [facility]  [address] [place] [address]  Phone: [phone] Fax: [phone]  Hours: Not open 24 hours     Has the patient been seen for an appointment in the last year OR does the patient have an upcoming appointment? Yes.    Agent: Please be advised that RX refills may take up to 3 business days. We ask that you follow-up with your pharmacy.

## 2023-08-07 NOTE — Telephone Encounter (Signed)
Please notify patient that I would not be able to prescribe ibuprofen 800 + the Celebrex.  At her visit 10/28, we ordered Celebrex to [facility].  These medicines are duplicate since they are both NSAIDs anti inflammatories.  Let me know if she has more information for me or questions  Jennifer Pilar, DO [facility] 08/07/2023, 5:43 PM

## 2023-08-08 NOTE — Telephone Encounter (Signed)
Patient notified. She had forgotten that Dr. Kirtland Bouchard ordered the Celebrex. She is appreciative of phone call.

## 2023-09-02 ENCOUNTER — Other Ambulatory Visit: Payer: Self-pay | Admitting: Family Medicine

## 2023-09-02 DIAGNOSIS — F5101 Primary insomnia: Secondary | ICD-10-CM

## 2023-09-02 DIAGNOSIS — F331 Major depressive disorder, recurrent, moderate: Secondary | ICD-10-CM

## 2023-09-04 NOTE — Telephone Encounter (Signed)
#  30 [facility]  Requested Prescriptions  Pending Prescriptions Disp Refills   traZODone (DESYREL) 50 MG tablet [Pharmacy Med Name: TRAZODONE 50 MG TABLET] 30 tablet 0    Sig: TAKE 1 TABLET BY MOUTH AT BEDTIME AS NEEDED FOR SLEEP.     Psychiatry: Antidepressants - Serotonin Modulator Passed - 09/02/2023  1:08 AM      Passed - Completed PHQ-2 or PHQ-9 in the last 360 days      Passed - Valid encounter within last 6 months    Recent Outpatient Visits           4 weeks ago Major depressive disorder, recurrent, moderate (HCC)   [place] [facility] [place], Netta Neat, DO       Future Appointments             In 2 months Althea Charon, Netta Neat, DO [place] [facility]             citalopram (CELEXA) 40 MG tablet [Pharmacy Med Name: CITALOPRAM HBR 40 MG TABLET] 30 tablet 0    Sig: TAKE 1 TABLET BY MOUTH EVERY DAY     Psychiatry:  Antidepressants - SSRI Passed - 09/02/2023  1:08 AM      Passed - Completed PHQ-2 or PHQ-9 in the last 360 days      Passed - Valid encounter within last 6 months    Recent Outpatient Visits           4 weeks ago Major depressive disorder, recurrent, moderate (HCC)   [place] [facility] [place], Netta Neat, DO       Future Appointments             In 2 months Althea Charon, Netta Neat, DO [place] [facility], [facility]

## 2023-09-26 ENCOUNTER — Telehealth: Payer: Self-pay | Admitting: Family Medicine

## 2023-09-26 NOTE — Telephone Encounter (Signed)
Called LVM 09/26/2023 to schedule Annual Wellness Visit. Please schedule office or virtual visits.  Verlee Rossetti; Care Guide Ambulatory Clinical Support [place] [facility] Direct Dial: [phone]

## 2023-10-30 ENCOUNTER — Other Ambulatory Visit: Payer: Medicare Other

## 2023-10-30 DIAGNOSIS — M150 Primary generalized (osteo)arthritis: Secondary | ICD-10-CM

## 2023-10-30 DIAGNOSIS — R7309 Other abnormal glucose: Secondary | ICD-10-CM

## 2023-10-30 DIAGNOSIS — J431 Panlobular emphysema: Secondary | ICD-10-CM

## 2023-10-30 DIAGNOSIS — Z0000 Encounter for general adult medical examination without abnormal findings: Secondary | ICD-10-CM

## 2023-10-30 DIAGNOSIS — F331 Major depressive disorder, recurrent, moderate: Secondary | ICD-10-CM

## 2023-10-30 DIAGNOSIS — E538 Deficiency of other specified B group vitamins: Secondary | ICD-10-CM

## 2023-10-30 DIAGNOSIS — E782 Mixed hyperlipidemia: Secondary | ICD-10-CM

## 2023-11-05 ENCOUNTER — Other Ambulatory Visit: Payer: Self-pay | Admitting: Family Medicine

## 2023-11-05 DIAGNOSIS — R7309 Other abnormal glucose: Secondary | ICD-10-CM

## 2023-11-05 DIAGNOSIS — E538 Deficiency of other specified B group vitamins: Secondary | ICD-10-CM

## 2023-11-05 DIAGNOSIS — E782 Mixed hyperlipidemia: Secondary | ICD-10-CM

## 2023-11-05 DIAGNOSIS — Z0000 Encounter for general adult medical examination without abnormal findings: Secondary | ICD-10-CM

## 2023-11-05 DIAGNOSIS — F331 Major depressive disorder, recurrent, moderate: Secondary | ICD-10-CM

## 2023-11-06 ENCOUNTER — Other Ambulatory Visit: Payer: Medicare Other

## 2023-11-06 ENCOUNTER — Ambulatory Visit: Payer: Medicare Other | Admitting: Family Medicine

## 2023-11-09 LAB — COMPLETE METABOLIC PANEL WITH GFR
AG Ratio: 1.8 (calc) (ref 1.0–2.5)
ALT: 9 U/L (ref 6–29)
AST: 8 U/L — ABNORMAL LOW (ref 10–35)
Albumin: 4.1 g/dL (ref 3.6–5.1)
Alkaline phosphatase (APISO): 78 U/L (ref 37–153)
BUN/Creatinine Ratio: 26 (calc) — ABNORMAL HIGH (ref 6–22)
BUN: 15 mg/dL (ref 7–25)
CO2: 31 mmol/L (ref 20–32)
Calcium: 9.5 mg/dL (ref 8.6–10.4)
Chloride: 101 mmol/L (ref 98–110)
Creat: 0.58 mg/dL — ABNORMAL LOW (ref 0.60–0.95)
Globulin: 2.3 g/dL (ref 1.9–3.7)
Glucose, Bld: 98 mg/dL (ref 65–99)
Potassium: 4.2 mmol/L (ref 3.5–5.3)
Sodium: 141 mmol/L (ref 135–146)
Total Bilirubin: 0.7 mg/dL (ref 0.2–1.2)
Total Protein: 6.4 g/dL (ref 6.1–8.1)
eGFR: 91 mL/min/{1.73_m2} (ref >=60)

## 2023-11-09 LAB — HEMOGLOBIN A1C
Hgb A1c MFr Bld: 5.7 %{Hb} — ABNORMAL HIGH (ref <5.7)
Mean Plasma Glucose: 117 mg/dL
eAG (mmol/L): 6.5 mmol/L

## 2023-11-09 LAB — VITAMIN B12: Vitamin B-12: 325 pg/mL (ref 200–1100)

## 2023-11-09 LAB — CBC WITH DIFFERENTIAL/PLATELET
Absolute Lymphocytes: 2148 {cells}/uL (ref 850–3900)
Absolute Monocytes: 462 {cells}/uL (ref 200–950)
Basophils Absolute: 69 {cells}/uL (ref 0–200)
Basophils Relative: 0.9 %
Eosinophils Absolute: 154 {cells}/uL (ref 15–500)
Eosinophils Relative: 2 %
HCT: 43.1 % (ref 35.0–45.0)
Hemoglobin: 14 g/dL (ref 11.7–15.5)
MCH: 29.9 pg (ref 27.0–33.0)
MCHC: 32.5 g/dL (ref 32.0–36.0)
MCV: 92.1 fL (ref 80.0–100.0)
MPV: 10.8 fL (ref 7.5–12.5)
Monocytes Relative: 6 %
Neutro Abs: 4866 {cells}/uL (ref 1500–7800)
Neutrophils Relative %: 63.2 %
Platelets: 232 10*3/uL (ref 140–400)
RBC: 4.68 10*6/uL (ref 3.80–5.10)
RDW: 12.6 % (ref 11.0–15.0)
Total Lymphocyte: 27.9 %
WBC: 7.7 10*3/uL (ref 3.8–10.8)

## 2023-11-09 LAB — LIPID PANEL
Cholesterol: 226 mg/dL — ABNORMAL HIGH (ref <200)
HDL: 38 mg/dL — ABNORMAL LOW (ref >=50)
LDL Cholesterol (Calc): 154 mg/dL — ABNORMAL HIGH
Non-HDL Cholesterol (Calc): 188 mg/dL — ABNORMAL HIGH (ref <130)
Total CHOL/HDL Ratio: 5.9 (calc) — ABNORMAL HIGH (ref <5.0)
Triglycerides: 197 mg/dL — ABNORMAL HIGH (ref <150)

## 2023-11-09 LAB — TSH: TSH: 2.66 m[IU]/L (ref 0.40–4.50)

## 2023-11-19 ENCOUNTER — Other Ambulatory Visit: Payer: Self-pay | Admitting: Family Medicine

## 2023-11-19 ENCOUNTER — Ambulatory Visit: Payer: Medicare Other | Admitting: Family Medicine

## 2023-11-19 ENCOUNTER — Encounter: Payer: Self-pay | Admitting: Family Medicine

## 2023-11-19 VITALS — BP 122/78 | HR 80 | Ht 62.0 in | Wt 165.0 lb

## 2023-11-19 DIAGNOSIS — E538 Deficiency of other specified B group vitamins: Secondary | ICD-10-CM

## 2023-11-19 DIAGNOSIS — E782 Mixed hyperlipidemia: Secondary | ICD-10-CM

## 2023-11-19 DIAGNOSIS — M25551 Pain in right hip: Secondary | ICD-10-CM

## 2023-11-19 DIAGNOSIS — G8929 Other chronic pain: Secondary | ICD-10-CM

## 2023-11-19 DIAGNOSIS — J431 Panlobular emphysema: Secondary | ICD-10-CM

## 2023-11-19 DIAGNOSIS — F5101 Primary insomnia: Secondary | ICD-10-CM

## 2023-11-19 DIAGNOSIS — M25511 Pain in right shoulder: Secondary | ICD-10-CM

## 2023-11-19 DIAGNOSIS — F331 Major depressive disorder, recurrent, moderate: Secondary | ICD-10-CM

## 2023-11-19 DIAGNOSIS — M25552 Pain in left hip: Secondary | ICD-10-CM

## 2023-11-19 DIAGNOSIS — M150 Primary generalized (osteo)arthritis: Secondary | ICD-10-CM

## 2023-11-19 DIAGNOSIS — R7309 Other abnormal glucose: Secondary | ICD-10-CM

## 2023-11-19 DIAGNOSIS — M12811 Other specific arthropathies, not elsewhere classified, right shoulder: Secondary | ICD-10-CM

## 2023-11-19 MED ORDER — TRAZODONE HCL 50 MG PO TABS: 50.0000 mg | ORAL_TABLET | Freq: Every evening | ORAL | 1 refills | Status: AC | PRN

## 2023-11-19 MED ORDER — CITALOPRAM HYDROBROMIDE 40 MG PO TABS: 40.0000 mg | ORAL_TABLET | Freq: Every day | ORAL | 3 refills | Status: AC

## 2023-11-19 MED ORDER — VITAMIN B-12 1000 MCG PO TABS: 1000.0000 ug | ORAL_TABLET | Freq: Every day | ORAL | 3 refills | Status: DC

## 2023-11-19 MED ORDER — ROSUVASTATIN CALCIUM 10 MG PO TABS: 10.0000 mg | ORAL_TABLET | Freq: Every day | ORAL | 3 refills | Status: DC

## 2023-11-19 MED ORDER — CELECOXIB 200 MG PO CAPS: 200.0000 mg | ORAL_CAPSULE | Freq: Every day | ORAL | 3 refills | Status: AC

## 2023-11-19 NOTE — Progress Notes (Signed)
 Subjective:    Patient ID: Jennifer Hayes, female    DOB: 01/10/1941, 83 y.o.   MRN: [id number]  Jennifer Hayes is a 83 y.o. female presenting on 11/19/2023 for Hyperlipidemia   HPI  Discussed the use of AI scribe software for clinical note transcription with the patient, who gave verbal consent to proceed.  History of Present Illness   Jennifer Hayes is an 83 year old female who presents for follow-up of lab test results and medication management.  She is here for a follow-up on her recent lab test results. Her vitamin B12 and thyroid hormone levels are within normal ranges. Kidney and liver functions are stable and normal, and she is not anemic.  Her LDL cholesterol has increased from previous levels of 100-130 mg/dL to 409 mg/dL. She has not been on any cholesterol medication before.  Her A1c level has increased slightly compared to four years ago, now at 5.7%, indicating early prediabetes, but she is not currently diagnosed with diabetes.  A CT scan of the chest from a year and a half ago showed some calcification in the coronary arteries, indicating some buildup, but no major blockages were noted.  She takes over-the-counter vitamin B12 supplements and has been on citalopram  daily, which was restarted in a previous visit. She also uses trazodone  for sleep and has Celebrex  for arthritis, which she takes once daily. She receives injections for arthritis in her Shoulder every three months at [facility].  She experiences some wheezing in her lungs, which she notes as normal for her.  She reports a slight weight loss, which was intentional.         08/06/2023   11:28 PM 04/28/2022   10:05 AM 03/24/2022   11:30 AM  Depression screen PHQ 2/9  Decreased Interest  0 0  Down, Depressed, Hopeless 2 0 0  PHQ - 2 Score 2 0 0  Altered sleeping 1 0 3  Tired, decreased energy 1 0 0  Change in appetite 1 0 0  Feeling bad or failure about yourself  1  0 0  Trouble concentrating  0 0  Moving slowly or fidgety/restless 1 0 0  Suicidal thoughts 0 0 0  PHQ-9 Score 7 0 3  Difficult doing work/chores Very difficult  Not difficult at all       10/31/2021   10:42 AM  GAD 7 : Generalized Anxiety Score  Nervous, Anxious, on Edge 0  Control/stop worrying 0  Worry too much - different things 0  Trouble relaxing 0  Restless 0  Easily annoyed or irritable 0  Afraid - awful might happen 0  Total GAD 7 Score 0    Social History   Tobacco Use   Smoking status: Every Day    Current packs/day: 1.00    Average packs/day: 1 pack/day for 50.0 years (50.0 ttl pk-yrs)    Types: Cigarettes   Smokeless tobacco: Never  Vaping Use   Vaping status: Some Days  Substance Use Topics   Alcohol use: Not Currently    Comment: occasionally   Drug use: Never    Review of Systems Per HPI unless specifically indicated above     Objective:    BP 122/78   Pulse 80   Ht 5\' 2"  (1.575 m)   Wt 165 lb (74.8 kg)   SpO2 94%   BMI 30.18 kg/m   Wt Readings from Last 3 Encounters:  11/19/23 165 lb (74.8 kg)  08/06/23 176 lb  3.2 oz (79.9 kg)  06/18/23 178 lb 9.6 oz (81 kg)    Physical Exam Vitals and nursing note reviewed.  Constitutional:      General: She is not in acute distress.    Appearance: She is well-developed. She is not diaphoretic.     Comments: Well-appearing, comfortable, cooperative  HENT:     Head: Normocephalic and atraumatic.  Eyes:     General:        Right eye: No discharge.        Left eye: No discharge.     Conjunctiva/sclera: Conjunctivae normal.  Neck:     Thyroid: No thyromegaly.  Cardiovascular:     Rate and Rhythm: Normal rate and regular rhythm.     Heart sounds: Normal heart sounds. No murmur heard. Pulmonary:     Effort: Pulmonary effort is normal. No respiratory distress.     Breath sounds: Normal breath sounds. No wheezing or rales.  Musculoskeletal:        General: Normal range of motion.     Cervical  back: Normal range of motion and neck supple.  Lymphadenopathy:     Cervical: No cervical adenopathy.  Skin:    General: Skin is warm and dry.     Findings: No erythema or rash.  Neurological:     Mental Status: She is alert and oriented to person, place, and time.  Psychiatric:        Behavior: Behavior normal.     Comments: Well groomed, good eye contact, normal speech and thoughts      CLINICAL DATA:  Cough and congestion for 2 days.   EXAM: CT ANGIOGRAPHY CHEST WITH CONTRAST   TECHNIQUE: Multidetector CT imaging of the chest was performed using the standard protocol during bolus administration of intravenous contrast. Multiplanar CT image reconstructions and MIPs were obtained to evaluate the vascular anatomy.   RADIATION DOSE REDUCTION: This exam was performed according to the departmental dose-optimization program which includes automated exposure control, adjustment of the mA and/or kV according to patient size and/or use of iterative reconstruction technique.   CONTRAST:  75mL OMNIPAQUE  IOHEXOL  350 MG/ML SOLN   COMPARISON:  One-view chest x-ray 08/18/2022   FINDINGS: Cardiovascular: Heart size is normal. Coronary artery calcifications are present. No significant pericardial effusion is present.   Atherosclerotic calcifications are present at the aortic arch and great vessel origins without focal stenosis of greater than 50%.   Pulmonary artery opacification is excellent. No focal filling defects are present to suggest pulmonary emboli. Pulmonary artery size is normal.   Mediastinum/Nodes: No enlarged mediastinal, hilar, or axillary lymph nodes. Thyroid gland, trachea, and esophagus demonstrate no significant findings.   Lungs/Pleura: Patchy areas of ground-glass attenuation are present throughout the right upper lobe. This includes the area of concern on the chest x-ray. No nodule or mass lesion is present. Right middle lobe and lower lobe are clear. Left  lung is clear. No significant pleural disease is present.   Upper Abdomen: A small hiatal hernia is present. Visualized upper abdomen is otherwise unremarkable.   Musculoskeletal: Remote compression fractures are present at T8, T10 and T11. No acute fractures are present. No focal osseous lesions are present.   Review of the MIP images confirms the above findings.   IMPRESSION: 1. No pulmonary embolus. 2. Patchy areas of ground-glass attenuation throughout the right upper lobe compatible with pneumonia. No underlying nodule or mass lesion. No further imaging follow-up recommended. 3. Coronary artery disease. 4. Small hiatal hernia. 5. Remote  compression fractures at T8, T10 and T11. 6.  Aortic Atherosclerosis (ICD10-I70.0).     Electronically Signed   By: Audree Leas M.D.   On: 08/18/2022 07:00  Results for orders placed or performed in visit on 11/05/23  TSH   Collection Time: 11/06/23 10:36 AM  Result Value Ref Range   TSH 2.66 0.40 - 4.50 mIU/L  Lipid panel   Collection Time: 11/06/23 10:36 AM  Result Value Ref Range   Cholesterol 226 (H) <200 mg/dL   HDL 38 (L) > OR = 50 mg/dL   Triglycerides 308 (H) <150 mg/dL   LDL Cholesterol (Calc) 154 (H) mg/dL (calc)   Total CHOL/HDL Ratio 5.9 (H) <5.0 (calc)   Non-HDL Cholesterol (Calc) 188 (H) <130 mg/dL (calc)  Hemoglobin M5H   Collection Time: 11/06/23 10:36 AM  Result Value Ref Range   Hgb A1c MFr Bld 5.7 (H) <5.7 % of total Hgb   Mean Plasma Glucose 117 mg/dL   eAG (mmol/L) 6.5 mmol/L  COMPLETE METABOLIC PANEL WITH GFR   Collection Time: 11/06/23 10:36 AM  Result Value Ref Range   Glucose, Bld 98 65 - 99 mg/dL   BUN 15 7 - 25 mg/dL   Creat 8.46 (L) 9.62 - 0.95 mg/dL   eGFR 91 > OR = 60 XB/MWU/1.32G4   BUN/Creatinine Ratio 26 (H) 6 - 22 (calc)   Sodium 141 135 - 146 mmol/L   Potassium 4.2 3.5 - 5.3 mmol/L   Chloride 101 98 - 110 mmol/L   CO2 31 20 - 32 mmol/L   Calcium  9.5 8.6 - 10.4 mg/dL   Total  Protein 6.4 6.1 - 8.1 g/dL   Albumin 4.1 3.6 - 5.1 g/dL   Globulin 2.3 1.9 - 3.7 g/dL (calc)   AG Ratio 1.8 1.0 - 2.5 (calc)   Total Bilirubin 0.7 0.2 - 1.2 mg/dL   Alkaline phosphatase (APISO) 78 37 - 153 U/L   AST 8 (L) 10 - 35 U/L   ALT 9 6 - 29 U/L  CBC with Differential/Platelet   Collection Time: 11/06/23 10:36 AM  Result Value Ref Range   WBC 7.7 3.8 - 10.8 Thousand/uL   RBC 4.68 3.80 - 5.10 Million/uL   Hemoglobin 14.0 11.7 - 15.5 g/dL   HCT 01.0 27.2 - 53.6 %   MCV 92.1 80.0 - 100.0 fL   MCH 29.9 27.0 - 33.0 pg   MCHC 32.5 32.0 - 36.0 g/dL   RDW 64.4 03.4 - 74.2 %   Platelets 232 140 - 400 Thousand/uL   MPV 10.8 7.5 - 12.5 fL   Neutro Abs 4,866 1,500 - 7,800 cells/uL   Absolute Lymphocytes 2,148 850 - 3,900 cells/uL   Absolute Monocytes 462 200 - 950 cells/uL   Eosinophils Absolute 154 15 - 500 cells/uL   Basophils Absolute 69 0 - 200 cells/uL   Neutrophils Relative % 63.2 %   Total Lymphocyte 27.9 %   Monocytes Relative 6.0 %   Eosinophils Relative 2.0 %   Basophils Relative 0.9 %  Vitamin B12   Collection Time: 11/06/23 10:36 AM  Result Value Ref Range   Vitamin B-12 325 200 - 1,100 pg/mL      Assessment & Plan:   Problem List Items Addressed This Visit     Chronic right shoulder pain   Relevant Medications   citalopram  (CELEXA ) 40 MG tablet   traZODone  (DESYREL ) 50 MG tablet   celecoxib  (CELEBREX ) 200 MG capsule   Hyperlipidemia   Relevant Medications  rosuvastatin  (CRESTOR ) 10 MG tablet   Insomnia   Relevant Medications   citalopram  (CELEXA ) 40 MG tablet   traZODone  (DESYREL ) 50 MG tablet   Major depressive disorder, recurrent, moderate (HCC) - Primary   Relevant Medications   citalopram  (CELEXA ) 40 MG tablet   traZODone  (DESYREL ) 50 MG tablet   Rotator cuff arthropathy, right   Relevant Medications   celecoxib  (CELEBREX ) 200 MG capsule   Vitamin B12 deficiency   Relevant Medications   cyanocobalamin  (VITAMIN B12) 1000 MCG tablet   Other  Visit Diagnoses       Chronic pain of both hips       Relevant Medications   citalopram  (CELEXA ) 40 MG tablet   traZODone  (DESYREL ) 50 MG tablet   celecoxib  (CELEBREX ) 200 MG capsule     Primary osteoarthritis involving multiple joints       Relevant Medications   celecoxib  (CELEBREX ) 200 MG capsule        Hyperlipidemia LDL increased to 150 from previous range of 100-130. Discussed the risks/benefits of starting a cholesterol medication given the presence of calcifications in the coronary arteries on prior CT scan. -Start a Rosuvastatin  10mg  nightly Check labs in 3 months  Prediabetes A1c mildly elevated at 5.7, compared to four years ago. No current concerns for diabetes. -Monitor A1c levels.  Vitamin B12 Normal levels on current over-the-counter supplementation. -Attempt to order B12 through insurance.  Citalopram  and Trazodone  Current prescriptions for mental health and sleep, respectively. -Refill prescriptions for 90 days, to be sent to [facility].  Celebrex  Current prescription for arthritis management. -Add refills to current prescription.  General Health Maintenance -Return for fasting labs in three months on Monday, May 12th at 9 AM to recheck cholesterol levels.         No orders of the defined types were placed in this encounter.   Meds ordered this encounter  Medications   cyanocobalamin  (VITAMIN B12) 1000 MCG tablet    Sig: Take 1 tablet (1,000 mcg total) by mouth daily.    Dispense:  90 tablet    Refill:  3   citalopram  (CELEXA ) 40 MG tablet    Sig: Take 1 tablet (40 mg total) by mouth daily.    Dispense:  90 tablet    Refill:  3   traZODone  (DESYREL ) 50 MG tablet    Sig: Take 1 tablet (50 mg total) by mouth at bedtime as needed. for sleep    Dispense:  90 tablet    Refill:  1   celecoxib  (CELEBREX ) 200 MG capsule    Sig: Take 1 capsule (200 mg total) by mouth daily.    Dispense:  90 capsule    Refill:  3    Add refills   rosuvastatin   (CRESTOR ) 10 MG tablet    Sig: Take 1 tablet (10 mg total) by mouth at bedtime.    Dispense:  90 tablet    Refill:  3    Follow up plan: Return in about 3 months (around 02/16/2024) for 3 month fasting lab only - no apt w/ me..  Future labs ordered for 5.12.25   Domingo Friend, DO [facility] 11/19/2023, 10:57 AM

## 2023-11-19 NOTE — Patient Instructions (Addendum)
 Thank you for coming to the office today.  Start new cholesterol medication  DUE for FASTING BLOOD WORK (no food or drink after midnight before the lab appointment, only water  or coffee without cream/sugar on the morning of)  SCHEDULE "Lab Only" visit in the morning at the clinic for lab draw in 3 MONTHS   - Make sure Lab Only appointment is at about 1 week before your next appointment, so that results will be available  For Lab Results, once available within 2-3 days of blood draw, you can can log in to MyChart online to view your results and a brief explanation. Also, we can discuss results at next follow-up visit.   Please schedule a Follow-up Appointment to: Return in about 3 months (around 02/16/2024) for 3 month fasting lab only - no apt w/ me..  If you have any other questions or concerns, please feel free to call the office or send a message through MyChart. You may also schedule an earlier appointment if necessary.  Additionally, you may be receiving a survey about your experience at our office within a few days to 1 week by e-mail or mail. We value your feedback.  Domingo Friend, DO [facility], [place]

## 2023-11-23 ENCOUNTER — Telehealth: Payer: Self-pay

## 2023-11-23 DIAGNOSIS — H9193 Unspecified hearing loss, bilateral: Secondary | ICD-10-CM

## 2023-11-23 NOTE — Telephone Encounter (Signed)
Copied from CRM [id number]. Topic: Referral - Request for Referral >> Nov 23, 2023  9:38 AM Dondra Prader A wrote: Reason for CRM: Referral  Did the patient discuss referral with their provider in the last year? Yes (If No - schedule appointment) (If Yes - send message)  Appointment offered? No, pt daughter in law states she just seen PCP on 11/19/2023, do not want to schedule an appt for referral  Type of order/referral and detailed reason for visit: hearing aids  Preference of office, provider, location: [place] area  If referral order, have you been seen by this specialty before? No (If Yes, this issue or another issue? When? Where?  Can we respond through MyChart? No

## 2023-11-23 NOTE — Telephone Encounter (Signed)
Sent Referral to [facility] for Audiology eval and hearing aids.  Saralyn Pilar, DO [facility] 11/23/2023, 10:46 AM

## 2023-11-23 NOTE — Telephone Encounter (Signed)
Left message for patient to return call OK for [facility] to advise

## 2023-11-23 NOTE — Telephone Encounter (Signed)
Called, spoke with patient. Advised her of provider's message about referral. Patient verbal understanding.

## 2023-11-26 ENCOUNTER — Encounter: Payer: Self-pay | Admitting: Family Medicine

## 2023-11-26 ENCOUNTER — Ambulatory Visit: Payer: Medicare Other | Admitting: Family Medicine

## 2023-11-26 VITALS — BP 130/84 | HR 83 | Ht 62.0 in | Wt 164.0 lb

## 2023-11-26 DIAGNOSIS — I700 Atherosclerosis of aorta: Secondary | ICD-10-CM | POA: Diagnosis not present

## 2023-11-26 DIAGNOSIS — Z8673 Personal history of transient ischemic attack (TIA), and cerebral infarction without residual deficits: Secondary | ICD-10-CM

## 2023-11-26 DIAGNOSIS — I5032 Chronic diastolic (congestive) heart failure: Secondary | ICD-10-CM

## 2023-11-26 DIAGNOSIS — E66811 Obesity, class 1: Secondary | ICD-10-CM | POA: Diagnosis not present

## 2023-11-26 MED ORDER — WEGOVY 0.25 MG/0.5ML ~~LOC~~ SOAJ: 0.2500 mg | SUBCUTANEOUS | 0 refills | Status: DC

## 2023-11-26 NOTE — Patient Instructions (Addendum)

## 2023-11-26 NOTE — Progress Notes (Signed)
Subjective:    Patient ID: Jennifer Hayes, female    DOB: 11/07/1940, 83 y.o.   MRN: [id number]  Jennifer Hayes is a 83 y.o. female presenting on 11/26/2023 for Obesity   HPI  Discussed the use of AI scribe software for clinical note transcription with the patient, who gave verbal consent to proceed.  History of Present Illness    Jennifer Hayes is an 83 year old female who presents for weight management.  She is interested in weight loss injections and has not previously used weight loss medications. Her BMI is 30, classifying her as obese. She is concerned about her ability to lose weight due to her age and limitations, worse weight gain after smoking status changed quit and then resumed. She has attempted diet and exercise >6 months without adequate results.  She experiences difficulty with mobility due to back pain, which she attributes to weight gain. A rod in her back exacerbates her discomfort when walking.  She has a history of weight fluctuations and has lost weight from 176 pounds to 164 pounds since October. She is concerned about regaining weight after resuming smoking, which she has since quit again.  History of TIA Atherosclerosis, history reviewed Diastolic Congestive Heart Failure       08/06/2023   11:28 PM 04/28/2022   10:05 AM 03/24/2022   11:30 AM  Depression screen PHQ 2/9  Decreased Interest  0 0  Down, Depressed, Hopeless 2 0 0  PHQ - 2 Score 2 0 0  Altered sleeping 1 0 3  Tired, decreased energy 1 0 0  Change in appetite 1 0 0  Feeling bad or failure about yourself  1 0 0  Trouble concentrating  0 0  Moving slowly or fidgety/restless 1 0 0  Suicidal thoughts 0 0 0  PHQ-9 Score 7 0 3  Difficult doing work/chores Very difficult  Not difficult at all       10/31/2021   10:42 AM  GAD 7 : Generalized Anxiety Score  Nervous, Anxious, on Edge 0  Control/stop worrying 0  Worry too much - different things 0  Trouble  relaxing 0  Restless 0  Easily annoyed or irritable 0  Afraid - awful might happen 0  Total GAD 7 Score 0    Social History   Tobacco Use   Smoking status: Every Day    Current packs/day: 1.00    Average packs/day: 1 pack/day for 50.0 years (50.0 ttl pk-yrs)    Types: Cigarettes   Smokeless tobacco: Never  Vaping Use   Vaping status: Some Days  Substance Use Topics   Alcohol use: Not Currently    Comment: occasionally   Drug use: Never    Review of Systems Per HPI unless specifically indicated above     Objective:    BP 130/84   Pulse 83   Ht 5\' 2"  (1.575 m)   Wt 164 lb (74.4 kg)   SpO2 94%   BMI 30.00 kg/m   Wt Readings from Last 3 Encounters:  11/26/23 164 lb (74.4 kg)  11/19/23 165 lb (74.8 kg)  08/06/23 176 lb 3.2 oz (79.9 kg)    Physical Exam Vitals and nursing note reviewed.  Constitutional:      General: She is not in acute distress.    Appearance: She is well-developed. She is obese. She is not diaphoretic.     Comments: Well-appearing, comfortable, cooperative  HENT:     Head: Normocephalic and atraumatic.  Eyes:     General:        Right eye: No discharge.        Left eye: No discharge.     Conjunctiva/sclera: Conjunctivae normal.  Neck:     Thyroid: No thyromegaly.  Cardiovascular:     Rate and Rhythm: Normal rate and regular rhythm.     Heart sounds: Normal heart sounds. No murmur heard. Pulmonary:     Effort: Pulmonary effort is normal. No respiratory distress.     Breath sounds: Normal breath sounds. No wheezing or rales.  Musculoskeletal:        General: Normal range of motion.     Cervical back: Normal range of motion and neck supple.  Lymphadenopathy:     Cervical: No cervical adenopathy.  Skin:    General: Skin is warm and dry.     Findings: No erythema or rash.  Neurological:     Mental Status: She is alert and oriented to person, place, and time.  Psychiatric:        Behavior: Behavior normal.     Comments: Well groomed,  good eye contact, normal speech and thoughts     Results for orders placed or performed in visit on 11/05/23  TSH   Collection Time: 11/06/23 10:36 AM  Result Value Ref Range   TSH 2.66 0.40 - 4.50 mIU/L  Lipid panel   Collection Time: 11/06/23 10:36 AM  Result Value Ref Range   Cholesterol 226 (H) <200 mg/dL   HDL 38 (L) > OR = 50 mg/dL   Triglycerides 161 (H) <150 mg/dL   LDL Cholesterol (Calc) 154 (H) mg/dL (calc)   Total CHOL/HDL Ratio 5.9 (H) <5.0 (calc)   Non-HDL Cholesterol (Calc) 188 (H) <130 mg/dL (calc)  Hemoglobin W9U   Collection Time: 11/06/23 10:36 AM  Result Value Ref Range   Hgb A1c MFr Bld 5.7 (H) <5.7 % of total Hgb   Mean Plasma Glucose 117 mg/dL   eAG (mmol/L) 6.5 mmol/L  COMPLETE METABOLIC PANEL WITH GFR   Collection Time: 11/06/23 10:36 AM  Result Value Ref Range   Glucose, Bld 98 65 - 99 mg/dL   BUN 15 7 - 25 mg/dL   Creat 0.45 (L) 4.09 - 0.95 mg/dL   eGFR 91 > OR = 60 WJ/XBJ/4.78G9   BUN/Creatinine Ratio 26 (H) 6 - 22 (calc)   Sodium 141 135 - 146 mmol/L   Potassium 4.2 3.5 - 5.3 mmol/L   Chloride 101 98 - 110 mmol/L   CO2 31 20 - 32 mmol/L   Calcium 9.5 8.6 - 10.4 mg/dL   Total Protein 6.4 6.1 - 8.1 g/dL   Albumin 4.1 3.6 - 5.1 g/dL   Globulin 2.3 1.9 - 3.7 g/dL (calc)   AG Ratio 1.8 1.0 - 2.5 (calc)   Total Bilirubin 0.7 0.2 - 1.2 mg/dL   Alkaline phosphatase (APISO) 78 37 - 153 U/L   AST 8 (L) 10 - 35 U/L   ALT 9 6 - 29 U/L  CBC with Differential/Platelet   Collection Time: 11/06/23 10:36 AM  Result Value Ref Range   WBC 7.7 3.8 - 10.8 Thousand/uL   RBC 4.68 3.80 - 5.10 Million/uL   Hemoglobin 14.0 11.7 - 15.5 g/dL   HCT 56.2 13.0 - 86.5 %   MCV 92.1 80.0 - 100.0 fL   MCH 29.9 27.0 - 33.0 pg   MCHC 32.5 32.0 - 36.0 g/dL   RDW 78.4 69.6 - 29.5 %   Platelets 232  140 - 400 Thousand/uL   MPV 10.8 7.5 - 12.5 fL   Neutro Abs 4,866 1,500 - 7,800 cells/uL   Absolute Lymphocytes 2,148 850 - 3,900 cells/uL   Absolute Monocytes 462 200 - 950  cells/uL   Eosinophils Absolute 154 15 - 500 cells/uL   Basophils Absolute 69 0 - 200 cells/uL   Neutrophils Relative % 63.2 %   Total Lymphocyte 27.9 %   Monocytes Relative 6.0 %   Eosinophils Relative 2.0 %   Basophils Relative 0.9 %  Vitamin B12   Collection Time: 11/06/23 10:36 AM  Result Value Ref Range   Vitamin B-12 325 200 - 1,100 pg/mL      Assessment & Plan:   Problem List Items Addressed This Visit     Chronic diastolic CHF (congestive heart failure) (HCC)   Relevant Medications   WEGOVY 0.25 MG/0.5ML SOAJ   History of TIA (transient ischemic attack)   Other Visit Diagnoses       Aortic atherosclerosis (HCC)    -  Primary   Relevant Medications   WEGOVY 0.25 MG/0.5ML SOAJ     Obesity (BMI 30.0-34.9)       Relevant Medications   WEGOVY 0.25 MG/0.5ML SOAJ         Obesity Cardiovascular Risk Factors - CHF, Aortic Atherosclerosis, History of TIA BMI of 30 with recent weight gain. Patient has difficulty with physical activity due to back pain from a rod in the back. Discussed the use of Wegovy for weight loss. Patient has not previously used weight loss medications.  Emphasis on goal for Reginal Lutes is primarily for cardiovascular risk reduction in setting of obesity. Start therapy 0.25mg  weekly for 4 weeks then dose increase if tolerated -Review potential side effects including decreased appetite, nausea, and upset stomach. -Follow-up in three months to assess efficacy and tolerability of Wegovy.  Smoking Cessation Patient recently quit smoking but had a period of relapse associated with weight gain. -Encourage continued abstinence from smoking.   Follow-up in three months to assess response to [facility] and overall health status.       ____________________________________________________ Additional Rx Information (May be used for Prior Authorization if required)  Medication name and Strength: Wegovy 0.25 mg  Primary Diagnosis and ICD10 Code: Obesity BMI >30  (Z61.096) Secondary Diagnosis and ICD10 Code: Aortic Atherosclerosis I70, History TIA Z86.73, Chronic Diastolic Congestive Heart Failure  (I50.32) Previous Failed Medications None Quantity and Duration of New Medication: 2 mL 30 days Additional Supporting Information: N/a ____________________________________________________    No orders of the defined types were placed in this encounter.   Meds ordered this encounter  Medications   WEGOVY 0.25 MG/0.5ML SOAJ    Sig: Inject 0.25 mg into the skin once a week.    Dispense:  2 mL    Refill:  0    Follow up plan: Return if symptoms worsen or fail to improve.    Saralyn Pilar, DO [facility] 11/26/2023, 9:50 AM

## 2023-11-27 NOTE — Progress Notes (Signed)
Pa started   [employer] (Key: [id number]) Rx #: [id number] [phone] 0.25MG /0.5ML auto-injectors Form [employer] Call-In Form  Plan Contact [phone] phone  Spoke with [employer] they do not cover wegovy at all.

## 2023-11-29 ENCOUNTER — Telehealth: Payer: Self-pay

## 2023-11-29 NOTE — Telephone Encounter (Signed)
Understood. The rx was sent to [facility] it looks like back on 2/17. If she is using manufacturer coupon, does she need anything else from [place] or can she pick up medication?  Thank you!  Saralyn Pilar, DO [facility] 11/29/2023, 6:08 PM

## 2023-12-07 ENCOUNTER — Ambulatory Visit: Payer: Medicare Other

## 2023-12-07 DIAGNOSIS — Z0000 Encounter for general adult medical examination without abnormal findings: Secondary | ICD-10-CM

## 2023-12-07 NOTE — Patient Instructions (Addendum)
 Jennifer Hayes , Thank you for taking time to come for your Medicare Wellness Visit. I appreciate your ongoing commitment to your health goals. Please review the following plan we discussed and let me know if I can assist you in the future.   Referrals/Orders/Follow-Ups/Clinician Recommendations: NONE  This is a list of the screening recommended for you and due dates:  Health Maintenance  Topic Date Due   Zoster (Shingles) Vaccine (1 of 2) Never done   COVID-19 Vaccine (4 - 2024-25 season) 06/10/2023   Pneumonia Vaccine (1 of 2 - PCV) 08/05/2024*   Medicare Annual Wellness Visit  12/06/2024   DTaP/Tdap/Td vaccine (2 - Td or Tdap) 06/01/2025   Flu Shot  Completed   DEXA scan (bone density measurement)  Completed   HPV Vaccine  Aged Out   Screening for Lung Cancer  Discontinued  *Topic was postponed. The date shown is not the original due date.    Advanced directives: (ACP Link)Information on Advanced Care Planning can be found at [facility] Advance Health Care Directives Advance Health Care Directives ([email or web address]   Next Medicare Annual Wellness Visit scheduled for next year: Yes   12/12/24 @ 9:30 AM BY PHONE

## 2023-12-07 NOTE — Progress Notes (Signed)
 Subjective:   Jennifer Hayes is a 83 y.o. who presents for a Medicare Wellness preventive visit.  Visit Complete: Virtual I connected with  Marinna Blane Isa on 12/07/23 by a audio enabled telemedicine application and verified that I am speaking with the correct person using two identifiers.  Patient Location: Home  Provider Location: Office/Clinic  I discussed the limitations of evaluation and management by telemedicine. The patient expressed understanding and agreed to proceed.  Vital Signs: Because this visit was a virtual/telehealth visit, some criteria may be missing or patient reported. Any vitals not documented were not able to be obtained and vitals that have been documented are patient reported.  VideoDeclined- This patient declined Librarian, academic. Therefore the visit was completed with audio only.  AWV Questionnaire: No: Patient Medicare AWV questionnaire was not completed prior to this visit.  Cardiac Risk Factors include: advanced age (>5men, >76 women);dyslipidemia;hypertension;sedentary lifestyle;obesity (BMI >30kg/m2);smoking/ tobacco exposure     Objective:    Today's Vitals   12/07/23 0935  PainSc: 5    There is no height or weight on file to calculate BMI.     12/07/2023    9:42 AM 08/18/2022   10:39 AM 08/18/2022    9:00 AM 08/05/2022    2:16 PM 10/04/2021    4:06 AM 06/21/2021    4:35 PM 03/09/2020    3:13 PM  Advanced Directives  Does Patient Have a Medical Advance Directive? No No No No No No   Would patient like information on creating a medical advance directive? No - Patient declined No - Patient declined No - Patient declined No - Patient declined  No - Patient declined No - Patient declined    Current Medications (verified) Outpatient Encounter Medications as of 12/07/2023  Medication Sig   albuterol (VENTOLIN HFA) 108 (90 Base) MCG/ACT inhaler Inhale 2 puffs into the lungs every 6 (six) hours as  needed for wheezing or shortness of breath.   celecoxib (CELEBREX) 200 MG capsule Take 1 capsule (200 mg total) by mouth daily.   citalopram (CELEXA) 40 MG tablet Take 1 tablet (40 mg total) by mouth daily.   cyanocobalamin (VITAMIN B12) 1000 MCG tablet Take 1 tablet (1,000 mcg total) by mouth daily.   esomeprazole (NEXIUM) 40 MG capsule Take 1 capsule (40 mg total) by mouth daily before breakfast.   fluticasone furoate-vilanterol (BREO ELLIPTA) 100-25 MCG/ACT AEPB Inhale 1 puff into the lungs daily.   ipratropium-albuterol (DUONEB) 0.5-2.5 (3) MG/3ML SOLN Take 3 mLs by nebulization every 6 (six) hours as needed.   rosuvastatin (CRESTOR) 10 MG tablet Take 1 tablet (10 mg total) by mouth at bedtime.   Tiotropium Bromide Monohydrate (SPIRIVA RESPIMAT) 2.5 MCG/ACT AERS Inhale 2 puffs into the lungs daily.   traZODone (DESYREL) 50 MG tablet Take 1 tablet (50 mg total) by mouth at bedtime as needed. for sleep   WEGOVY 0.25 MG/0.5ML SOAJ Inject 0.25 mg into the skin once a week. (Patient not taking: Reported on 12/07/2023)   No facility-administered encounter medications on file as of 12/07/2023.    Allergies (verified) Penicillins and Bupropion   History: Past Medical History:  Diagnosis Date   Arthritis    CHF (congestive heart failure) (HCC)    COPD (chronic obstructive pulmonary disease) (HCC)    Depression    GERD (gastroesophageal reflux disease)    Stroke [facility])    Past Surgical History:  Procedure Laterality Date   APPENDECTOMY     BACK SURGERY  Family History  Problem Relation Age of Onset   Dementia Mother    Diabetes Mother    Social History   Socioeconomic History   Marital status: Widowed    Spouse name: Not on file   Number of children: Not on file   Years of education: Not on file   Highest education level: Not on file  Occupational History   Not on file  Tobacco Use   Smoking status: Every Day    Current packs/day: 1.00    Average packs/day: 1 pack/day for  50.0 years (50.0 ttl pk-yrs)    Types: Cigarettes   Smokeless tobacco: Never  Vaping Use   Vaping status: Some Days  Substance and Sexual Activity   Alcohol use: Not Currently    Comment: occasionally   Drug use: Never   Sexual activity: Yes  Other Topics Concern   Not on file  Social History Narrative   She lives alone at home and has a cat that she adores.    Social Drivers of Corporate investment banker Strain: Low Risk  (12/07/2023)   Overall Financial Resource Strain (CARDIA)    Difficulty of Paying Living Expenses: Not hard at all  Food Insecurity: No Food Insecurity (12/07/2023)   Hunger Vital Sign    Worried About Running Out of Food in the Last Year: Never true    Ran Out of Food in the Last Year: Never true  Transportation Needs: No Transportation Needs (12/07/2023)   PRAPARE - Administrator, Civil Service (Medical): No    Lack of Transportation (Non-Medical): No  Physical Activity: Insufficiently Active (12/07/2023)   Exercise Vital Sign    Days of Exercise per Week: 2 days    Minutes of Exercise per Session: 20 min  Stress: No Stress Concern Present (12/07/2023)   [employer] of Occupational Health - Occupational Stress Questionnaire    Feeling of Stress : Not at all  Social Connections: Moderately Isolated (12/07/2023)   Social Connection and Isolation Panel [NHANES]    Frequency of Communication with Friends and Family: More than three times a week    Frequency of Social Gatherings with Friends and Family: More than three times a week    Attends Religious Services: More than 4 times per year    Active Member of [employer] or Organizations: No    Attends Banker Meetings: Never    Marital Status: Widowed    Tobacco Counseling Ready to quit: Not Answered Counseling given: Not Answered    Clinical Intake:  Pre-visit preparation completed: Yes  Pain : 0-10 Pain Score: 5  Pain Type: Chronic pain Pain Location: Shoulder Pain  Orientation: Right, Left Pain Radiating Towards: RIGHT WORSE THAN LEFT Pain Descriptors / Indicators: Aching, Discomfort, Constant Pain Onset: More than a month ago Pain Frequency: Constant Pain Relieving Factors: SHOTS  Pain Relieving Factors: SHOTS  BMI - recorded: 30 Nutritional Status: BMI > 30  Obese Nutritional Risks: None Diabetes: No  How often do you need to have someone help you when you read instructions, pamphlets, or other written materials from your doctor or pharmacy?: 1 - Never  Interpreter Needed?: No  Information entered by :: Kennedy Bucker, LPN   Activities of Daily Living     12/07/2023    9:43 AM  In your present state of health, do you have any difficulty performing the following activities:  Hearing? 1  Vision? 0  Difficulty concentrating or making decisions? 0  Walking or  climbing stairs? 1  Dressing or bathing? 0  Doing errands, shopping? 0  Preparing Food and eating ? N  Using the Toilet? N  In the past six months, have you accidently leaked urine? N  Do you have problems with loss of bowel control? N  Managing your Medications? N  Managing your Finances? N  Housekeeping or managing your Housekeeping? N    Patient Care Team: Smitty Cords, DO as PCP - General (Family Medicine)  Indicate any recent Medical Services you may have received from other than [facility] providers in the past year (date may be approximate).     Assessment:   This is a routine wellness examination for [place].  Hearing/Vision screen Hearing Screening - Comments:: NO AIDS- HAS AUDIOLOGY COMING UP NEXT WEEK, IS HOH Vision Screening - Comments:: READERS- DR.RICHARDSON, GETS SHOTS IN BOTH EYES ONCE PER MONTH   Goals Addressed             This Visit's Progress    DIET - EAT MORE FRUITS AND VEGETABLES         Depression Screen     12/07/2023    9:40 AM 08/06/2023   11:28 PM 04/28/2022   10:05 AM 03/24/2022   11:30 AM 03/24/2022   11:29 AM 10/31/2021    10:41 AM 04/12/2021    9:48 AM  PHQ 2/9 Scores  PHQ - 2 Score 0 2 0 0 0 0 0  PHQ- 9 Score 0 7 0 3  2     Fall Risk     12/07/2023    9:43 AM 04/28/2022    1:38 PM 10/31/2021   10:42 AM 04/12/2021    9:48 AM 12/21/2020   10:15 AM  Fall Risk   Falls in the past year? 1 0 0 0 0  Number falls in past yr: 1 0 0    Injury with Fall? 0 0 0    Risk for fall due to : Impaired balance/gait No Fall Risks   No Fall Risks  Follow up Falls evaluation completed;Falls prevention discussed Falls evaluation completed Falls evaluation completed  Falls evaluation completed    MEDICARE RISK AT HOME:  Medicare Risk at Home Any stairs in or around the home?: No If so, are there any without handrails?: No Home free of loose throw rugs in walkways, pet beds, electrical cords, etc?: Yes Adequate lighting in your home to reduce risk of falls?: Yes Life alert?: No Use of a cane, walker or w/c?: Yes (KEEPS CANE IN CAR) Grab bars in the bathroom?: Yes Shower chair or bench in shower?: Yes Elevated toilet seat or a handicapped toilet?: No  TIMED UP AND GO:  Was the test performed?  No  Cognitive Function: 6CIT completed    04/28/2022    1:39 PM  MMSE - Mini Mental State Exam  Not completed: Unable to complete        12/07/2023    9:45 AM 04/28/2022    1:39 PM  6CIT Screen  What Year? 0 points 0 points  What month? 0 points 0 points  What time? 0 points 0 points  Count back from 20 0 points 0 points  Months in reverse 4 points 0 points  Repeat phrase 0 points 0 points  Total Score 4 points 0 points    Immunizations Immunization History  Administered Date(s) Administered   Fluad Trivalent(High Dose 65+) 06/18/2023   Influenza Inj Mdck Quad Pf 11/23/2016   Influenza Inj Mdck Quad With Preservative  11/23/2016   Influenza, High Dose Seasonal PF 06/02/2015   Influenza,inj,Quad PF,6+ Mos 11/23/2016   Influenza-Unspecified 11/23/2016   Moderna Sars-Covid-2 Vaccination 11/11/2019, 12/09/2019,  10/06/2020   Tdap 06/02/2015    Screening Tests Health Maintenance  Topic Date Due   Zoster Vaccines- Shingrix (1 of 2) Never done   COVID-19 Vaccine (4 - 2024-25 season) 06/10/2023   Pneumonia Vaccine 108+ Years old (1 of 2 - PCV) 08/05/2024 (Originally 11/02/1946)   Medicare Annual Wellness (AWV)  12/06/2024   DTaP/Tdap/Td (2 - Td or Tdap) 06/01/2025   INFLUENZA VACCINE  Completed   DEXA SCAN  Completed   HPV VACCINES  Aged Out   Lung Cancer Screening  Discontinued    Health Maintenance  Health Maintenance Due  Topic Date Due   Zoster Vaccines- Shingrix (1 of 2) Never done   COVID-19 Vaccine (4 - 2024-25 season) 06/10/2023   Health Maintenance Items Addressed: DECLINED LUNG CA SCREENING  Additional Screening:  Vision Screening: Recommended annual ophthalmology exams for early detection of glaucoma and other disorders of the eye.  Dental Screening: Recommended annual dental exams for proper oral hygiene  Community Resource Referral / Chronic Care Management: CRR required this visit?  No   CCM required this visit?  No     Plan:     I have personally reviewed and noted the following in the patient's chart:   Medical and social history Use of alcohol, tobacco or illicit drugs  Current medications and supplements including opioid prescriptions. Patient is not currently taking opioid prescriptions. Functional ability and status Nutritional status Physical activity Advanced directives List of other physicians Hospitalizations, surgeries, and ER visits in previous 12 months Vitals Screenings to include cognitive, depression, and falls Referrals and appointments  In addition, I have reviewed and discussed with patient certain preventive protocols, quality metrics, and best practice recommendations. A written personalized care plan for preventive services as well as general preventive health recommendations were provided to patient.     Hal Hope,  LPN   8/41/6606   After Visit Summary: (MyChart) Due to this being a telephonic visit, the after visit summary with patients personalized plan was offered to patient via MyChart   Notes: Nothing significant to report at this time.

## 2023-12-12 ENCOUNTER — Other Ambulatory Visit: Payer: Self-pay | Admitting: Family Medicine

## 2023-12-12 DIAGNOSIS — G8929 Other chronic pain: Secondary | ICD-10-CM

## 2023-12-12 DIAGNOSIS — M12811 Other specific arthropathies, not elsewhere classified, right shoulder: Secondary | ICD-10-CM

## 2023-12-12 DIAGNOSIS — M150 Primary generalized (osteo)arthritis: Secondary | ICD-10-CM

## 2023-12-12 NOTE — Telephone Encounter (Signed)
 Copied from CRM [id number]. Topic: Clinical - Medication Refill >> Dec 12, 2023 10:43 AM Elle L wrote: Most Recent Primary Care Visit:  Provider: [facility]  Department: [facility]  Visit Type: LAB  Date: 11/06/2023  Medication: celecoxib (CELEBREX) 200 MG capsule   Has the patient contacted their pharmacy? Yes  Is this the correct pharmacy for this prescription? Yes  This is the patient's preferred pharmacy:  [facility] - [place], [place] - [address] [facility] [address] [place] [address] [place] [address] Phone: [phone] Fax: [phone]  Has the prescription been filled recently? No  Is the patient out of the medication? Yes  Has the patient been seen for an appointment in the last year OR does the patient have an upcoming appointment? Yes  Can we respond through MyChart? No  Agent: Please be advised that Rx refills may take up to 3 business days. We ask that you follow-up with your pharmacy.

## 2023-12-13 NOTE — Telephone Encounter (Signed)
 Refused Celebrex because this is a duplicate request.

## 2023-12-17 ENCOUNTER — Telehealth: Payer: Self-pay

## 2023-12-17 NOTE — Telephone Encounter (Signed)
 Unable to complete prior auth with covermymeds, attempted to reach [place] to complete the prior auth as advised -[phone]. Advised there was an 1 1/2-2 hour wait. Left my name and number to call back. Will keep trying.

## 2023-12-17 NOTE — Telephone Encounter (Signed)
 Jennifer Hayes (KeyPhilomena Doheny) Rx #: [id number] 0.25MG /0.5ML auto-injectors Form [employer] Call-In Form  Plan Contact [phone] phone Created 3 days ago Sent to Plan Determination Call-in Form Please call the payer at [phone] to complete this PA.  To initiate a request for this medication, please contact [facility] at [phone].

## 2023-12-19 NOTE — Telephone Encounter (Signed)
 There is a previous message in patients chart as follows:   [facility] Call-In Form  Plan Contact [phone] phone   Spoke with [facility] they do not cover wegovy at all.

## 2023-12-20 ENCOUNTER — Other Ambulatory Visit: Payer: Self-pay | Admitting: Family Medicine

## 2023-12-20 ENCOUNTER — Other Ambulatory Visit (INDEPENDENT_AMBULATORY_CARE_PROVIDER_SITE_OTHER): Admitting: Pharmacist

## 2023-12-20 DIAGNOSIS — Z8673 Personal history of transient ischemic attack (TIA), and cerebral infarction without residual deficits: Secondary | ICD-10-CM

## 2023-12-20 DIAGNOSIS — E66811 Obesity, class 1: Secondary | ICD-10-CM

## 2023-12-20 DIAGNOSIS — I700 Atherosclerosis of aorta: Secondary | ICD-10-CM

## 2023-12-20 MED ORDER — WEGOVY 0.25 MG/0.5ML ~~LOC~~ SOAJ: 0.2500 mg | SUBCUTANEOUS | 0 refills | Status: DC

## 2023-12-20 NOTE — Progress Notes (Signed)
   12/20/2023  Patient ID: Jennifer Hayes, female   DOB: 01-22-1941, 83 y.o.   MRN: [id number]  Receive message from [facility] [place]ney [place] requesting assistance with medication access for patient for [facility].  From review of chart, note patient has benefits through/receives medication through [employer]. From review of [employer], note Reginal Lutes is a non-formulary medication. Based on review VA criteria for use, patient appears to meet requirement of "BMI 27 to < 40 with previous myocardial infarction, previous stroke, or symptomatic peripheral arterial disease" - Note patient with History of TIA Z86.73, Aortic Atherosclerosis (I70)  - Collaborate with PCP. Provider re-sends prescription to [facility] including these diagnosis codes, along with documentation of patient's BMI.   Follow up with Jennifer Hayes at [facility] who confirms criteria met with this inclusion of documentation of major adverse cardiovascular events (MACE). Advises [phone] prescription will be filled for patient and shipped to her within the next 14-21 business days. - Follow up with patient today to provide update  Conversation limited today as patient reports that she is not currently home. Schedule telephone appointment to speak further regarding Wegovy and weight management  Follow Up Plan: Clinical Pharmacist will follow up with patient by telephone on 01/07/2024 at 8:30 AM   Estelle Grumbles, PharmD, [facility] [phone]

## 2023-12-20 NOTE — Patient Instructions (Signed)
 Start Wegovy 0.25 mg once weekly. This medication may cause stomach upset, queasiness, or constipation, especially when first starting. This generally improves over time. Call our office if these symptoms occur and worsen, or if you have severe symptoms such as vomiting, diarrhea, or stomach pain.   Estelle Grumbles, PharmD, [facility] Clinical Pharmacist [facility] [phone]

## 2023-12-31 ENCOUNTER — Other Ambulatory Visit (INDEPENDENT_AMBULATORY_CARE_PROVIDER_SITE_OTHER): Admitting: Pharmacist

## 2023-12-31 ENCOUNTER — Other Ambulatory Visit: Payer: Self-pay | Admitting: Family Medicine

## 2023-12-31 DIAGNOSIS — I700 Atherosclerosis of aorta: Secondary | ICD-10-CM

## 2023-12-31 DIAGNOSIS — E66811 Obesity, class 1: Secondary | ICD-10-CM

## 2023-12-31 MED ORDER — WEGOVY 0.25 MG/0.5ML ~~LOC~~ SOAJ: 0.2500 mg | SUBCUTANEOUS | 0 refills | Status: DC

## 2023-12-31 NOTE — Progress Notes (Unsigned)
   12/31/2023  Patient ID: Jennifer Hayes, female   DOB: 05/16/1941, 83 y.o.   MRN: [id number]  Receive a call from patient requesting a call back.  Return call. Reports received a letter from [facility] dated 12/26/2023 stating that she is NOT approved to receive Wegovy.  Follow up with Aundra Millet at [facility] who checks with her supervisor and states that the [facility] prescription needs to be resubmitted with only ICD-10 code for Aortic Atherosclerosis (I70) only.  Will collaborate with PCP to request provider resend prescription accordingly  Estelle Grumbles, PharmD, [facility] Clinical Pharmacist [facility] [phone]

## 2024-01-01 NOTE — Progress Notes (Signed)
   01/01/2024  Patient ID: Jennifer Hayes, female   DOB: 04/14/1941, 83 y.o.   MRN: [id number]    Follow up with [place] at [facility] who confirms that new prescription with only diagnosis ICD-10 code for Aortic Atherosclerosis (I70) included was received. Advises [phone] prescription will be filled for patient and shipped to her within the next 14-21 business days. - Follow up with patient today to provide update  Follow Up Plan: Clinical Pharmacist will follow up with patient by telephone on 01/21/2024 8:30 AM    Estelle Grumbles, PharmD, [facility] [phone]

## 2024-01-07 ENCOUNTER — Other Ambulatory Visit

## 2024-01-21 ENCOUNTER — Other Ambulatory Visit (INDEPENDENT_AMBULATORY_CARE_PROVIDER_SITE_OTHER): Admitting: Pharmacist

## 2024-01-21 ENCOUNTER — Telehealth: Payer: Self-pay | Admitting: Pharmacist

## 2024-01-21 DIAGNOSIS — I700 Atherosclerosis of aorta: Secondary | ICD-10-CM

## 2024-01-21 DIAGNOSIS — E66811 Obesity, class 1: Secondary | ICD-10-CM

## 2024-01-21 NOTE — Telephone Encounter (Addendum)
   Outreach Note  01/21/2024 Name: Jennifer Hayes MRN: [id number] DOB: Oct 22, 1940  Referred by: Raina Bunting, DO  Was unable to reach patient via telephone today and have left HIPAA compliant voicemail asking patient to return my call.    Arthur Lash, PharmD, [facility] Clinical Pharmacist [facility] [phone]

## 2024-01-21 NOTE — Progress Notes (Signed)
 01/21/2024 Name: Jennifer Hayes MRN: [id number] DOB: 1941-03-25  Chief Complaint  Patient presents with   Medication Management   Medication Assistance    Jennifer Hayes is a 83 y.o. year old female who presented for a telephone visit.   They were referred to the pharmacist by their PCP for assistance in managing medication access.    Subjective:  Care Team: Primary Care Provider: Smitty Cords, DO   Medication Access/Adherence  Current Pharmacy:  [facility] - [place], [place] - [address] [facility] [address] [place] [address] [place] [address] Phone: [phone] Fax: [phone]  [id number] - [place], [place] - [address] [place] [address] Phone: [phone] Fax: [phone]   Patient reports affordability concerns with their medications: No  Patient reports access/transportation concerns to their pharmacy: No  Patient reports adherence concerns with their medications:  No     Obesity, Complicated by Aortic atherosclerosis:  Current medications: Wegovy 0.25 mg once weekly (started yesterday)  Denies any difficulty with administration of Wegovy  Current physical activity: reports movement limited by having rods in her back/pain and arthritis in arms; does leg exercises daily. Hopes to start walking more with weight loss   Objective:  Lab Results  Component Value Date   HGBA1C 5.7 (H) 11/06/2023    Lab Results  Component Value Date   CREATININE 0.58 (L) 11/06/2023   BUN 15 11/06/2023   NA 141 11/06/2023   K 4.2 11/06/2023   CL 101 11/06/2023   CO2 31 11/06/2023     Current Outpatient Medications on File Prior to Visit  Medication Sig Dispense Refill   albuterol (VENTOLIN HFA) 108 (90 Base) MCG/ACT inhaler Inhale 2 puffs into the lungs every 6 (six) hours as needed for wheezing or shortness of breath. 8 g 2   celecoxib (CELEBREX) 200 MG capsule Take 1 capsule (200 mg total) by mouth  daily. 90 capsule 3   citalopram (CELEXA) 40 MG tablet Take 1 tablet (40 mg total) by mouth daily. 90 tablet 3   cyanocobalamin (VITAMIN B12) 1000 MCG tablet Take 1 tablet (1,000 mcg total) by mouth daily. 90 tablet 3   esomeprazole (NEXIUM) 40 MG capsule Take 1 capsule (40 mg total) by mouth daily before breakfast. 90 capsule 3   fluticasone furoate-vilanterol (BREO ELLIPTA) 100-25 MCG/ACT AEPB Inhale 1 puff into the lungs daily. 1 each 11   ipratropium-albuterol (DUONEB) 0.5-2.5 (3) MG/3ML SOLN Take 3 mLs by nebulization every 6 (six) hours as needed. 360 mL 12   rosuvastatin (CRESTOR) 10 MG tablet Take 1 tablet (10 mg total) by mouth at bedtime. 90 tablet 3   Tiotropium Bromide Monohydrate (SPIRIVA RESPIMAT) 2.5 MCG/ACT AERS Inhale 2 puffs into the lungs daily. 1 each 11   traZODone (DESYREL) 50 MG tablet Take 1 tablet (50 mg total) by mouth at bedtime as needed. for sleep 90 tablet 1   WEGOVY 0.25 MG/0.5ML SOAJ Inject 0.25 mg into the skin once a week. 2 mL 0   No current facility-administered medications on file prior to visit.       Assessment/Plan:   Obesity: - Dietary counseling including education on focus on lean proteins, fruits and vegetables, whole grains and increased fiber consumption, adequate hydration - Ability to exercise limited by pain - Review potential side effects with Wegovy of nausea, stomach upset, queasiness, constipation, and that these generally improve over time. Counsel on strategies to aid with tolerability of Wegovy. Advised to contact  our office with more severe symptoms, including nausea, diarrhea, stomach pain. Patient verbalized understanding. - Agree to follow up again in ~3 weeks   Follow Up Plan: Clinical Pharmacist will follow up with patient by telephone on 02/11/2024 at 8:00 AM   Arthur Lash, PharmD, [facility] Clinical Pharmacist [facility] [phone]

## 2024-01-21 NOTE — Patient Instructions (Signed)
 Goals Addressed             This Visit's Progress    Pharmacy Goals       [phone] may cause stomach upset, queasiness, or constipation, especially when first starting. This generally improves over time. Call our office if these symptoms occur and worsen, or if you have severe symptoms such as vomiting, diarrhea, or stomach pain.   Arthur Lash, PharmD, [facility] Clinical Pharmacist [facility] [phone]

## 2024-02-04 ENCOUNTER — Other Ambulatory Visit: Payer: Self-pay

## 2024-02-04 DIAGNOSIS — I700 Atherosclerosis of aorta: Secondary | ICD-10-CM

## 2024-02-04 MED ORDER — WEGOVY 0.5 MG/0.5ML ~~LOC~~ SOAJ: 0.5000 mg | SUBCUTANEOUS | 0 refills | Status: DC

## 2024-02-11 ENCOUNTER — Other Ambulatory Visit (INDEPENDENT_AMBULATORY_CARE_PROVIDER_SITE_OTHER): Admitting: Pharmacist

## 2024-02-11 DIAGNOSIS — I700 Atherosclerosis of aorta: Secondary | ICD-10-CM

## 2024-02-11 DIAGNOSIS — E66811 Obesity, class 1: Secondary | ICD-10-CM

## 2024-02-11 NOTE — Patient Instructions (Signed)
 Goals Addressed             This Visit's Progress    Pharmacy Goals       [phone] may cause stomach upset, queasiness, or constipation, especially when first starting. This generally improves over time. Call our office if these symptoms occur and worsen, or if you have severe symptoms such as vomiting, diarrhea, or stomach pain.   Arthur Lash, PharmD, [facility] Clinical Pharmacist [facility] [phone]

## 2024-02-11 NOTE — Progress Notes (Signed)
 02/11/2024 Name: Jennifer Hayes MRN: [id number] DOB: [birth year]  Chief Complaint  Patient presents with   Medication Management    Jennifer Hayes is a 83 y.o. year old female who presented for a telephone visit.   They were referred to the pharmacist by their PCP for assistance in managing medication access.      Subjective:   Care Team: Primary Care Provider: Raina Bunting, DO  Medication Access/Adherence  Current Pharmacy:  [facility] - [place], [place] - [address] [facility] [address] [place] [address] [place] [address] Phone: [phone] Fax: [phone]  [id number] - [place], [place] - [address] [place] [address] Phone: [phone] Fax: [phone]   Patient reports affordability concerns with their medications: No  Patient reports access/transportation concerns to their pharmacy: No  Patient reports adherence concerns with their medications:  No       Obesity, Complicated by Aortic atherosclerosis:   Current medications: Wegovy  0.25 mg once weekly on Mondays (taking 4th dose today)   Reports tolerating well. Denies side effects.  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 Plans to start walking more with weight loss   Objective:  Lab Results  Component Value Date   HGBA1C 5.7 (H) 11/06/2023    Lab Results  Component Value Date   CREATININE 0.58 (L) 11/06/2023   BUN 15 11/06/2023   NA 141 11/06/2023   K 4.2 11/06/2023   CL 101 11/06/2023   CO2 31 11/06/2023   Medications Reviewed Today     Reviewed by Ardis Becton, RPH-CPP (Pharmacist) on 02/11/24 at 0818  Med List Status: <None>   Medication Order Taking? Sig Documenting Provider Last Dose Status Informant  albuterol  (VENTOLIN  HFA) 108 (90 Base) MCG/ACT  inhaler [id number] No Inhale 2 puffs into the lungs every 6 (six) hours as needed for wheezing or shortness of breath. Verla Glaze, MD Taking Active Self  celecoxib  (CELEBREX ) 200 MG capsule [id number] No Take 1 capsule (200 mg total) by mouth daily. Raina Bunting, DO Taking Active   citalopram  (CELEXA ) 40 MG tablet [id number] No Take 1 tablet (40 mg total) by mouth daily. Raina Bunting, DO Taking Active   cyanocobalamin  (VITAMIN B12) 1000 MCG tablet [id number] No Take 1 tablet (1,000 mcg total) by mouth daily. Raina Bunting, DO Taking Active   esomeprazole  (NEXIUM ) 40 MG capsule [id number] No Take 1 capsule (40 mg total) by mouth daily before breakfast. Raina Bunting, DO Taking Active   fluticasone  furoate-vilanterol (BREO ELLIPTA ) 100-25 MCG/ACT AEPB [id number] No Inhale 1 puff into the lungs daily. Vergia Glasgow, MD Taking Active            Med Note Langston Pippins, Deanna Expose   Fri Dec 07, 2023  9:38 AM) TAKES PRN  ipratropium-albuterol  (DUONEB) 0.5-2.5 (3) MG/3ML SOLN [id number] No Take 3 mLs by nebulization every 6 (six) hours as needed. Vergia Glasgow, MD Taking Active   rosuvastatin  (CRESTOR ) 10 MG tablet [id number] No Take 1 tablet (10 mg total) by mouth at bedtime. Raina Bunting, DO Taking Active   Tiotropium Bromide Monohydrate  (SPIRIVA  RESPIMAT) 2.5 MCG/ACT AERS [id number] No Inhale 2 puffs into the lungs daily. Vergia Glasgow, MD Taking Active            Med Note Langston Pippins, Deanna Expose   Fri  Dec 07, 2023  9:38 AM) TAKES PRN  traZODone  (DESYREL ) 50 MG tablet [id number] No Take 1 tablet (50 mg total) by mouth at bedtime as needed. for sleep Raina Bunting, DO Taking Active   WEGOVY  0.5 MG/0.5ML [place] [id number]  Inject 0.5 mg into the skin once a week. Raina Bunting, DO  Active               Assessment/Plan:   Obesity: - Dietary counseling including education on focus on lean proteins, fruits and vegetables, whole grains and  increased fiber consumption, adequate hydration - Ability to exercise limited by pain - From review of chart, note PCP has already sent prescription for increase in dose of Wegovy  to 0.5 mg once weekly to [facility] for patient. Counsel patient that she may increase to this dose when received from pharmacy - Agree to follow up again in ~4 weeks     Follow Up Plan: Clinical Pharmacist will follow up with patient by telephone on 03/10/2024 at 8:30 AM    Arthur Lash, PharmD, [facility] Clinical Pharmacist [facility] [phone]

## 2024-02-18 ENCOUNTER — Other Ambulatory Visit

## 2024-02-18 DIAGNOSIS — J431 Panlobular emphysema: Secondary | ICD-10-CM

## 2024-02-18 DIAGNOSIS — E538 Deficiency of other specified B group vitamins: Secondary | ICD-10-CM

## 2024-02-18 DIAGNOSIS — R7309 Other abnormal glucose: Secondary | ICD-10-CM

## 2024-02-18 DIAGNOSIS — E782 Mixed hyperlipidemia: Secondary | ICD-10-CM

## 2024-02-20 ENCOUNTER — Other Ambulatory Visit

## 2024-02-21 ENCOUNTER — Ambulatory Visit: Payer: Self-pay | Admitting: Family Medicine

## 2024-02-21 LAB — COMPLETE METABOLIC PANEL WITHOUT GFR
AG Ratio: 2.1 (calc) (ref 1.0–2.5)
ALT: 5 U/L — ABNORMAL LOW (ref 6–29)
AST: 8 U/L — ABNORMAL LOW (ref 10–35)
Albumin: 4 g/dL (ref 3.6–5.1)
Alkaline phosphatase (APISO): 55 U/L (ref 37–153)
BUN/Creatinine Ratio: 22 (calc) (ref 6–22)
BUN: 13 mg/dL (ref 7–25)
CO2: 31 mmol/L (ref 20–32)
Calcium: 8.9 mg/dL (ref 8.6–10.4)
Chloride: 103 mmol/L (ref 98–110)
Creat: 0.59 mg/dL — ABNORMAL LOW (ref 0.60–0.95)
Globulin: 1.9 g/dL (ref 1.9–3.7)
Glucose, Bld: 90 mg/dL (ref 65–99)
Potassium: 3.4 mmol/L — ABNORMAL LOW (ref 3.5–5.3)
Sodium: 140 mmol/L (ref 135–146)
Total Bilirubin: 0.4 mg/dL (ref 0.2–1.2)
Total Protein: 5.9 g/dL — ABNORMAL LOW (ref 6.1–8.1)

## 2024-02-21 LAB — CBC WITH DIFFERENTIAL/PLATELET
Absolute Lymphocytes: 2595 {cells}/uL (ref 850–3900)
Absolute Monocytes: 398 {cells}/uL (ref 200–950)
Basophils Absolute: 60 {cells}/uL (ref 0–200)
Basophils Relative: 0.8 %
Eosinophils Absolute: 120 {cells}/uL (ref 15–500)
Eosinophils Relative: 1.6 %
HCT: 41 % (ref 35.0–45.0)
Hemoglobin: 13.3 g/dL (ref 11.7–15.5)
MCH: 29.8 pg (ref 27.0–33.0)
MCHC: 32.4 g/dL (ref 32.0–36.0)
MCV: 91.9 fL (ref 80.0–100.0)
MPV: 10.3 fL (ref 7.5–12.5)
Monocytes Relative: 5.3 %
Neutro Abs: 4328 {cells}/uL (ref 1500–7800)
Neutrophils Relative %: 57.7 %
Platelets: 220 10*3/uL (ref 140–400)
RBC: 4.46 10*6/uL (ref 3.80–5.10)
RDW: 13.2 % (ref 11.0–15.0)
Total Lymphocyte: 34.6 %
WBC: 7.5 10*3/uL (ref 3.8–10.8)

## 2024-02-21 LAB — HEMOGLOBIN A1C
Hgb A1c MFr Bld: 5.5 % (ref <5.7)
Mean Plasma Glucose: 111 mg/dL
eAG (mmol/L): 6.2 mmol/L

## 2024-02-21 LAB — TSH: TSH: 3.01 m[IU]/L (ref 0.40–4.50)

## 2024-02-21 LAB — LIPID PANEL
Cholesterol: 241 mg/dL — ABNORMAL HIGH (ref <200)
HDL: 47 mg/dL — ABNORMAL LOW (ref >=50)
LDL Cholesterol (Calc): 162 mg/dL — ABNORMAL HIGH
Non-HDL Cholesterol (Calc): 194 mg/dL — ABNORMAL HIGH (ref <130)
Total CHOL/HDL Ratio: 5.1 (calc) — ABNORMAL HIGH (ref <5.0)
Triglycerides: 175 mg/dL — ABNORMAL HIGH (ref <150)

## 2024-02-21 LAB — VITAMIN B12: Vitamin B-12: 188 pg/mL — ABNORMAL LOW (ref 200–1100)

## 2024-02-22 ENCOUNTER — Other Ambulatory Visit: Payer: Self-pay | Admitting: Family Medicine

## 2024-02-22 ENCOUNTER — Telehealth: Payer: Self-pay

## 2024-02-22 DIAGNOSIS — E782 Mixed hyperlipidemia: Secondary | ICD-10-CM

## 2024-02-22 DIAGNOSIS — E538 Deficiency of other specified B group vitamins: Secondary | ICD-10-CM

## 2024-02-22 MED ORDER — ROSUVASTATIN CALCIUM 10 MG PO TABS: 10.0000 mg | ORAL_TABLET | Freq: Every day | ORAL | 3 refills | Status: AC

## 2024-02-22 MED ORDER — CYANOCOBALAMIN 1000 MCG/ML IJ SOLN: 1000.0000 ug | INTRAMUSCULAR | 0 refills | Status: AC

## 2024-02-22 NOTE — Telephone Encounter (Signed)
 Copied from CRM [id number]. Topic: Clinical - Lab/Test Results >> Feb 22, 2024 10:16 AM Zipporah Him wrote: Reason for CRM: Patient returning missed call from 5/15 from [place], she wanted to speak with her about her recent labs. Has some questions. CAL advised she is not in office today, patient requests to have a call back from a nurse when they are available.

## 2024-02-22 NOTE — Telephone Encounter (Signed)
 Patient advised of lab results per Dr. Linnell Richardson.

## 2024-02-25 ENCOUNTER — Telehealth: Payer: Self-pay

## 2024-02-25 NOTE — Telephone Encounter (Signed)
 Copied from CRM [id number]. Topic: Clinical - Lab/Test Results >> Feb 25, 2024  8:17 AM Everette C wrote: Reason for CRM: The patient would like to be contacted by a member of staff to review their results from 02/20/24   Please contact further when possible

## 2024-02-25 NOTE — Telephone Encounter (Signed)
 Copied from CRM [id number]. Topic: Clinical - Medication Question >> Feb 25, 2024  8:37 AM Hassie Lint wrote: Reason for CRM: Patient called in to talk about her recent lab work. In Dr notes states recommends patient to get a B12 shot. Patient would like to [facility] [place] so she can get the treatment without any cost.  Patient can be reached at [phone]

## 2024-03-04 ENCOUNTER — Ambulatory Visit (INDEPENDENT_AMBULATORY_CARE_PROVIDER_SITE_OTHER)

## 2024-03-04 DIAGNOSIS — E538 Deficiency of other specified B group vitamins: Secondary | ICD-10-CM | POA: Diagnosis not present

## 2024-03-04 MED ORDER — CYANOCOBALAMIN 1000 MCG/ML IJ SOLN
1000.0000 ug | Freq: Once | INTRAMUSCULAR | Status: AC
  Administered 2024-03-04: 1000 ug via INTRAMUSCULAR

## 2024-03-05 ENCOUNTER — Ambulatory Visit

## 2024-03-10 ENCOUNTER — Other Ambulatory Visit: Payer: Self-pay | Admitting: Family Medicine

## 2024-03-10 ENCOUNTER — Telehealth: Payer: Self-pay | Admitting: Pharmacist

## 2024-03-10 ENCOUNTER — Other Ambulatory Visit (INDEPENDENT_AMBULATORY_CARE_PROVIDER_SITE_OTHER): Admitting: Pharmacist

## 2024-03-10 DIAGNOSIS — I700 Atherosclerosis of aorta: Secondary | ICD-10-CM

## 2024-03-10 DIAGNOSIS — E66811 Obesity, class 1: Secondary | ICD-10-CM

## 2024-03-10 MED ORDER — WEGOVY 1 MG/0.5ML ~~LOC~~ SOAJ: 1.0000 mg | SUBCUTANEOUS | 2 refills | Status: DC

## 2024-03-10 NOTE — Patient Instructions (Signed)
 Goals Addressed             This Visit's Progress    Pharmacy Goals       [phone] may cause stomach upset, queasiness, or constipation, especially when first starting. This generally improves over time. Call our office if these symptoms occur and worsen, or if you have severe symptoms such as vomiting, diarrhea, or stomach pain.   Arthur Lash, PharmD, [facility] Clinical Pharmacist [facility] [phone]

## 2024-03-10 NOTE — Telephone Encounter (Addendum)
   Outreach Note  03/10/2024 Name: Jennifer Hayes MRN: [id number] DOB: 12-02-1940  Referred by: Raina Bunting, DO  Was unable to reach patient via telephone today and have left HIPAA compliant voicemail asking patient to return my call.     Arthur Lash, PharmD, Becky Bowels, CPP Clinical Pharmacist [facility] [phone]

## 2024-03-10 NOTE — Progress Notes (Signed)
 03/10/2024 Name: Jennifer Hayes MRN: [id number] DOB: April 05, 1941  Chief Complaint  Patient presents with   Medication Management    Jennifer Hayes is a 83 y.o. year old female who presented for a telephone visit.   They were referred to the pharmacist by their PCP for assistance in managing medication access.      Subjective:   Care Team: Primary Care Provider: Raina Bunting, DO; Next Scheduled Visit: 05/12/2024  Medication Access/Adherence  Current Pharmacy:  [facility] - [place], [place] - [address] [facility] [place] [address] [place] [address] [place] [address] Phone: [phone] Fax: [phone]  [id number] [place], [place] - [address] [place] [address] Phone: [phone] Fax: [phone]   Patient reports affordability concerns with their medications: No  Patient reports access/transportation concerns to their pharmacy: No  Patient reports adherence concerns with their medications:  No       Obesity, Complicated by Aortic atherosclerosis:   Current medications: Wegovy  0.5 mg once weekly on Mondays (increased to current dose ~4 weeks ago)   Reports tolerating well. Denies side effects. Requesting to increase Wegovy  dose  Reports last checked weight at home today: 155 lbs   Reports has noticed some improvement in her appetite control since started Wegovy , but still having difficulty with appetite control.    Reports making positive dietary changes, including reducing sugary beverage (Pepsi) consumption and paying more attention to what she eats.  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 Plans to start walking more with weight loss     Objective:  Lab Results  Component Value Date   HGBA1C 5.5 02/20/2024    Lab Results  Component Value Date   CREATININE 0.59 (L) 02/20/2024   BUN 13 02/20/2024   NA 140  02/20/2024   K 3.4 (L) 02/20/2024   CL 103 02/20/2024   CO2 31 02/20/2024     Medications Reviewed Today     Reviewed by Ardis Becton, RPH-CPP (Pharmacist) on 03/10/24 at 1018  Med List Status: <None>   Medication Order Taking? Sig Documenting Provider Last Dose Status Informant  albuterol  (VENTOLIN  HFA) 108 (90 Base) MCG/ACT inhaler [id number]  Inhale 2 puffs into the lungs every 6 (six) hours as needed for wheezing or shortness of breath. Verla Glaze, MD  Active Self  celecoxib  (CELEBREX ) 200 MG capsule [id number]  Take 1 capsule (200 mg total) by mouth daily. Raina Bunting, DO  Active   citalopram  (CELEXA ) 40 MG tablet [id number]  Take 1 tablet (40 mg total) by mouth daily. Raina Bunting, DO  Active   cyanocobalamin  (VITAMIN B12) 1000 MCG/ML injection [id number]  Inject 1 mL (1,000 mcg total) into the muscle once a week. For 4 weeks Raina Bunting, DO  Active   esomeprazole  (NEXIUM ) 40 MG capsule [id number]  Take 1 capsule (40 mg total) by mouth daily before breakfast. Raina Bunting, DO  Active   fluticasone  furoate-vilanterol (BREO ELLIPTA ) 100-25 MCG/ACT AEPB [id number]  Inhale 1 puff into the lungs daily. Vergia Glasgow, MD  Active            Med Note Langston Pippins, Deanna Expose   Fri Dec 07, 2023  9:38 AM) TAKES PRN  ipratropium-albuterol  (DUONEB) 0.5-2.5 (3) MG/3ML SOLN [id number]  Take 3 mLs by nebulization every 6 (six) hours as needed. Vergia Glasgow, MD  Active   rosuvastatin  (CRESTOR ) 10 MG  tablet [id number]  Take 1 tablet (10 mg total) by mouth at bedtime. Raina Bunting, DO  Active   Tiotropium Bromide Monohydrate  (SPIRIVA  RESPIMAT) 2.5 MCG/ACT AERS [id number]  Inhale 2 puffs into the lungs daily. Vergia Glasgow, MD  Active            Med Note Langston Pippins, Deanna Expose   Fri Dec 07, 2023  9:38 AM) TAKES PRN  traZODone  (DESYREL ) 50 MG tablet [id number]  Take 1 tablet (50 mg total) by mouth at bedtime as needed. for sleep Raina Bunting, DO  Active   WEGOVY  0.5 MG/0.5ML [place] [id number] Yes Inject 0.5 mg into the skin once a week. Raina Bunting, DO Taking Active               Assessment/Plan:   Obesity: - Provide dietary counseling including education on focus on lean proteins, fruits and vegetables, whole grains and increased fiber consumption, adequate hydration - Ability to exercise limited by pain - Collaborate with PCP regarding patient's request to increase in dose of Wegovy  to 1 mg once weekly through [facility]  PCP sends prescription to pharmacy for patient     Follow Up Plan: Clinical Pharmacist will follow up with patient by telephone in September   Arthur Lash, PharmD, [facility] Clinical Pharmacist [facility] [phone]

## 2024-03-12 ENCOUNTER — Telehealth: Payer: Self-pay

## 2024-03-12 ENCOUNTER — Ambulatory Visit (INDEPENDENT_AMBULATORY_CARE_PROVIDER_SITE_OTHER)

## 2024-03-12 DIAGNOSIS — J431 Panlobular emphysema: Secondary | ICD-10-CM

## 2024-03-12 DIAGNOSIS — E538 Deficiency of other specified B group vitamins: Secondary | ICD-10-CM

## 2024-03-12 DIAGNOSIS — J449 Chronic obstructive pulmonary disease, unspecified: Secondary | ICD-10-CM

## 2024-03-12 MED ORDER — COMBIVENT RESPIMAT 20-100 MCG/ACT IN AERS: 1.0000 | INHALATION_SPRAY | Freq: Four times a day (QID) | RESPIRATORY_TRACT | 3 refills | Status: AC | PRN

## 2024-03-12 MED ORDER — CYANOCOBALAMIN 1000 MCG/ML IJ SOLN
1000.0000 ug | Freq: Once | INTRAMUSCULAR | Status: AC
  Administered 2024-03-12: 1000 ug via INTRAMUSCULAR

## 2024-03-12 NOTE — Addendum Note (Signed)
 Addended by: Raina Bunting on: 03/12/2024 10:27 AM   Modules accepted: Orders

## 2024-03-12 NOTE — Telephone Encounter (Signed)
 Refill on combivent inhaler, not on med list, To the [place]

## 2024-03-17 ENCOUNTER — Telehealth: Payer: Self-pay | Admitting: Family Medicine

## 2024-03-17 NOTE — Telephone Encounter (Signed)
 We received fax orders from "[employer]" for a medical equipment orders to be signed. The company sent us  orders for a Knee Brace anda  Back Brace.  Can you clarify with patient if she actually ordered these and needs them?   She would need a face to face visit to document these orders before we can sign these orders. Or we can do this at her next visit in August if she prefers.  Domingo Friend, DO [facility] 03/17/2024, 5:34 PM

## 2024-03-18 NOTE — Telephone Encounter (Signed)
 Left message for patient to return call OK to advise

## 2024-03-19 ENCOUNTER — Ambulatory Visit

## 2024-03-19 ENCOUNTER — Ambulatory Visit (INDEPENDENT_AMBULATORY_CARE_PROVIDER_SITE_OTHER)

## 2024-03-19 DIAGNOSIS — E538 Deficiency of other specified B group vitamins: Secondary | ICD-10-CM | POA: Diagnosis not present

## 2024-03-19 MED ORDER — CYANOCOBALAMIN 1000 MCG/ML IJ SOLN
1000.0000 ug | Freq: Once | INTRAMUSCULAR | Status: AC
  Administered 2024-03-19: 1000 ug via INTRAMUSCULAR

## 2024-03-24 ENCOUNTER — Telehealth: Payer: Self-pay

## 2024-03-24 NOTE — Telephone Encounter (Signed)
 Spoke with [place], she does not need the braces, we can disregard the paperwork

## 2024-03-26 ENCOUNTER — Ambulatory Visit (INDEPENDENT_AMBULATORY_CARE_PROVIDER_SITE_OTHER)

## 2024-03-26 DIAGNOSIS — E538 Deficiency of other specified B group vitamins: Secondary | ICD-10-CM

## 2024-03-26 MED ORDER — CYANOCOBALAMIN 1000 MCG/ML IJ SOLN
1000.0000 ug | Freq: Once | INTRAMUSCULAR | Status: AC
  Administered 2024-03-26: 1000 ug via INTRAMUSCULAR

## 2024-04-04 ENCOUNTER — Other Ambulatory Visit (INDEPENDENT_AMBULATORY_CARE_PROVIDER_SITE_OTHER): Admitting: Pharmacist

## 2024-04-04 DIAGNOSIS — E66811 Obesity, class 1: Secondary | ICD-10-CM

## 2024-04-04 NOTE — Progress Notes (Signed)
 04/04/2024 Name: Jennifer Hayes MRN: [id number] DOB: 1941-01-20  Chief Complaint  Patient presents with   Medication Management    Jennifer Hayes is a 83 y.o. year old female who was referred to the pharmacist by their PCP for assistance in managing medication access.    Receive a voicemail from patient requesting a call back. Return call to patient today.   Subjective:   Care Team: Primary Care Provider: Edman Marsa PARAS, DO; Next Scheduled Visit: 05/12/2024    Medication Access/Adherence  Current Pharmacy:  [facility] - [place], [place] - [address] [facility] [address] [place] [address] [place] [address] Phone: [phone] Fax: [phone]  [id number] [place], [place] - [address] [place] [address] Phone: [phone] Fax: [phone]   Patient reports affordability concerns with their medications: No  Patient reports access/transportation concerns to their pharmacy: No  Patient reports adherence concerns with their medications:  No       Obesity, Complicated by Aortic atherosclerosis:   Current medications: Wegovy  1 mg once weekly on Fridays (increased to current dose ~6/13)   Reports tolerating well. 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      Objective:  Lab Results  Component Value Date   HGBA1C 5.5 02/20/2024    Lab Results  Component Value Date   CREATININE 0.59 (L) 02/20/2024   BUN 13 02/20/2024   NA 140 02/20/2024   K 3.4 (L) 02/20/2024   CL 103 02/20/2024   CO2 31 02/20/2024    Current Outpatient Medications on File Prior to Visit  Medication Sig  Dispense Refill   albuterol  (VENTOLIN  HFA) 108 (90 Base) MCG/ACT inhaler Inhale 2 puffs into the lungs every 6 (six) hours as needed for wheezing or shortness of breath. 8 g 2   celecoxib  (CELEBREX ) 200 MG capsule Take 1 capsule (200 mg total) by mouth daily. 90 capsule 3   citalopram  (CELEXA ) 40 MG tablet Take 1 tablet (40 mg total) by mouth daily. 90 tablet 3   cyanocobalamin  (VITAMIN B12) 1000 MCG/ML injection Inject 1 mL (1,000 mcg total) into the muscle once a week. For 4 weeks 4 mL 0   esomeprazole  (NEXIUM ) 40 MG capsule Take 1 capsule (40 mg total) by mouth daily before breakfast. 90 capsule 3   fluticasone  furoate-vilanterol (BREO ELLIPTA ) 100-25 MCG/ACT AEPB Inhale 1 puff into the lungs daily. 1 each 11   Ipratropium-Albuterol  (COMBIVENT  RESPIMAT) 20-100 MCG/ACT AERS respimat Inhale 1 puff into the lungs every 6 (six) hours as needed for wheezing. 12 g 3   ipratropium-albuterol  (DUONEB) 0.5-2.5 (3) MG/3ML SOLN Take 3 mLs by nebulization every 6 (six) hours as needed.     rosuvastatin  (CRESTOR ) 10 MG tablet Take 1 tablet (10 mg total) by mouth at bedtime. 90 tablet 3   Tiotropium Bromide Monohydrate  (SPIRIVA  RESPIMAT) 2.5 MCG/ACT AERS Inhale 2 puffs into the lungs daily. 1 each 11   traZODone  (DESYREL ) 50 MG tablet Take 1 tablet (50 mg total) by mouth at bedtime as needed. for sleep 90 tablet 1   WEGOVY  1 MG/0.5ML SOAJ Inject 1 mg into the skin once a week. 2 mL  2   No current facility-administered medications on file prior to visit.        Assessment/Plan:   Obesity: - Review dietary counseling including education on focus on lean proteins, fruits and vegetables, whole grains and increased fiber consumption, adequate hydration - Ability to exercise limited by pain - Patient to contact [facility] today to request refill of her Wegovy  prescription   Follow Up Plan: Clinical Pharmacist will follow up with patient by telephone in September   Sharyle Sia, PharmD, [facility] Clinical  Pharmacist [facility] [phone]

## 2024-04-04 NOTE — Patient Instructions (Signed)
 Goals Addressed             This Visit's Progress    Pharmacy Goals       Wegovy  may cause stomach upset, queasiness, or constipation, especially when first starting. This generally improves over time. Call our office if these symptoms occur and worsen, or if you have severe symptoms such as vomiting, diarrhea, or stomach pain.   Please follow up with [facility] by calling [phone] for the refill of your Wegovy   Sharyle Sia, PharmD, [facility] Clinical Pharmacist [facility] [phone]

## 2024-05-12 ENCOUNTER — Encounter: Payer: Self-pay | Admitting: Family Medicine

## 2024-05-12 ENCOUNTER — Ambulatory Visit (INDEPENDENT_AMBULATORY_CARE_PROVIDER_SITE_OTHER): Admitting: Family Medicine

## 2024-05-12 ENCOUNTER — Other Ambulatory Visit: Payer: Self-pay | Admitting: Family Medicine

## 2024-05-12 VITALS — BP 122/68 | HR 81 | Ht 62.0 in | Wt 143.1 lb

## 2024-05-12 DIAGNOSIS — I700 Atherosclerosis of aorta: Secondary | ICD-10-CM | POA: Diagnosis not present

## 2024-05-12 DIAGNOSIS — E663 Overweight: Secondary | ICD-10-CM

## 2024-05-12 DIAGNOSIS — J431 Panlobular emphysema: Secondary | ICD-10-CM

## 2024-05-12 DIAGNOSIS — F5101 Primary insomnia: Secondary | ICD-10-CM

## 2024-05-12 DIAGNOSIS — F411 Generalized anxiety disorder: Secondary | ICD-10-CM

## 2024-05-12 DIAGNOSIS — F3342 Major depressive disorder, recurrent, in full remission: Secondary | ICD-10-CM

## 2024-05-12 DIAGNOSIS — Z0000 Encounter for general adult medical examination without abnormal findings: Secondary | ICD-10-CM

## 2024-05-12 DIAGNOSIS — E538 Deficiency of other specified B group vitamins: Secondary | ICD-10-CM

## 2024-05-12 DIAGNOSIS — E782 Mixed hyperlipidemia: Secondary | ICD-10-CM

## 2024-05-12 DIAGNOSIS — R7309 Other abnormal glucose: Secondary | ICD-10-CM

## 2024-05-12 MED ORDER — WEGOVY 1 MG/0.5ML ~~LOC~~ SOAJ: 1.0000 mg | SUBCUTANEOUS | 2 refills | Status: DC

## 2024-05-12 NOTE — Progress Notes (Signed)
 Subjective:    Patient ID: Jennifer Hayes, female    DOB: [birth year], 83 y.o.   MRN: [id number]  Parish Augustine is a 83 y.o. female presenting on 05/12/2024 for Medical Management of Chronic Issues   HPI  Discussed the use of AI scribe software for clinical note transcription with the patient, who gave verbal consent to proceed.  History of Present Illness   Ariyel Jeangilles is an 83 year old female who presents for a weight check and follow-up on anxiety symptoms.  Obesity / Overweight / Weight Loss - Weight decreased from 165 pounds in February to 143 pounds currently - Currently taking Wegovy  1 mg weekly - Weight loss has resulted in improvement of back pain  Anxiety symptoms - Episodes of feeling 'real nervous' without identifiable triggers - Associated symptoms include diaphoresis and tremulousness - Currently taking citalopram  40 mg daily for anxiety - No recent use of other medications specifically for anxiety - Not ready to change med  Insomnia / Sleep disturbance - Difficulty sleeping occurs a couple of times per week - Uses trazodone  as needed for sleep  Chronic back pain - History of three prior back surgeries with placement of rods - Back pain is less bothersome following recent weight loss     Aortic Atherosclerosis      12/07/2023    9:40 AM 08/06/2023   11:28 PM 04/28/2022   10:05 AM  Depression screen PHQ 2/9  Decreased Interest 0  0  Down, Depressed, Hopeless 0 2 0  PHQ - 2 Score 0 2 0  Altered sleeping 0 1 0  Tired, decreased energy 0 1 0  Change in appetite 0 1 0  Feeling bad or failure about yourself  0 1 0  Trouble concentrating 0  0  Moving slowly or fidgety/restless 0 1 0  Suicidal thoughts 0 0 0  PHQ-9 Score 0 7 0  Difficult doing work/chores Not difficult at all Very difficult        10/31/2021   10:42 AM  GAD 7 : Generalized Anxiety Score  Nervous, Anxious, on Edge 0  Control/stop worrying 0  Worry too  much - different things 0  Trouble relaxing 0  Restless 0  Easily annoyed or irritable 0  Afraid - awful might happen 0  Total GAD 7 Score 0    Social History   Tobacco Use   Smoking status: Every Day    Current packs/day: 1.00    Average packs/day: 1 pack/day for 50.0 years (50.0 ttl pk-yrs)    Types: Cigarettes   Smokeless tobacco: Never  Vaping Use   Vaping status: Some Days  Substance Use Topics   Alcohol use: Not Currently    Comment: occasionally   Drug use: Never    Review of Systems Per HPI unless specifically indicated above     Objective:    BP 122/68 (BP Location: Left Arm, Patient Position: Sitting, Cuff Size: Normal)   Pulse 81   Ht 5' 2 (1.575 m)   Wt 143 lb 2 oz (64.9 kg)   SpO2 99%   BMI 26.18 kg/m   Wt Readings from Last 3 Encounters:  05/12/24 143 lb 2 oz (64.9 kg)  11/26/23 164 lb (74.4 kg)  11/19/23 165 lb (74.8 kg)    Physical Exam Vitals and nursing note reviewed.  Constitutional:      General: She is not in acute distress.    Appearance: Normal appearance. She is well-developed. She is not  diaphoretic.     Comments: Well-appearing, comfortable, cooperative  HENT:     Head: Normocephalic and atraumatic.  Eyes:     General:        Right eye: No discharge.        Left eye: No discharge.     Conjunctiva/sclera: Conjunctivae normal.  Cardiovascular:     Rate and Rhythm: Normal rate.  Pulmonary:     Effort: Pulmonary effort is normal.  Skin:    General: Skin is warm and dry.     Findings: No erythema or rash.  Neurological:     Mental Status: She is alert and oriented to person, place, and time.  Psychiatric:        Mood and Affect: Mood normal.        Behavior: Behavior normal.        Thought Content: Thought content normal.     Comments: Well groomed, good eye contact, normal speech and thoughts     Results for orders placed or performed in visit on 02/18/24  Vitamin B12   Collection Time: 02/20/24  8:48 AM  Result Value  Ref Range   Vitamin B-12 188 (L) 200 - 1,100 pg/mL  CBC with Differential/Platelet   Collection Time: 02/20/24  8:48 AM  Result Value Ref Range   WBC 7.5 3.8 - 10.8 Thousand/uL   RBC 4.46 3.80 - 5.10 Million/uL   Hemoglobin 13.3 11.7 - 15.5 g/dL   HCT 58.9 64.9 - 54.9 %   MCV 91.9 80.0 - 100.0 fL   MCH 29.8 27.0 - 33.0 pg   MCHC 32.4 32.0 - 36.0 g/dL   RDW 86.7 88.9 - 84.9 %   Platelets 220 140 - 400 Thousand/uL   MPV 10.3 7.5 - 12.5 fL   Neutro Abs 4,328 1,500 - 7,800 cells/uL   Absolute Lymphocytes 2,595 850 - 3,900 cells/uL   Absolute Monocytes 398 200 - 950 cells/uL   Eosinophils Absolute 120 15 - 500 cells/uL   Basophils Absolute 60 0 - 200 cells/uL   Neutrophils Relative % 57.7 %   Total Lymphocyte 34.6 %   Monocytes Relative 5.3 %   Eosinophils Relative 1.6 %   Basophils Relative 0.8 %  COMPLETE METABOLIC PANEL WITH GFR   Collection Time: 02/20/24  8:48 AM  Result Value Ref Range   Glucose, Bld 90 65 - 99 mg/dL   BUN 13 7 - 25 mg/dL   Creat 9.40 (L) 9.39 - 0.95 mg/dL   BUN/Creatinine Ratio 22 6 - 22 (calc)   Sodium 140 135 - 146 mmol/L   Potassium 3.4 (L) 3.5 - 5.3 mmol/L   Chloride 103 98 - 110 mmol/L   CO2 31 20 - 32 mmol/L   Calcium  8.9 8.6 - 10.4 mg/dL   Total Protein 5.9 (L) 6.1 - 8.1 g/dL   Albumin 4.0 3.6 - 5.1 g/dL   Globulin 1.9 1.9 - 3.7 g/dL (calc)   AG Ratio 2.1 1.0 - 2.5 (calc)   Total Bilirubin 0.4 0.2 - 1.2 mg/dL   Alkaline phosphatase (APISO) 55 37 - 153 U/L   AST 8 (L) 10 - 35 U/L   ALT 5 (L) 6 - 29 U/L  Hemoglobin A1c   Collection Time: 02/20/24  8:48 AM  Result Value Ref Range   Hgb A1c MFr Bld 5.5 <5.7 %   Mean Plasma Glucose 111 mg/dL   eAG (mmol/L) 6.2 mmol/L  Lipid panel   Collection Time: 02/20/24  8:48 AM  Result Value Ref  Range   Cholesterol 241 (H) <200 mg/dL   HDL 47 (L) > OR = 50 mg/dL   Triglycerides 824 (H) <150 mg/dL   LDL Cholesterol (Calc) 162 (H) mg/dL (calc)   Total CHOL/HDL Ratio 5.1 (H) <5.0 (calc)   Non-HDL  Cholesterol (Calc) 194 (H) <130 mg/dL (calc)  TSH   Collection Time: 02/20/24  8:48 AM  Result Value Ref Range   TSH 3.01 0.40 - 4.50 mIU/L      Assessment & Plan:   Problem List Items Addressed This Visit     Insomnia   Major depression, recurrent, full remission (HCC)   Overweight (BMI 25.0-29.9)   Panlobular emphysema (HCC)   Other Visit Diagnoses       Aortic atherosclerosis (HCC)    -  Primary   Relevant Medications   WEGOVY  1 MG/0.5ML SOAJ     GAD (generalized anxiety disorder)           Obesity / Overweight BMI >26 Significant weight loss with Wegovy  from 165 to 143 pounds. Reduced back pain likely due to weight loss. Wegovy  1 mg effective and well-tolerated. - Continue Wegovy  1 mg weekly. - Order refills for Wegovy  through [facility].  Episodes of anxiety Intermittent anxiety managed with citalopram  40 mg daily - Continue citalopram  40 mg daily. - Consider Buspar as needed for anxiety if she decides to pursue AS NEEDED medication option - Encourage non-pharmacological strategies for anxiety management.  General Health Maintenance Routine follow-up and monitoring of health status. - Schedule annual wellness visit in early February. - Perform blood work prior to annual visit.       No orders of the defined types were placed in this encounter.   Meds ordered this encounter  Medications   WEGOVY  1 MG/0.5ML SOAJ    Sig: Inject 1 mg into the skin once a week.    Dispense:  2 mL    Refill:  2    Diagnosis I70, Add refills    Follow up plan: Return in about 6 months (around 11/12/2024) for 6 month fasting lab > 1 week later Annual Physical.  Future labs ordered for 11/12/24   Marsa Officer, DO [facility] 05/12/2024, 8:52 AM

## 2024-05-12 NOTE — Patient Instructions (Addendum)
 Thank you for coming to the office today.  Weight down 20+ lbs in 6 months.  Keep on Wegovy  1mg  weekly, sent to [facility]  Consider anxiety medication Buspar as needed in the future  Continue Citalopram  for daily anxiety/mood  Continue Trazodone  as needed for sleep insomnia  DUE for FASTING BLOOD WORK (no food or drink after midnight before the lab appointment, only water  or coffee without cream/sugar on the morning of)  SCHEDULE Lab Only visit in the morning at the clinic for lab draw in 6 MONTHS   - Make sure Lab Only appointment is at about 1 week before your next appointment, so that results will be available  For Lab Results, once available within 2-3 days of blood draw, you can can log in to MyChart online to view your results and a brief explanation. Also, we can discuss results at next follow-up visit.   Please schedule a Follow-up Appointment to: Return in about 6 months (around 11/12/2024) for 6 month fasting lab > 1 week later Annual Physical.  If you have any other questions or concerns, please feel free to call the office or send a message through MyChart. You may also schedule an earlier appointment if necessary.  Additionally, you may be receiving a survey about your experience at our office within a few days to 1 week by e-mail or mail. We value your feedback.  Marsa Officer, DO [facility], [place]

## 2024-06-16 ENCOUNTER — Other Ambulatory Visit (INDEPENDENT_AMBULATORY_CARE_PROVIDER_SITE_OTHER): Admitting: Pharmacist

## 2024-06-16 DIAGNOSIS — I700 Atherosclerosis of aorta: Secondary | ICD-10-CM

## 2024-06-16 NOTE — Progress Notes (Signed)
 06/16/2024 Name: Jennifer Hayes MRN: [id number] DOB: [birth year]  Chief Complaint  Patient presents with   Medication Management    Sherald Balbuena is a 83 y.o. year old female who presented for a telephone visit.   They were referred to the pharmacist by their PCP for assistance in managing medication access.    Subjective:  Care Team: Primary Care Provider: Edman Marsa PARAS, DO ; Next Scheduled Visit: 11/19/2024   Medication Access/Adherence  Current Pharmacy:  [facility] - [place], [place] - [id number] [facility] [address] [place] [address] [place] [address] Phone: [phone] Fax: [phone]  [id number] - [place], [place] - [address] [place] [address] Phone: [phone] Fax: [phone]   Patient reports affordability concerns with their medications: No  Patient reports access/transportation concerns to their pharmacy: No  Patient reports adherence concerns with their medications:  No       Obesity, Complicated by Aortic atherosclerosis:   Current medications: Wegovy  1 mg once weekly   Reports tolerating well. 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    Objective:  Lab Results  Component Value Date   HGBA1C 5.5 02/20/2024    Lab Results  Component Value Date   CREATININE 0.59 (L) 02/20/2024   BUN 13 02/20/2024   NA 140 02/20/2024   K 3.4 (L) 02/20/2024   CL 103 02/20/2024   CO2 31 02/20/2024      Current Outpatient Medications on File Prior to Visit  Medication Sig Dispense Refill   albuterol  (VENTOLIN  HFA) 108 (90 Base) MCG/ACT inhaler Inhale 2 puffs into the lungs every 6 (six) hours as needed for wheezing or shortness of  breath. 8 g 2   celecoxib  (CELEBREX ) 200 MG capsule Take 1 capsule (200 mg total) by mouth daily. 90 capsule 3   citalopram  (CELEXA ) 40 MG tablet Take 1 tablet (40 mg total) by mouth daily. 90 tablet 3   cyanocobalamin  (VITAMIN B12) 1000 MCG/ML injection Inject 1 mL (1,000 mcg total) into the muscle once a week. For 4 weeks (Patient not taking: Reported on 05/12/2024) 4 mL 0   esomeprazole  (NEXIUM ) 40 MG capsule Take 1 capsule (40 mg total) by mouth daily before breakfast. 90 capsule 3   fluticasone  furoate-vilanterol (BREO ELLIPTA ) 100-25 MCG/ACT AEPB Inhale 1 puff into the lungs daily. (Patient not taking: Reported on 05/12/2024) 1 each 11   Ipratropium-Albuterol  (COMBIVENT  RESPIMAT) 20-100 MCG/ACT AERS respimat Inhale 1 puff into the lungs every 6 (six) hours as needed for wheezing. 12 g 3   ipratropium-albuterol  (DUONEB) 0.5-2.5 (3) MG/3ML SOLN Take 3 mLs by nebulization every 6 (six) hours as needed.     rosuvastatin  (CRESTOR ) 10 MG tablet Take 1 tablet (10 mg total) by mouth at bedtime. 90 tablet 3   Tiotropium Bromide Monohydrate  (SPIRIVA  RESPIMAT) 2.5 MCG/ACT AERS Inhale 2 puffs into the lungs daily. (Patient not taking: Reported on 05/12/2024) 1 each 11   traZODone  (DESYREL ) 50 MG tablet Take 1 tablet (50 mg total) by mouth at bedtime as needed. for sleep 90 tablet 1   WEGOVY  1 MG/0.5ML SOAJ Inject 1 mg into the skin once a week. 2 mL 2   No current facility-administered medications on  file prior to visit.        Assessment/Plan:   Obesity: - Review dietary counseling including encourage patient to have well-balanced meals spread throughout the day. Encourage patient to avoid skipping meals - Ability to exercise limited by pain - Patient to follow up with [facility] as needed to request refills of her Wegovy  prescription   Follow Up Plan:   Patient denies further medication questions or concerns today Provide patient with contact information for clinic pharmacist to contact if needed in  future for medication questions/concerns    Sharyle Sia, PharmD, [facility] Clinical Pharmacist [facility] [phone]

## 2024-06-16 NOTE — Patient Instructions (Signed)
 Goals Addressed             This Visit's Progress    Pharmacy Goals       Please follow up with [facility] by calling [phone] for as needed for your prescription refills  Sharyle Sia, PharmD, [facility] Clinical Pharmacist [facility] [phone]

## 2024-06-30 ENCOUNTER — Ambulatory Visit: Payer: Self-pay | Admitting: *Deleted

## 2024-06-30 NOTE — Telephone Encounter (Signed)
 FYI Only or Action Required?: FYI only for provider.  Patient was last seen in primary care on 05/12/2024 by Edman Marsa PARAS, DO.  Called Nurse Triage reporting Headache.  Symptoms began several days ago.  Interventions attempted: Nothing.  Symptoms are: gradually worsening.  Triage Disposition: Go to ED Now (Notify PCP)  Patient/caregiver understands and will follow disposition?: yes     . Reason for Disposition  [1] SEVERE headache AND [2] sudden-onset (i.e., reaching maximum intensity within seconds to 1 hour)    Although lasting only seconds- coming more frequently- at least twice during call with agent and nurse.  Answer Assessment - Initial Assessment Questions 1. LOCATION: Where does it hurt?      Left side above ear 2. ONSET: When did the headache start? (e.g., minutes, hours, days)      2 days 3. PATTERN: Does the pain come and go, or has it been constant since it started?     Sharp pain- comes and goes- lasts seconds 4. SEVERITY: How bad is the pain? and What does it keep you from doing?  (e.g., Scale 1-10; mild, moderate, or severe)     8/10 5. RECURRENT SYMPTOM: Have you ever had headaches before? If Yes, ask: When was the last time? and What happened that time?      no 6. CAUSE: What do you think is causing the headache?     unsure 7. MIGRAINE: Have you been diagnosed with migraine headaches? If Yes, ask: Is this headache similar?      no 8. HEAD INJURY: Has there been any recent injury to your head?      no 9. OTHER SYMPTOMS: Do you have any other symptoms? (e.g., fever, stiff neck, eye pain, sore throat, cold symptoms)     no  Protocols used: Headache-A-AH   Copied from CRM #[id number]. Topic: Clinical - Red Word Triage >> Jun 30, 2024  2:07 PM Selinda RAMAN wrote: Red Word that prompted transfer to Nurse Triage: The patient called in stating for the last couple of days she gets a very sharp pain that hits the left side of her  head. I will transfer her to [facility]

## 2024-10-20 ENCOUNTER — Other Ambulatory Visit: Payer: Self-pay | Admitting: Family Medicine

## 2024-10-20 DIAGNOSIS — I700 Atherosclerosis of aorta: Secondary | ICD-10-CM

## 2024-10-21 NOTE — Telephone Encounter (Signed)
 Requested medication (s) are due for refill today: Yes  Requested medication (s) are on the active medication list: Yes  Last refill:  05/14/24  Future visit scheduled: Yes  Notes to clinic:  See pharmacy request.    Requested Prescriptions  Pending Prescriptions Disp Refills   WEGOVY  1 MG/0.5ML SOAJ SQ injection [Pharmacy Med Name: WEGOVY  1 MG/0.5ML Solution Auto-injector] 2 mL 2    Sig: Inject 1 mg into the skin once a week.     Endocrinology:  Diabetes - GLP-1 Receptor Agonists - semaglutide  Failed - 10/21/2024  1:09 PM      Failed - HBA1C in normal range and within 180 days    Hgb A1c MFr Bld  Date Value Ref Range Status  02/20/2024 5.5 <5.7 % Final    Comment:    For the purpose of screening for the presence of diabetes: . <5.7%       Consistent with the absence of diabetes 5.7-6.4%    Consistent with increased risk for diabetes             (prediabetes) > or =6.5%  Consistent with diabetes . This assay result is consistent with a decreased risk of diabetes. . Currently, no consensus exists regarding use of hemoglobin A1c for diagnosis of diabetes in children. . According to American Diabetes Association (ADA) guidelines, hemoglobin A1c <7.0% represents optimal control in non-pregnant diabetic patients. Different metrics may apply to specific patient populations.  Standards of Medical Care in Diabetes(ADA). .          Failed - Cr in normal range and within 360 days    Creat  Date Value Ref Range Status  02/20/2024 0.59 (L) 0.60 - 0.95 mg/dL Final         Passed - Valid encounter within last 6 months    Recent Outpatient Visits           5 months ago Aortic atherosclerosis   [place] [facility] Edman Marsa PARAS, [place]   11 months ago Aortic atherosclerosis   [place] [facility] Edman Marsa PARAS, [place]   11 months ago Major depressive disorder, recurrent, moderate [facility])   [facility] [place], Marsa PARAS, [place]

## 2024-11-12 ENCOUNTER — Other Ambulatory Visit

## 2024-11-12 DIAGNOSIS — Z0000 Encounter for general adult medical examination without abnormal findings: Secondary | ICD-10-CM

## 2024-11-12 DIAGNOSIS — E782 Mixed hyperlipidemia: Secondary | ICD-10-CM

## 2024-11-12 DIAGNOSIS — E538 Deficiency of other specified B group vitamins: Secondary | ICD-10-CM

## 2024-11-12 DIAGNOSIS — J431 Panlobular emphysema: Secondary | ICD-10-CM

## 2024-11-12 DIAGNOSIS — I700 Atherosclerosis of aorta: Secondary | ICD-10-CM

## 2024-11-12 DIAGNOSIS — R7309 Other abnormal glucose: Secondary | ICD-10-CM

## 2024-11-19 ENCOUNTER — Encounter: Admitting: Family Medicine

## 2024-12-12 ENCOUNTER — Ambulatory Visit

## 2024-12-17 ENCOUNTER — Ambulatory Visit
